# Patient Record
Sex: Female | Born: 1935
Health system: Southern US, Community
[De-identification: ages and names within clinical notes are randomized; demographics above are authoritative.]

## PROBLEM LIST (undated history)

## (undated) DIAGNOSIS — R519 Headache, unspecified: Secondary | ICD-10-CM

## (undated) DIAGNOSIS — K219 Gastro-esophageal reflux disease without esophagitis: Secondary | ICD-10-CM

## (undated) DIAGNOSIS — Z9889 Other specified postprocedural states: Secondary | ICD-10-CM

## (undated) DIAGNOSIS — IMO0001 Reserved for inherently not codable concepts without codable children: Secondary | ICD-10-CM

## (undated) DIAGNOSIS — I2510 Atherosclerotic heart disease of native coronary artery without angina pectoris: Secondary | ICD-10-CM

## (undated) DIAGNOSIS — I10 Essential (primary) hypertension: Secondary | ICD-10-CM

## (undated) DIAGNOSIS — Z8781 Personal history of (healed) traumatic fracture: Secondary | ICD-10-CM

## (undated) DIAGNOSIS — R112 Nausea with vomiting, unspecified: Secondary | ICD-10-CM

## (undated) DIAGNOSIS — M719 Bursopathy, unspecified: Secondary | ICD-10-CM

## (undated) DIAGNOSIS — I340 Nonrheumatic mitral (valve) insufficiency: Secondary | ICD-10-CM

## (undated) DIAGNOSIS — M1990 Unspecified osteoarthritis, unspecified site: Secondary | ICD-10-CM

## (undated) DIAGNOSIS — R51 Headache: Secondary | ICD-10-CM

## (undated) DIAGNOSIS — C50919 Malignant neoplasm of unspecified site of unspecified female breast: Secondary | ICD-10-CM

## (undated) DIAGNOSIS — E785 Hyperlipidemia, unspecified: Secondary | ICD-10-CM

## (undated) DIAGNOSIS — H269 Unspecified cataract: Secondary | ICD-10-CM

## (undated) DIAGNOSIS — K625 Hemorrhage of anus and rectum: Secondary | ICD-10-CM

## (undated) HISTORY — DX: Reserved for inherently not codable concepts without codable children: IMO0001

## (undated) HISTORY — DX: Malignant neoplasm of unspecified site of unspecified female breast: C50.919

## (undated) HISTORY — DX: Hemorrhage of anus and rectum: K62.5

## (undated) HISTORY — PX: DILATION AND CURETTAGE OF UTERUS: SHX78

## (undated) HISTORY — PX: TONSILLECTOMY: SUR1361

## (undated) HISTORY — PX: COLONOSCOPY: SHX174

## (undated) HISTORY — PX: CATARACT EXTRACTION, BILATERAL: SHX1313

## (undated) HISTORY — PX: EYE SURGERY: SHX253

## (undated) HISTORY — DX: Unspecified cataract: H26.9

## (undated) HISTORY — DX: Essential (primary) hypertension: I10

## (undated) HISTORY — DX: Nonrheumatic mitral (valve) insufficiency: I34.0

## (undated) HISTORY — PX: APPENDECTOMY: SHX54

## (undated) HISTORY — DX: Atherosclerotic heart disease of native coronary artery without angina pectoris: I25.10

## (undated) HISTORY — DX: Hyperlipidemia, unspecified: E78.5

---

## 1998-05-01 ENCOUNTER — Other Ambulatory Visit: Admission: RE | Admit: 1998-05-01 | Discharge: 1998-05-01 | Payer: Self-pay | Admitting: Obstetrics and Gynecology

## 1998-11-13 ENCOUNTER — Inpatient Hospital Stay (HOSPITAL_COMMUNITY): Admission: AD | Admit: 1998-11-13 | Discharge: 1998-11-17 | Payer: Self-pay | Admitting: Cardiovascular Disease

## 1998-11-14 ENCOUNTER — Encounter: Payer: Self-pay | Admitting: Cardiovascular Disease

## 1998-11-16 HISTORY — PX: CORONARY ANGIOPLASTY WITH STENT PLACEMENT: SHX49

## 1998-12-15 ENCOUNTER — Encounter (HOSPITAL_COMMUNITY): Admission: RE | Admit: 1998-12-15 | Discharge: 1999-03-15 | Payer: Self-pay | Admitting: Cardiovascular Disease

## 1999-01-27 ENCOUNTER — Encounter: Payer: Self-pay | Admitting: Emergency Medicine

## 1999-01-27 ENCOUNTER — Emergency Department (HOSPITAL_COMMUNITY): Admission: EM | Admit: 1999-01-27 | Discharge: 1999-01-27 | Payer: Self-pay | Admitting: Emergency Medicine

## 1999-03-16 ENCOUNTER — Encounter (HOSPITAL_COMMUNITY): Admission: RE | Admit: 1999-03-16 | Discharge: 1999-06-14 | Payer: Self-pay | Admitting: Cardiovascular Disease

## 1999-05-17 ENCOUNTER — Encounter (HOSPITAL_COMMUNITY): Admission: RE | Admit: 1999-05-17 | Discharge: 1999-08-15 | Payer: Self-pay | Admitting: Cardiovascular Disease

## 1999-05-28 ENCOUNTER — Other Ambulatory Visit: Admission: RE | Admit: 1999-05-28 | Discharge: 1999-05-28 | Payer: Self-pay | Admitting: Obstetrics and Gynecology

## 1999-10-18 ENCOUNTER — Encounter: Payer: Self-pay | Admitting: Obstetrics and Gynecology

## 1999-10-18 ENCOUNTER — Encounter: Admission: RE | Admit: 1999-10-18 | Discharge: 1999-10-18 | Payer: Self-pay | Admitting: Obstetrics and Gynecology

## 1999-11-10 ENCOUNTER — Ambulatory Visit (HOSPITAL_COMMUNITY): Admission: RE | Admit: 1999-11-10 | Discharge: 1999-11-10 | Payer: Self-pay | Admitting: Gastroenterology

## 1999-11-10 ENCOUNTER — Encounter (INDEPENDENT_AMBULATORY_CARE_PROVIDER_SITE_OTHER): Payer: Self-pay | Admitting: Specialist

## 2000-04-17 ENCOUNTER — Encounter: Admission: RE | Admit: 2000-04-17 | Discharge: 2000-04-17 | Payer: Self-pay | Admitting: Endocrinology

## 2000-04-17 ENCOUNTER — Encounter: Payer: Self-pay | Admitting: Endocrinology

## 2000-07-03 ENCOUNTER — Other Ambulatory Visit: Admission: RE | Admit: 2000-07-03 | Discharge: 2000-07-03 | Payer: Self-pay | Admitting: Obstetrics and Gynecology

## 2001-04-19 ENCOUNTER — Encounter: Payer: Self-pay | Admitting: Obstetrics and Gynecology

## 2001-04-19 ENCOUNTER — Encounter: Admission: RE | Admit: 2001-04-19 | Discharge: 2001-04-19 | Payer: Self-pay | Admitting: Obstetrics and Gynecology

## 2001-07-11 ENCOUNTER — Other Ambulatory Visit: Admission: RE | Admit: 2001-07-11 | Discharge: 2001-07-11 | Payer: Self-pay | Admitting: Obstetrics and Gynecology

## 2002-05-22 ENCOUNTER — Encounter: Payer: Self-pay | Admitting: Obstetrics and Gynecology

## 2002-05-22 ENCOUNTER — Encounter: Admission: RE | Admit: 2002-05-22 | Discharge: 2002-05-22 | Payer: Self-pay | Admitting: Obstetrics and Gynecology

## 2002-07-22 ENCOUNTER — Other Ambulatory Visit: Admission: RE | Admit: 2002-07-22 | Discharge: 2002-07-22 | Payer: Self-pay | Admitting: Obstetrics and Gynecology

## 2002-10-25 ENCOUNTER — Encounter: Admission: RE | Admit: 2002-10-25 | Discharge: 2002-10-25 | Payer: Self-pay | Admitting: Endocrinology

## 2002-10-25 ENCOUNTER — Encounter: Payer: Self-pay | Admitting: Endocrinology

## 2003-01-23 ENCOUNTER — Encounter: Payer: Self-pay | Admitting: Endocrinology

## 2003-01-23 ENCOUNTER — Ambulatory Visit (HOSPITAL_COMMUNITY): Admission: RE | Admit: 2003-01-23 | Discharge: 2003-01-23 | Payer: Self-pay | Admitting: Endocrinology

## 2003-06-10 ENCOUNTER — Encounter: Admission: RE | Admit: 2003-06-10 | Discharge: 2003-06-10 | Payer: Self-pay | Admitting: Obstetrics and Gynecology

## 2003-06-10 ENCOUNTER — Encounter: Payer: Self-pay | Admitting: Obstetrics and Gynecology

## 2003-10-28 ENCOUNTER — Other Ambulatory Visit: Admission: RE | Admit: 2003-10-28 | Discharge: 2003-10-28 | Payer: Self-pay | Admitting: Obstetrics and Gynecology

## 2004-06-17 ENCOUNTER — Encounter: Admission: RE | Admit: 2004-06-17 | Discharge: 2004-06-17 | Payer: Self-pay | Admitting: Obstetrics and Gynecology

## 2005-09-01 ENCOUNTER — Encounter: Admission: RE | Admit: 2005-09-01 | Discharge: 2005-09-01 | Payer: Self-pay | Admitting: Obstetrics and Gynecology

## 2006-09-12 ENCOUNTER — Encounter: Admission: RE | Admit: 2006-09-12 | Discharge: 2006-09-12 | Payer: Self-pay | Admitting: Obstetrics and Gynecology

## 2007-09-21 ENCOUNTER — Encounter: Admission: RE | Admit: 2007-09-21 | Discharge: 2007-09-21 | Payer: Self-pay | Admitting: Obstetrics and Gynecology

## 2007-09-28 ENCOUNTER — Encounter: Admission: RE | Admit: 2007-09-28 | Discharge: 2007-09-28 | Payer: Self-pay | Admitting: Obstetrics and Gynecology

## 2008-10-01 ENCOUNTER — Encounter: Admission: RE | Admit: 2008-10-01 | Discharge: 2008-10-01 | Payer: Self-pay | Admitting: Obstetrics and Gynecology

## 2009-07-13 HISTORY — PX: NM MYOCAR PERF WALL MOTION: HXRAD629

## 2009-10-12 ENCOUNTER — Encounter: Admission: RE | Admit: 2009-10-12 | Discharge: 2009-10-12 | Payer: Self-pay | Admitting: Obstetrics and Gynecology

## 2010-09-26 ENCOUNTER — Encounter: Payer: Self-pay | Admitting: Obstetrics and Gynecology

## 2010-09-29 ENCOUNTER — Other Ambulatory Visit: Payer: Self-pay | Admitting: Endocrinology

## 2010-09-29 DIAGNOSIS — Z1231 Encounter for screening mammogram for malignant neoplasm of breast: Secondary | ICD-10-CM

## 2010-09-29 DIAGNOSIS — Z1239 Encounter for other screening for malignant neoplasm of breast: Secondary | ICD-10-CM

## 2010-10-13 ENCOUNTER — Ambulatory Visit
Admission: RE | Admit: 2010-10-13 | Discharge: 2010-10-13 | Disposition: A | Discharge status: Home / Self Care | Payer: Medicare Other | Source: Ambulatory Visit | Attending: Endocrinology | Admitting: Endocrinology

## 2010-10-13 DIAGNOSIS — Z1231 Encounter for screening mammogram for malignant neoplasm of breast: Secondary | ICD-10-CM

## 2010-10-20 ENCOUNTER — Other Ambulatory Visit: Payer: Self-pay | Admitting: Endocrinology

## 2010-10-20 DIAGNOSIS — R928 Other abnormal and inconclusive findings on diagnostic imaging of breast: Secondary | ICD-10-CM

## 2010-11-09 ENCOUNTER — Ambulatory Visit
Admission: RE | Admit: 2010-11-09 | Discharge: 2010-11-09 | Disposition: A | Discharge status: Home / Self Care | Payer: Medicare Other | Source: Ambulatory Visit | Attending: Endocrinology | Admitting: Endocrinology

## 2010-11-09 DIAGNOSIS — R928 Other abnormal and inconclusive findings on diagnostic imaging of breast: Secondary | ICD-10-CM

## 2011-09-23 DIAGNOSIS — M79609 Pain in unspecified limb: Secondary | ICD-10-CM | POA: Diagnosis not present

## 2011-10-06 DIAGNOSIS — H02839 Dermatochalasis of unspecified eye, unspecified eyelid: Secondary | ICD-10-CM | POA: Diagnosis not present

## 2011-10-06 DIAGNOSIS — H53459 Other localized visual field defect, unspecified eye: Secondary | ICD-10-CM | POA: Diagnosis not present

## 2011-10-10 ENCOUNTER — Other Ambulatory Visit: Payer: Self-pay | Admitting: Obstetrics and Gynecology

## 2011-10-10 DIAGNOSIS — Z1231 Encounter for screening mammogram for malignant neoplasm of breast: Secondary | ICD-10-CM

## 2011-10-27 ENCOUNTER — Ambulatory Visit
Admission: RE | Admit: 2011-10-27 | Discharge: 2011-10-27 | Disposition: A | Discharge status: Home / Self Care | Payer: Medicare Other | Source: Ambulatory Visit | Attending: Obstetrics and Gynecology | Admitting: Obstetrics and Gynecology

## 2011-10-27 DIAGNOSIS — Z1231 Encounter for screening mammogram for malignant neoplasm of breast: Secondary | ICD-10-CM

## 2011-12-13 DIAGNOSIS — I10 Essential (primary) hypertension: Secondary | ICD-10-CM | POA: Diagnosis not present

## 2011-12-13 DIAGNOSIS — M899 Disorder of bone, unspecified: Secondary | ICD-10-CM | POA: Diagnosis not present

## 2011-12-13 DIAGNOSIS — E785 Hyperlipidemia, unspecified: Secondary | ICD-10-CM | POA: Diagnosis not present

## 2011-12-16 DIAGNOSIS — L0291 Cutaneous abscess, unspecified: Secondary | ICD-10-CM | POA: Diagnosis not present

## 2011-12-16 DIAGNOSIS — L03319 Cellulitis of trunk, unspecified: Secondary | ICD-10-CM | POA: Diagnosis not present

## 2011-12-16 DIAGNOSIS — L02219 Cutaneous abscess of trunk, unspecified: Secondary | ICD-10-CM | POA: Diagnosis not present

## 2011-12-16 DIAGNOSIS — L0390 Cellulitis, unspecified: Secondary | ICD-10-CM | POA: Diagnosis not present

## 2011-12-16 DIAGNOSIS — I259 Chronic ischemic heart disease, unspecified: Secondary | ICD-10-CM | POA: Diagnosis not present

## 2011-12-21 DIAGNOSIS — I10 Essential (primary) hypertension: Secondary | ICD-10-CM | POA: Diagnosis not present

## 2011-12-21 DIAGNOSIS — L02219 Cutaneous abscess of trunk, unspecified: Secondary | ICD-10-CM | POA: Diagnosis not present

## 2011-12-21 DIAGNOSIS — I259 Chronic ischemic heart disease, unspecified: Secondary | ICD-10-CM | POA: Diagnosis not present

## 2011-12-21 DIAGNOSIS — Z0000 Encounter for general adult medical examination without abnormal findings: Secondary | ICD-10-CM | POA: Diagnosis not present

## 2011-12-21 DIAGNOSIS — R7309 Other abnormal glucose: Secondary | ICD-10-CM | POA: Diagnosis not present

## 2011-12-28 DIAGNOSIS — R1084 Generalized abdominal pain: Secondary | ICD-10-CM | POA: Diagnosis not present

## 2012-01-13 DIAGNOSIS — L940 Localized scleroderma [morphea]: Secondary | ICD-10-CM | POA: Diagnosis not present

## 2012-01-13 DIAGNOSIS — L538 Other specified erythematous conditions: Secondary | ICD-10-CM | POA: Diagnosis not present

## 2012-01-13 DIAGNOSIS — L821 Other seborrheic keratosis: Secondary | ICD-10-CM | POA: Diagnosis not present

## 2012-01-13 DIAGNOSIS — D239 Other benign neoplasm of skin, unspecified: Secondary | ICD-10-CM | POA: Diagnosis not present

## 2012-02-16 DIAGNOSIS — E782 Mixed hyperlipidemia: Secondary | ICD-10-CM | POA: Diagnosis not present

## 2012-02-16 DIAGNOSIS — I2510 Atherosclerotic heart disease of native coronary artery without angina pectoris: Secondary | ICD-10-CM | POA: Diagnosis not present

## 2012-05-13 DIAGNOSIS — Z23 Encounter for immunization: Secondary | ICD-10-CM | POA: Diagnosis not present

## 2012-05-14 DIAGNOSIS — R300 Dysuria: Secondary | ICD-10-CM | POA: Diagnosis not present

## 2012-05-14 DIAGNOSIS — R82998 Other abnormal findings in urine: Secondary | ICD-10-CM | POA: Diagnosis not present

## 2012-05-18 DIAGNOSIS — E782 Mixed hyperlipidemia: Secondary | ICD-10-CM | POA: Diagnosis not present

## 2012-07-10 DIAGNOSIS — R7309 Other abnormal glucose: Secondary | ICD-10-CM | POA: Diagnosis not present

## 2012-07-10 DIAGNOSIS — L940 Localized scleroderma [morphea]: Secondary | ICD-10-CM | POA: Diagnosis not present

## 2012-07-10 DIAGNOSIS — M949 Disorder of cartilage, unspecified: Secondary | ICD-10-CM | POA: Diagnosis not present

## 2012-07-10 DIAGNOSIS — M899 Disorder of bone, unspecified: Secondary | ICD-10-CM | POA: Diagnosis not present

## 2012-07-10 DIAGNOSIS — I259 Chronic ischemic heart disease, unspecified: Secondary | ICD-10-CM | POA: Diagnosis not present

## 2012-07-19 DIAGNOSIS — Z23 Encounter for immunization: Secondary | ICD-10-CM | POA: Diagnosis not present

## 2012-07-20 DIAGNOSIS — R82998 Other abnormal findings in urine: Secondary | ICD-10-CM | POA: Diagnosis not present

## 2012-07-20 DIAGNOSIS — R300 Dysuria: Secondary | ICD-10-CM | POA: Diagnosis not present

## 2012-07-27 DIAGNOSIS — Z124 Encounter for screening for malignant neoplasm of cervix: Secondary | ICD-10-CM | POA: Diagnosis not present

## 2012-07-27 DIAGNOSIS — Z01419 Encounter for gynecological examination (general) (routine) without abnormal findings: Secondary | ICD-10-CM | POA: Diagnosis not present

## 2012-08-08 DIAGNOSIS — R82998 Other abnormal findings in urine: Secondary | ICD-10-CM | POA: Diagnosis not present

## 2012-08-08 DIAGNOSIS — R300 Dysuria: Secondary | ICD-10-CM | POA: Diagnosis not present

## 2012-08-24 ENCOUNTER — Other Ambulatory Visit (HOSPITAL_COMMUNITY): Payer: Self-pay | Admitting: Cardiovascular Disease

## 2012-08-24 DIAGNOSIS — Z79899 Other long term (current) drug therapy: Secondary | ICD-10-CM | POA: Diagnosis not present

## 2012-08-24 DIAGNOSIS — R5383 Other fatigue: Secondary | ICD-10-CM | POA: Diagnosis not present

## 2012-08-24 DIAGNOSIS — E782 Mixed hyperlipidemia: Secondary | ICD-10-CM | POA: Diagnosis not present

## 2012-08-24 DIAGNOSIS — R0989 Other specified symptoms and signs involving the circulatory and respiratory systems: Secondary | ICD-10-CM

## 2012-08-24 DIAGNOSIS — I2510 Atherosclerotic heart disease of native coronary artery without angina pectoris: Secondary | ICD-10-CM | POA: Diagnosis not present

## 2012-08-24 DIAGNOSIS — I10 Essential (primary) hypertension: Secondary | ICD-10-CM | POA: Diagnosis not present

## 2012-09-10 ENCOUNTER — Ambulatory Visit (HOSPITAL_COMMUNITY)
Admission: RE | Admit: 2012-09-10 | Discharge: 2012-09-10 | Disposition: A | Discharge status: Home / Self Care | Payer: Medicare Other | Source: Ambulatory Visit | Attending: Cardiovascular Disease | Admitting: Cardiovascular Disease

## 2012-09-10 DIAGNOSIS — R0989 Other specified symptoms and signs involving the circulatory and respiratory systems: Secondary | ICD-10-CM | POA: Diagnosis not present

## 2012-09-10 NOTE — Progress Notes (Signed)
Carotid duplex completed. Jasmin Holloway  

## 2012-10-01 ENCOUNTER — Other Ambulatory Visit: Payer: Self-pay | Admitting: Endocrinology

## 2012-10-01 DIAGNOSIS — Z1231 Encounter for screening mammogram for malignant neoplasm of breast: Secondary | ICD-10-CM

## 2012-11-06 ENCOUNTER — Ambulatory Visit
Admission: RE | Admit: 2012-11-06 | Discharge: 2012-11-06 | Disposition: A | Discharge status: Home / Self Care | Payer: Medicare Other | Source: Ambulatory Visit | Attending: Endocrinology | Admitting: Endocrinology

## 2012-11-06 DIAGNOSIS — Z1231 Encounter for screening mammogram for malignant neoplasm of breast: Secondary | ICD-10-CM | POA: Diagnosis not present

## 2012-11-21 DIAGNOSIS — H52209 Unspecified astigmatism, unspecified eye: Secondary | ICD-10-CM | POA: Diagnosis not present

## 2012-11-21 DIAGNOSIS — H524 Presbyopia: Secondary | ICD-10-CM | POA: Diagnosis not present

## 2012-11-21 DIAGNOSIS — Z961 Presence of intraocular lens: Secondary | ICD-10-CM | POA: Diagnosis not present

## 2012-12-17 DIAGNOSIS — M899 Disorder of bone, unspecified: Secondary | ICD-10-CM | POA: Diagnosis not present

## 2012-12-17 DIAGNOSIS — R82998 Other abnormal findings in urine: Secondary | ICD-10-CM | POA: Diagnosis not present

## 2012-12-17 DIAGNOSIS — I10 Essential (primary) hypertension: Secondary | ICD-10-CM | POA: Diagnosis not present

## 2012-12-17 DIAGNOSIS — E785 Hyperlipidemia, unspecified: Secondary | ICD-10-CM | POA: Diagnosis not present

## 2012-12-25 DIAGNOSIS — H6120 Impacted cerumen, unspecified ear: Secondary | ICD-10-CM | POA: Diagnosis not present

## 2012-12-25 DIAGNOSIS — Z0000 Encounter for general adult medical examination without abnormal findings: Secondary | ICD-10-CM | POA: Diagnosis not present

## 2012-12-25 DIAGNOSIS — L940 Localized scleroderma [morphea]: Secondary | ICD-10-CM | POA: Diagnosis not present

## 2012-12-25 DIAGNOSIS — I10 Essential (primary) hypertension: Secondary | ICD-10-CM | POA: Diagnosis not present

## 2012-12-25 DIAGNOSIS — N39498 Other specified urinary incontinence: Secondary | ICD-10-CM | POA: Diagnosis not present

## 2012-12-25 DIAGNOSIS — R7309 Other abnormal glucose: Secondary | ICD-10-CM | POA: Diagnosis not present

## 2012-12-25 DIAGNOSIS — D126 Benign neoplasm of colon, unspecified: Secondary | ICD-10-CM | POA: Diagnosis not present

## 2012-12-25 DIAGNOSIS — I259 Chronic ischemic heart disease, unspecified: Secondary | ICD-10-CM | POA: Diagnosis not present

## 2012-12-26 DIAGNOSIS — Z1212 Encounter for screening for malignant neoplasm of rectum: Secondary | ICD-10-CM | POA: Diagnosis not present

## 2013-01-14 DIAGNOSIS — D239 Other benign neoplasm of skin, unspecified: Secondary | ICD-10-CM | POA: Diagnosis not present

## 2013-01-14 DIAGNOSIS — D2360 Other benign neoplasm of skin of unspecified upper limb, including shoulder: Secondary | ICD-10-CM | POA: Diagnosis not present

## 2013-01-14 DIAGNOSIS — Z85828 Personal history of other malignant neoplasm of skin: Secondary | ICD-10-CM | POA: Diagnosis not present

## 2013-01-14 DIAGNOSIS — L819 Disorder of pigmentation, unspecified: Secondary | ICD-10-CM | POA: Diagnosis not present

## 2013-01-14 DIAGNOSIS — L820 Inflamed seborrheic keratosis: Secondary | ICD-10-CM | POA: Diagnosis not present

## 2013-01-14 DIAGNOSIS — D0470 Carcinoma in situ of skin of unspecified lower limb, including hip: Secondary | ICD-10-CM | POA: Diagnosis not present

## 2013-01-14 DIAGNOSIS — L565 Disseminated superficial actinic porokeratosis (DSAP): Secondary | ICD-10-CM | POA: Diagnosis not present

## 2013-01-14 DIAGNOSIS — L940 Localized scleroderma [morphea]: Secondary | ICD-10-CM | POA: Diagnosis not present

## 2013-01-14 DIAGNOSIS — D485 Neoplasm of uncertain behavior of skin: Secondary | ICD-10-CM | POA: Diagnosis not present

## 2013-01-14 DIAGNOSIS — D045 Carcinoma in situ of skin of trunk: Secondary | ICD-10-CM | POA: Diagnosis not present

## 2013-01-23 ENCOUNTER — Telehealth: Payer: Self-pay | Admitting: Cardiovascular Disease

## 2013-01-23 DIAGNOSIS — Z780 Asymptomatic menopausal state: Secondary | ICD-10-CM | POA: Diagnosis not present

## 2013-01-23 DIAGNOSIS — M899 Disorder of bone, unspecified: Secondary | ICD-10-CM | POA: Diagnosis not present

## 2013-01-23 NOTE — Telephone Encounter (Signed)
Returned call. Left message to call back.

## 2013-01-23 NOTE — Telephone Encounter (Signed)
Jasmin Holloway has some questions about her medication; Pantoprazole sodium and the pharmacist states that they cannot get the medication anymore and she wants to know what can she do. Please call her at [phone]  Thanks

## 2013-01-24 MED ORDER — PANTOPRAZOLE SODIUM 40 MG PO TBEC: 40.0000 mg | DELAYED_RELEASE_TABLET | Freq: Every day | ORAL | Status: AC

## 2013-01-24 NOTE — Telephone Encounter (Signed)
Returned call.  Pt stated she cannot get pantoprazole anymore with [facility] because they cannot get it with the supplier anymore.  Pt requesting an alternative or a rx at [facility] on [address] [facility]).  E-Rx sent via [employer].

## 2013-03-12 ENCOUNTER — Other Ambulatory Visit: Payer: Self-pay

## 2013-03-12 ENCOUNTER — Other Ambulatory Visit: Payer: Self-pay | Admitting: *Deleted

## 2013-03-12 DIAGNOSIS — I10 Essential (primary) hypertension: Secondary | ICD-10-CM | POA: Diagnosis not present

## 2013-03-12 DIAGNOSIS — E782 Mixed hyperlipidemia: Secondary | ICD-10-CM | POA: Diagnosis not present

## 2013-03-12 DIAGNOSIS — I2510 Atherosclerotic heart disease of native coronary artery without angina pectoris: Secondary | ICD-10-CM | POA: Diagnosis not present

## 2013-03-12 DIAGNOSIS — R011 Cardiac murmur, unspecified: Secondary | ICD-10-CM

## 2013-03-12 MED ORDER — ATORVASTATIN CALCIUM 40 MG PO TABS: 40.0000 mg | ORAL_TABLET | Freq: Every day | ORAL | Status: DC

## 2013-03-12 NOTE — Telephone Encounter (Signed)
Rx was sent to pharmacy electronically via [employer].  

## 2013-03-19 ENCOUNTER — Telehealth: Payer: Self-pay | Admitting: Cardiovascular Disease

## 2013-03-19 NOTE — Telephone Encounter (Signed)
Please call-she needs to talk to you about her medicine!

## 2013-03-22 NOTE — Telephone Encounter (Signed)
Pt states she is receiving her medication now

## 2013-03-23 ENCOUNTER — Encounter: Payer: Self-pay | Admitting: Cardiovascular Disease

## 2013-03-29 ENCOUNTER — Ambulatory Visit (HOSPITAL_COMMUNITY)
Admission: RE | Admit: 2013-03-29 | Discharge: 2013-03-29 | Disposition: A | Discharge status: Home / Self Care | Payer: Medicare Other | Source: Ambulatory Visit | Attending: Cardiovascular Disease | Admitting: Cardiovascular Disease

## 2013-03-29 DIAGNOSIS — I2510 Atherosclerotic heart disease of native coronary artery without angina pectoris: Secondary | ICD-10-CM | POA: Diagnosis not present

## 2013-03-29 DIAGNOSIS — R011 Cardiac murmur, unspecified: Secondary | ICD-10-CM | POA: Diagnosis not present

## 2013-05-10 DIAGNOSIS — N390 Urinary tract infection, site not specified: Secondary | ICD-10-CM | POA: Diagnosis not present

## 2013-05-10 DIAGNOSIS — R3129 Other microscopic hematuria: Secondary | ICD-10-CM | POA: Diagnosis not present

## 2013-05-29 ENCOUNTER — Telehealth: Payer: Self-pay | Admitting: Cardiovascular Disease

## 2013-05-29 DIAGNOSIS — Z23 Encounter for immunization: Secondary | ICD-10-CM | POA: Diagnosis not present

## 2013-05-29 NOTE — Telephone Encounter (Signed)
Please call-just need to talk to you about Dr Alanda Amass leaving and who she needs to see.

## 2013-05-30 NOTE — Telephone Encounter (Signed)
Pt. Called and informed of the intentions of the practice to assighn her to another cardiololgist

## 2013-06-28 DIAGNOSIS — R7309 Other abnormal glucose: Secondary | ICD-10-CM | POA: Diagnosis not present

## 2013-06-28 DIAGNOSIS — R300 Dysuria: Secondary | ICD-10-CM | POA: Diagnosis not present

## 2013-06-28 DIAGNOSIS — E785 Hyperlipidemia, unspecified: Secondary | ICD-10-CM | POA: Diagnosis not present

## 2013-06-28 DIAGNOSIS — K219 Gastro-esophageal reflux disease without esophagitis: Secondary | ICD-10-CM | POA: Diagnosis not present

## 2013-06-28 DIAGNOSIS — R82998 Other abnormal findings in urine: Secondary | ICD-10-CM | POA: Diagnosis not present

## 2013-06-28 DIAGNOSIS — I10 Essential (primary) hypertension: Secondary | ICD-10-CM | POA: Diagnosis not present

## 2013-06-28 DIAGNOSIS — D126 Benign neoplasm of colon, unspecified: Secondary | ICD-10-CM | POA: Diagnosis not present

## 2013-06-28 DIAGNOSIS — I259 Chronic ischemic heart disease, unspecified: Secondary | ICD-10-CM | POA: Diagnosis not present

## 2013-06-28 DIAGNOSIS — M899 Disorder of bone, unspecified: Secondary | ICD-10-CM | POA: Diagnosis not present

## 2013-07-01 DIAGNOSIS — K5730 Diverticulosis of large intestine without perforation or abscess without bleeding: Secondary | ICD-10-CM | POA: Diagnosis not present

## 2013-07-01 DIAGNOSIS — K648 Other hemorrhoids: Secondary | ICD-10-CM | POA: Diagnosis not present

## 2013-07-01 DIAGNOSIS — Z1211 Encounter for screening for malignant neoplasm of colon: Secondary | ICD-10-CM | POA: Diagnosis not present

## 2013-07-01 DIAGNOSIS — Z86010 Personal history of colonic polyps: Secondary | ICD-10-CM | POA: Diagnosis not present

## 2013-07-17 DIAGNOSIS — Z85828 Personal history of other malignant neoplasm of skin: Secondary | ICD-10-CM | POA: Diagnosis not present

## 2013-07-17 DIAGNOSIS — L940 Localized scleroderma [morphea]: Secondary | ICD-10-CM | POA: Diagnosis not present

## 2013-10-08 ENCOUNTER — Other Ambulatory Visit: Payer: Self-pay

## 2013-10-08 DIAGNOSIS — Z1231 Encounter for screening mammogram for malignant neoplasm of breast: Secondary | ICD-10-CM

## 2013-10-14 ENCOUNTER — Other Ambulatory Visit: Payer: Self-pay

## 2013-10-14 MED ORDER — BENAZEPRIL-HYDROCHLOROTHIAZIDE 10-12.5 MG PO TABS: 1.0000 | ORAL_TABLET | Freq: Every day | ORAL | Status: DC

## 2013-10-14 MED ORDER — METOPROLOL TARTRATE 25 MG PO TABS: 25.0000 mg | ORAL_TABLET | Freq: Two times a day (BID) | ORAL | Status: DC

## 2013-10-14 NOTE — Telephone Encounter (Signed)
Rx was sent to pharmacy electronically. 

## 2013-11-08 ENCOUNTER — Ambulatory Visit (INDEPENDENT_AMBULATORY_CARE_PROVIDER_SITE_OTHER): Payer: Medicare Other | Admitting: Cardiovascular Disease

## 2013-11-08 ENCOUNTER — Encounter: Payer: Self-pay | Admitting: Cardiovascular Disease

## 2013-11-08 VITALS — BP 108/72 | HR 65 | Ht 64.0 in | Wt 142.0 lb

## 2013-11-08 DIAGNOSIS — I2510 Atherosclerotic heart disease of native coronary artery without angina pectoris: Secondary | ICD-10-CM | POA: Insufficient documentation

## 2013-11-08 DIAGNOSIS — I10 Essential (primary) hypertension: Secondary | ICD-10-CM | POA: Diagnosis not present

## 2013-11-08 DIAGNOSIS — E785 Hyperlipidemia, unspecified: Secondary | ICD-10-CM

## 2013-11-08 NOTE — Patient Instructions (Signed)
Your physician recommends that you schedule a follow-up appointment in 6 months with an extender.  Dr Berry wants you to follow-up in 1 year. You will receive a reminder letter in the mail two months in advance. If you don't receive a letter, please call our office to schedule the follow-up appointment. 

## 2013-11-08 NOTE — Progress Notes (Signed)
11/08/2013 Jasmin Holloway   [birth year]  [id number]  Primary Physician Sheela Stack, MD Primary Cardiologist: Lorretta Harp MD Renae Gloss   HPI:  Ms. Behler is a delightful 80 year old thin appearing married Caucasian female mother of 2 children, grandmother to 2 grandchildren whose husband Iona Beard is also a patient of mine. Before. The patient's of Dr. Terance Ice. She has a history of CAD status post LAD/diagonal branch intervention by Dr. Rollene Fare back in March of 2000. She has not had an procedure since. Her last Myoview performed in 2000 and was nonischemic. She is completely asymptomatic is very active. She works at [employer] 3 days a week and does aerobic exercise at [employer] 2 days a week. Her other problems include history of treated hypertension and hyperlipidemia.   Current Outpatient Prescriptions  Medication Sig Dispense Refill  . aspirin 81 MG tablet Take 81 mg by mouth daily.      Marland Kitchen atorvastatin (LIPITOR) 40 MG tablet Take 1 tablet (40 mg total) by mouth daily.  90 tablet  3  . benazepril-hydrochlorthiazide (LOTENSIN HCT) 10-12.5 MG per tablet Take 1 tablet by mouth daily.  92 tablet  1  . Biotin 5000 MCG CAPS Take 1 capsule by mouth daily.      . cholecalciferol (VITAMIN D) 1000 UNITS tablet Take 1,000 Units by mouth daily.      Marland Kitchen EVENING PRIMROSE OIL PO Take 1 tablet by mouth daily.      . metoprolol tartrate (LOPRESSOR) 25 MG tablet Take 1 tablet (25 mg total) by mouth 2 (two) times daily.  180 tablet  1  . Misc Natural Products (OSTEO BI-FLEX JOINT SHIELD) TABS Take 2 tablets by mouth daily.      . Multiple Minerals-Vitamins (CALCIUM CITRATE PLUS PO) Take 1 tablet by mouth 2 (two) times daily.      . Multiple Vitamin (MULTI VITAMIN DAILY PO) Take 1 tablet by mouth daily.      . Omega-3 Fatty Acids (FISH OIL) 1200 MG CPDR Take 1 tablet by mouth daily.      . pantoprazole (PROTONIX) 40 MG tablet Take 1 tablet (40 mg total) by  mouth daily.  30 tablet  1  . pseudoephedrine-acetaminophen (TYLENOL SINUS) 30-500 MG TABS Take 1 tablet by mouth every 4 (four) hours as needed.      . vitamin C (ASCORBIC ACID) 500 MG tablet Take 500 mg by mouth daily.       No current facility-administered medications for this visit.    Not on File  History   Social History  . Marital Status: Married    Spouse Name: N/A    Number of Children: N/A  . Years of Education: N/A   Occupational History  . Not on file.   Social History Main Topics  . Smoking status: Never Smoker   . Smokeless tobacco: Not on file  . Alcohol Use: Not on file  . Drug Use: Not on file  . Sexual Activity: Not on file   Other Topics Concern  . Not on file   Social History Narrative  . No narrative on file     Review of Systems: General: negative for chills, fever, night sweats or weight changes.  Cardiovascular: negative for chest pain, dyspnea on exertion, edema, orthopnea, palpitations, paroxysmal nocturnal dyspnea or shortness of breath Dermatological: negative for rash Respiratory: negative for cough or wheezing Urologic: negative for hematuria Abdominal: negative for nausea, vomiting, diarrhea, bright red blood per rectum, melena,  or hematemesis Neurologic: negative for visual changes, syncope, or dizziness All other systems reviewed and are otherwise negative except as noted above.    Blood pressure 108/72, pulse 65, height 5\' 4"  (1.626 m), weight 64.411 kg (142 lb).  General appearance: alert and no distress Neck: no adenopathy, no carotid bruit, no JVD, supple, symmetrical, trachea midline and thyroid not enlarged, symmetric, no tenderness/mass/nodules Lungs: clear to auscultation bilaterally Heart: regular rate and rhythm, S1, S2 normal, no murmur, click, rub or gallop Abdomen: soft, non-tender; bowel sounds normal; no masses,  no organomegaly Extremities: extremities normal, atraumatic, no cyanosis or edema and 2+ pedal pulses  bilaterally  EKG normal sinus rhythm at 65 without ST or T wave changes  ASSESSMENT AND PLAN:   Coronary artery disease History of LAD/diagonal branch bifurcation stenting by Dr. Terance Ice 11/06/1998. She's not had a followup cath since. Her last Myoview performed 07/13/09 with nonischemic. She is active and denies symptoms.  Essential hypertension Well-controlled on current medications  Hyperlipidemia On statin therapy followed by her PCP Dr. Volney Presser MD [facility] 11/08/2013 8:47 AM

## 2013-11-08 NOTE — Assessment & Plan Note (Addendum)
On statin therapy followed by her PCP Dr. Forde Dandy

## 2013-11-08 NOTE — Assessment & Plan Note (Signed)
History of LAD/diagonal branch bifurcation stenting by Dr. Terance Ice 11/06/1998. She's not had a followup cath since. Her last Myoview performed 07/13/09 with nonischemic. She is active and denies symptoms.

## 2013-11-08 NOTE — Assessment & Plan Note (Signed)
Well-controlled on current medications 

## 2013-11-11 ENCOUNTER — Ambulatory Visit: Payer: Medicare Other

## 2013-11-25 ENCOUNTER — Ambulatory Visit
Admission: RE | Admit: 2013-11-25 | Discharge: 2013-11-25 | Disposition: A | Discharge status: Home / Self Care | Payer: Medicare Other | Source: Ambulatory Visit

## 2013-11-25 DIAGNOSIS — Z1231 Encounter for screening mammogram for malignant neoplasm of breast: Secondary | ICD-10-CM

## 2013-11-28 ENCOUNTER — Other Ambulatory Visit: Payer: Self-pay | Admitting: Endocrinology

## 2013-11-28 DIAGNOSIS — R928 Other abnormal and inconclusive findings on diagnostic imaging of breast: Secondary | ICD-10-CM

## 2013-12-19 ENCOUNTER — Other Ambulatory Visit: Payer: Medicare Other

## 2013-12-20 ENCOUNTER — Ambulatory Visit
Admission: RE | Admit: 2013-12-20 | Discharge: 2013-12-20 | Disposition: A | Discharge status: Home / Self Care | Payer: Medicare Other | Source: Ambulatory Visit | Attending: Endocrinology | Admitting: Endocrinology

## 2013-12-20 DIAGNOSIS — R928 Other abnormal and inconclusive findings on diagnostic imaging of breast: Secondary | ICD-10-CM

## 2013-12-20 DIAGNOSIS — N6009 Solitary cyst of unspecified breast: Secondary | ICD-10-CM | POA: Diagnosis not present

## 2013-12-23 DIAGNOSIS — M899 Disorder of bone, unspecified: Secondary | ICD-10-CM | POA: Diagnosis not present

## 2013-12-23 DIAGNOSIS — I10 Essential (primary) hypertension: Secondary | ICD-10-CM | POA: Diagnosis not present

## 2013-12-23 DIAGNOSIS — R82998 Other abnormal findings in urine: Secondary | ICD-10-CM | POA: Diagnosis not present

## 2013-12-23 DIAGNOSIS — M949 Disorder of cartilage, unspecified: Secondary | ICD-10-CM | POA: Diagnosis not present

## 2013-12-23 DIAGNOSIS — E785 Hyperlipidemia, unspecified: Secondary | ICD-10-CM | POA: Diagnosis not present

## 2013-12-27 DIAGNOSIS — I10 Essential (primary) hypertension: Secondary | ICD-10-CM | POA: Diagnosis not present

## 2013-12-27 DIAGNOSIS — R7309 Other abnormal glucose: Secondary | ICD-10-CM | POA: Diagnosis not present

## 2013-12-27 DIAGNOSIS — Z1331 Encounter for screening for depression: Secondary | ICD-10-CM | POA: Diagnosis not present

## 2013-12-27 DIAGNOSIS — M949 Disorder of cartilage, unspecified: Secondary | ICD-10-CM | POA: Diagnosis not present

## 2013-12-27 DIAGNOSIS — L940 Localized scleroderma [morphea]: Secondary | ICD-10-CM | POA: Diagnosis not present

## 2013-12-27 DIAGNOSIS — M899 Disorder of bone, unspecified: Secondary | ICD-10-CM | POA: Diagnosis not present

## 2013-12-27 DIAGNOSIS — Z0000 Encounter for general adult medical examination without abnormal findings: Secondary | ICD-10-CM | POA: Diagnosis not present

## 2013-12-27 DIAGNOSIS — I259 Chronic ischemic heart disease, unspecified: Secondary | ICD-10-CM | POA: Diagnosis not present

## 2013-12-27 DIAGNOSIS — E785 Hyperlipidemia, unspecified: Secondary | ICD-10-CM | POA: Diagnosis not present

## 2013-12-27 DIAGNOSIS — D126 Benign neoplasm of colon, unspecified: Secondary | ICD-10-CM | POA: Diagnosis not present

## 2013-12-30 DIAGNOSIS — Z1212 Encounter for screening for malignant neoplasm of rectum: Secondary | ICD-10-CM | POA: Diagnosis not present

## 2013-12-30 LAB — IFOBT (OCCULT BLOOD): IMMUNOLOGICAL FECAL OCCULT BLOOD TEST: NEGATIVE

## 2014-01-28 DIAGNOSIS — D239 Other benign neoplasm of skin, unspecified: Secondary | ICD-10-CM | POA: Diagnosis not present

## 2014-01-28 DIAGNOSIS — L659 Nonscarring hair loss, unspecified: Secondary | ICD-10-CM | POA: Diagnosis not present

## 2014-01-28 DIAGNOSIS — D485 Neoplasm of uncertain behavior of skin: Secondary | ICD-10-CM | POA: Diagnosis not present

## 2014-01-28 DIAGNOSIS — Z85828 Personal history of other malignant neoplasm of skin: Secondary | ICD-10-CM | POA: Diagnosis not present

## 2014-01-28 DIAGNOSIS — L940 Localized scleroderma [morphea]: Secondary | ICD-10-CM | POA: Diagnosis not present

## 2014-01-28 DIAGNOSIS — L570 Actinic keratosis: Secondary | ICD-10-CM | POA: Diagnosis not present

## 2014-01-28 DIAGNOSIS — L821 Other seborrheic keratosis: Secondary | ICD-10-CM | POA: Diagnosis not present

## 2014-01-28 DIAGNOSIS — L538 Other specified erythematous conditions: Secondary | ICD-10-CM | POA: Diagnosis not present

## 2014-04-20 ENCOUNTER — Other Ambulatory Visit: Payer: Self-pay | Admitting: Cardiovascular Disease

## 2014-04-21 NOTE — Telephone Encounter (Signed)
Rx was sent to pharmacy electronically. 

## 2014-04-22 ENCOUNTER — Telehealth: Payer: Self-pay | Admitting: Cardiology

## 2014-04-24 NOTE — Telephone Encounter (Signed)
Close encounter 

## 2014-04-26 DIAGNOSIS — IMO0002 Reserved for concepts with insufficient information to code with codable children: Secondary | ICD-10-CM | POA: Diagnosis not present

## 2014-05-01 ENCOUNTER — Telehealth: Payer: Self-pay | Admitting: Cardiovascular Disease

## 2014-05-01 NOTE — Telephone Encounter (Signed)
Pt would like to know if Dr.Berry received the EKG that was sent over from [facility] on [facility]. Please call  Thanks

## 2014-05-02 NOTE — Telephone Encounter (Signed)
Yes we did receive the EKG

## 2014-05-02 NOTE — Telephone Encounter (Signed)
Jasmin Holloway is calling because she is wanting to know if the  EKG was receive from the [facility] . She has called once already and has not heard from anyone. Please Call    Thanks

## 2014-05-02 NOTE — Telephone Encounter (Signed)
I will have Dr Gwenlyn Found review the EKG when he is in the office next.  The EKG has been preliminarily reviewed.

## 2014-05-08 NOTE — Telephone Encounter (Signed)
Dr Gwenlyn Found reviewed the EKG yesterday afternoon- EKG is stable with no changes.   lmom for patient.

## 2014-05-16 DIAGNOSIS — L821 Other seborrheic keratosis: Secondary | ICD-10-CM | POA: Diagnosis not present

## 2014-05-16 DIAGNOSIS — L905 Scar conditions and fibrosis of skin: Secondary | ICD-10-CM | POA: Diagnosis not present

## 2014-05-16 DIAGNOSIS — L940 Localized scleroderma [morphea]: Secondary | ICD-10-CM | POA: Diagnosis not present

## 2014-05-16 DIAGNOSIS — Z85828 Personal history of other malignant neoplasm of skin: Secondary | ICD-10-CM | POA: Diagnosis not present

## 2014-05-21 ENCOUNTER — Encounter: Payer: Self-pay | Admitting: *Deleted

## 2014-05-23 ENCOUNTER — Ambulatory Visit (INDEPENDENT_AMBULATORY_CARE_PROVIDER_SITE_OTHER): Payer: Medicare Other | Admitting: Cardiology

## 2014-05-23 ENCOUNTER — Encounter: Payer: Self-pay | Admitting: Cardiology

## 2014-05-23 VITALS — BP 124/74 | HR 64 | Ht 64.0 in | Wt 146.5 lb

## 2014-05-23 DIAGNOSIS — I208 Other forms of angina pectoris: Secondary | ICD-10-CM | POA: Diagnosis not present

## 2014-05-23 DIAGNOSIS — I10 Essential (primary) hypertension: Secondary | ICD-10-CM | POA: Diagnosis not present

## 2014-05-23 DIAGNOSIS — I2089 Other forms of angina pectoris: Secondary | ICD-10-CM

## 2014-05-23 DIAGNOSIS — I25119 Atherosclerotic heart disease of native coronary artery with unspecified angina pectoris: Secondary | ICD-10-CM

## 2014-05-23 DIAGNOSIS — R0789 Other chest pain: Secondary | ICD-10-CM | POA: Diagnosis not present

## 2014-05-23 DIAGNOSIS — I2510 Atherosclerotic heart disease of native coronary artery without angina pectoris: Secondary | ICD-10-CM

## 2014-05-23 DIAGNOSIS — E785 Hyperlipidemia, unspecified: Secondary | ICD-10-CM

## 2014-05-23 DIAGNOSIS — I209 Angina pectoris, unspecified: Secondary | ICD-10-CM

## 2014-05-23 NOTE — Patient Instructions (Signed)
Schedule Stress Myoview Follow instructions given.

## 2014-05-23 NOTE — Assessment & Plan Note (Signed)
She had an abnormal stress test for arm pain in 2000 resulting in cardiac cath and LAD/diagonal branch intervention by Dr. Rollene Fare. Her last Myoview in 2002 and was nonischemic.  Now with similar type of angina. See above note

## 2014-05-23 NOTE — Assessment & Plan Note (Signed)
Patient with left arm and neck pain resolved with Ultram, but concerning as with her previous need for stent with her coronary disease her symptoms were similar.  We'll proceed with an exercise Myoview to further evaluate her coronary disease and angina.

## 2014-05-23 NOTE — Assessment & Plan Note (Signed)
Followed by Dr. Forde Dandy, will call his office and get most recent lipid panel.

## 2014-05-23 NOTE — Progress Notes (Signed)
05/23/2014   PCP: Sheela Stack, MD   Chief Complaint  Patient presents with  . Follow-up    pt denies chest pain, sob and swelling.    Primary Cardiologist:Dr. Adora Fridge   HPI:  80 year old thin appearing married Caucasian female mother of 2 children, grandmother to 2 grandchildren whose husband Iona Beard is also a patient of Dr. Adora Fridge.  Previously the patient saw Dr. Terance Ice. She has a history of CAD status post LAD/diagonal branch intervention by Dr. Rollene Fare back in March of 2000. She has not had an procedure since. Her last Myoview performed in 2010 and was nonischemic. She works out at [employer] 3 days a week and does aerobic exercise at [employer] 2 days a week. Her other problems include history of treated hypertension and hyperlipidemia. Last Echo 2014 EF 15-17% grade 1 diastolic dysfunction.  Mild MR.   Today she does relate an episode of Lt neck, Lt arm, pain without associated symptoms about 2 weeks ago.  She saw PCP, the pain resolved with ultram and muscle relaxer.  Though she only took 2 ultram and the muscle relaxer caused palpitations so she only took one or two.  No further pain.  But with her previous abnormal stress test resulting in stents she had lt arm pain.    No Known Allergies  Current Outpatient Prescriptions  Medication Sig Dispense Refill  . aspirin 81 MG tablet Take 81 mg by mouth daily.      Marland Kitchen atorvastatin (LIPITOR) 40 MG tablet Take 1 tablet (40 mg total) by mouth daily.  90 tablet  3  . benazepril-hydrochlorthiazide (LOTENSIN HCT) 10-12.5 MG per tablet Take 1 tablet by mouth  daily  90 tablet  2  . Biotin 5000 MCG CAPS Take 1 capsule by mouth daily.      . cholecalciferol (VITAMIN D) 1000 UNITS tablet Take 1,000 Units by mouth daily.      Marland Kitchen EVENING PRIMROSE OIL PO Take 1 tablet by mouth daily.      . Melatonin 3 MG CAPS Take 3 mg by mouth at bedtime as needed.      . metoprolol tartrate (LOPRESSOR) 25 MG tablet  Take 1 tablet by mouth two  times daily  180 tablet  2  . Misc Natural Products (OSTEO BI-FLEX JOINT SHIELD) TABS Take 2 tablets by mouth daily.      . Multiple Minerals-Vitamins (CALCIUM CITRATE PLUS PO) Take 1 tablet by mouth 2 (two) times daily.      . Multiple Vitamin (MULTI VITAMIN DAILY PO) Take 1 tablet by mouth daily.      . Omega-3 Fatty Acids (FISH OIL) 1200 MG CPDR Take 1 tablet by mouth daily.      . pantoprazole (PROTONIX) 40 MG tablet Take 1 tablet (40 mg total) by mouth daily.  30 tablet  1  . vitamin C (ASCORBIC ACID) 500 MG tablet Take 500 mg by mouth daily.       No current facility-administered medications for this visit.    Past Medical History  Diagnosis Date  . Coronary artery disease   . Hypertension   . Hyperlipidemia   . Mitral regurgitation     mild 03/29/2013 echo  . Aortic sclerosis     03/29/13 echo    Past Surgical History  Procedure Laterality Date  . Coronary angioplasty with stent placement  11/16/1998    stent to the LAD and PTCA side branch diagonal  .  Nm myocar perf wall motion  07/13/2009    IOX:BDZHGDJ:ME colds or fevers, no significant weight changes Skin:no rashes or ulcers HEENT:no blurred vision, no congestion CV:see HPI PUL:see HPI GI:no diarrhea constipation or melena, no indigestion GU:no hematuria, no dysuria MS:no joint pain, no claudication Neuro:no syncope, no lightheadedness Endo:no diabetes, no thyroid disease  Wt Readings from Last 3 Encounters:  05/23/14 146 lb 8 oz (66.452 kg)  11/08/13 142 lb (64.411 kg)    PHYSICAL EXAM BP 124/74  Pulse 64  Ht 5\' 4"  (1.626 m)  Wt 146 lb 8 oz (66.452 kg)  BMI 25.13 kg/m2 General:Pleasant affect, NAD Skin:Warm and dry, brisk capillary refill HEENT:normocephalic, sclera clear, mucus membranes moist Neck:supple, no JVD, no bruits  Heart:S1S2 RRR without murmur, gallup, rub or click Lungs:clear without rales, rhonchi, or wheezes QAS:TMHD, non tender, + BS, do not palpate liver  spleen or masses Ext:no lower ext edema, 2+ pedal pulses, 2+ radial pulses Neuro:alert and oriented, MAE, follows commands, + facial symmetry  EKG:SR rate 65 no changes from 11/08/13.  ASSESSMENT AND PLAN Angina at rest, chest pain, arm pain Patient with left arm and neck pain resolved with Ultram, but concerning as with her previous need for stent with her coronary disease her symptoms were similar.  We'll proceed with an exercise Myoview to further evaluate her coronary disease and angina.     Coronary artery disease She had an abnormal stress test for arm pain in 2000 resulting in cardiac cath and LAD/diagonal branch intervention by Dr. Rollene Fare. Her last Myoview in 2002 and was nonischemic.  Now with similar type of angina. See above note  Essential hypertension Initially blood pressure appeared low at 97 systolic with automatic cuff but on recheck was wall mount blood pressure 124/74.  Hyperlipidemia Followed by Dr. Forde Dandy, will call his office and get most recent lipid panel.    Followup with Dr. Gwenlyn Found in 6 months- we will call the results of the stress test unless positive and she will come in to be seen.

## 2014-05-23 NOTE — Assessment & Plan Note (Signed)
Initially blood pressure appeared low at 97 systolic with automatic cuff but on recheck was wall mount blood pressure 124/74.

## 2014-05-28 DIAGNOSIS — Z23 Encounter for immunization: Secondary | ICD-10-CM | POA: Diagnosis not present

## 2014-06-04 ENCOUNTER — Ambulatory Visit (HOSPITAL_COMMUNITY)
Admission: RE | Admit: 2014-06-04 | Discharge: 2014-06-04 | Disposition: A | Discharge status: Home / Self Care | Payer: Medicare Other | Source: Ambulatory Visit | Attending: Cardiovascular Disease | Admitting: Cardiovascular Disease

## 2014-06-04 DIAGNOSIS — R0789 Other chest pain: Secondary | ICD-10-CM | POA: Diagnosis not present

## 2014-06-04 DIAGNOSIS — I10 Essential (primary) hypertension: Secondary | ICD-10-CM | POA: Diagnosis not present

## 2014-06-04 DIAGNOSIS — E785 Hyperlipidemia, unspecified: Secondary | ICD-10-CM | POA: Diagnosis not present

## 2014-06-04 DIAGNOSIS — I2510 Atherosclerotic heart disease of native coronary artery without angina pectoris: Secondary | ICD-10-CM | POA: Diagnosis not present

## 2014-06-04 DIAGNOSIS — Z8249 Family history of ischemic heart disease and other diseases of the circulatory system: Secondary | ICD-10-CM | POA: Diagnosis not present

## 2014-06-04 MED ORDER — TECHNETIUM TC 99M SESTAMIBI GENERIC - CARDIOLITE
30.0000 | Freq: Once | INTRAVENOUS | Status: AC | PRN
  Administered 2014-06-04: 30 via INTRAVENOUS

## 2014-06-04 MED ORDER — TECHNETIUM TC 99M SESTAMIBI GENERIC - CARDIOLITE
10.0000 | Freq: Once | INTRAVENOUS | Status: AC | PRN
  Administered 2014-06-04: 10 via INTRAVENOUS

## 2014-06-04 NOTE — Procedures (Addendum)
Jasmin Hollow[facility] [address] [place] [address] [phone]  Cardiology Nuclear Med Study  Jasmin Holloway is a 80 y.o. female     MRN : [id number]     DOB: [birth year]  Procedure Date: 06/04/2014  Nuclear Med Background Indication for Stress Test:  Evaluation for Ischemia, Stent Patency and PTCA Patency History:  CAD;MI-2000;STENT/PTCA-11/16/1998;No prior respiratory history reported;Last NUC MPI on 07/13/2009-nonischemic;EF=70%;ECHO on 03/29/2013-LVEF=55-60% Cardiac Risk Factors: Family History - CAD, Hypertension and Lipids  Symptoms:  Chest Pain, DOE, Fatigue and Left sided neck pain and left arm pain reported.   Nuclear Pre-Procedure Caffeine/Decaff Intake:  7:00pm NPO After: 5:00am   IV Site: R Forearm  IV 0.9% NS with Angio Cath:  22g  Chest Size (in):  n/a IV Started by: Rolene Course, RN  Height: 5\' 4"  (1.626 m)  Cup Size: DD  BMI:  Body mass index is 25.05 kg/(m^2). Weight:  146 lb (66.225 kg)   Tech Comments:  n/a    Nuclear Med Study 1 or 2 day study: 1 day  Stress Test Type:  Stress  Order Authorizing Provider:  Quay Burow, MD   Resting Radionuclide: Technetium 79m Sestamibi  Resting Radionuclide Dose: 10.3 mCi   Stress Radionuclide:  Technetium 68m Sestamibi  Stress Radionuclide Dose: 31.2 mCi           Stress Protocol Rest HR: 75 Stress HR: 148  Rest BP: 138/86 Stress BP: 194/92  Exercise Time (min): 6 METS: 7.0   Predicted Max HR: 143 bpm % Max HR: 103.5 bpm Rate Pressure Product: 28712  Dose of Adenosine (mg):  n/a Dose of Lexiscan: n/a mg  Dose of Atropine (mg): n/a Dose of Dobutamine: n/a mcg/kg/min (at max HR)  Stress Test Technologist: Leane Para, CCT Nuclear Technologist: Otho Perl, CNMT   Rest Procedure:  Myocardial perfusion imaging was performed at rest 45 minutes following the intravenous administration of Technetium 92m Sestamibi. Stress Procedure:  The patient  performed treadmill exercise using a Bruce  Protocol for 6 minutes. The patient stopped due to SOB and denied any chest pain.  There were no significant ST-T wave changes.  Technetium 69m Sestamibi was injected IV at peak exercise and myocardial perfusion imaging was performed after a brief delay.  Transient Ischemic Dilatation (Normal <1.22):  1.11  QGS EDV:  49 ml QGS ESV:  19 ml LV Ejection Fraction: 61%        Rest ECG: NSR - Normal EKG  Stress ECG: No significant change from baseline ECG  QPS Raw Data Images:  Normal; no motion artifact; normal heart/lung ratio. Stress Images:  Normal homogeneous uptake in all areas of the myocardium. Rest Images:  Normal homogeneous uptake in all areas of the myocardium. Subtraction (SDS):  No evidence of ischemia.  Impression Exercise Capacity:  Good exercise capacity. BP Response:  Normal blood pressure response. Clinical Symptoms:  No significant symptoms noted. ECG Impression:  No significant ST segment change suggestive of ischemia. Comparison with Prior Nuclear Study: No significant change from previous study  Overall Impression:  Normal stress nuclear study.  LV Wall Motion:  NL LV Function; NL Wall Motion   Lorretta Harp, MD  06/04/2014 12:28 PM

## 2014-06-06 ENCOUNTER — Other Ambulatory Visit: Payer: Self-pay | Admitting: Endocrinology

## 2014-06-06 ENCOUNTER — Encounter: Payer: Self-pay | Admitting: *Deleted

## 2014-06-06 DIAGNOSIS — N6320 Unspecified lump in the left breast, unspecified quadrant: Secondary | ICD-10-CM

## 2014-06-17 ENCOUNTER — Other Ambulatory Visit: Payer: Self-pay | Admitting: Endocrinology

## 2014-06-17 ENCOUNTER — Ambulatory Visit
Admission: RE | Admit: 2014-06-17 | Discharge: 2014-06-17 | Disposition: A | Discharge status: Home / Self Care | Payer: Medicare Other | Source: Ambulatory Visit | Attending: Endocrinology | Admitting: Endocrinology

## 2014-06-17 DIAGNOSIS — N6320 Unspecified lump in the left breast, unspecified quadrant: Secondary | ICD-10-CM

## 2014-06-17 DIAGNOSIS — N6489 Other specified disorders of breast: Secondary | ICD-10-CM | POA: Diagnosis not present

## 2014-06-23 ENCOUNTER — Other Ambulatory Visit: Payer: Self-pay | Admitting: Internal Medicine

## 2014-06-23 DIAGNOSIS — N63 Unspecified lump in unspecified breast: Secondary | ICD-10-CM

## 2014-06-24 ENCOUNTER — Other Ambulatory Visit: Payer: Self-pay | Admitting: Endocrinology

## 2014-06-24 DIAGNOSIS — N63 Unspecified lump in unspecified breast: Secondary | ICD-10-CM

## 2014-06-27 DIAGNOSIS — H524 Presbyopia: Secondary | ICD-10-CM | POA: Diagnosis not present

## 2014-06-27 DIAGNOSIS — H52203 Unspecified astigmatism, bilateral: Secondary | ICD-10-CM | POA: Diagnosis not present

## 2014-06-27 DIAGNOSIS — Z961 Presence of intraocular lens: Secondary | ICD-10-CM | POA: Diagnosis not present

## 2014-06-30 DIAGNOSIS — Z9861 Coronary angioplasty status: Secondary | ICD-10-CM | POA: Diagnosis not present

## 2014-06-30 DIAGNOSIS — R7309 Other abnormal glucose: Secondary | ICD-10-CM | POA: Diagnosis not present

## 2014-06-30 DIAGNOSIS — I10 Essential (primary) hypertension: Secondary | ICD-10-CM | POA: Diagnosis not present

## 2014-06-30 DIAGNOSIS — Z6825 Body mass index (BMI) 25.0-25.9, adult: Secondary | ICD-10-CM | POA: Diagnosis not present

## 2014-06-30 DIAGNOSIS — N63 Unspecified lump in breast: Secondary | ICD-10-CM | POA: Diagnosis not present

## 2014-06-30 DIAGNOSIS — M8580 Other specified disorders of bone density and structure, unspecified site: Secondary | ICD-10-CM | POA: Diagnosis not present

## 2014-06-30 DIAGNOSIS — I2510 Atherosclerotic heart disease of native coronary artery without angina pectoris: Secondary | ICD-10-CM | POA: Diagnosis not present

## 2014-06-30 DIAGNOSIS — E785 Hyperlipidemia, unspecified: Secondary | ICD-10-CM | POA: Diagnosis not present

## 2014-07-01 ENCOUNTER — Other Ambulatory Visit: Payer: Medicare Other

## 2014-07-01 ENCOUNTER — Ambulatory Visit
Admission: RE | Admit: 2014-07-01 | Discharge: 2014-07-01 | Disposition: A | Discharge status: Home / Self Care | Payer: Medicare Other | Source: Ambulatory Visit | Attending: Endocrinology | Admitting: Endocrinology

## 2014-07-01 DIAGNOSIS — N63 Unspecified lump in unspecified breast: Secondary | ICD-10-CM

## 2014-07-01 DIAGNOSIS — N6489 Other specified disorders of breast: Secondary | ICD-10-CM | POA: Diagnosis not present

## 2014-07-01 MED ORDER — GADOBENATE DIMEGLUMINE 529 MG/ML IV SOLN
13.0000 mL | Freq: Once | INTRAVENOUS | Status: AC | PRN
  Administered 2014-07-01: 13 mL via INTRAVENOUS

## 2014-07-02 ENCOUNTER — Other Ambulatory Visit: Payer: Self-pay | Admitting: Endocrinology

## 2014-07-02 DIAGNOSIS — R928 Other abnormal and inconclusive findings on diagnostic imaging of breast: Secondary | ICD-10-CM

## 2014-07-07 ENCOUNTER — Other Ambulatory Visit: Payer: Self-pay | Admitting: Endocrinology

## 2014-07-07 ENCOUNTER — Ambulatory Visit
Admission: RE | Admit: 2014-07-07 | Discharge: 2014-07-07 | Disposition: A | Discharge status: Home / Self Care | Payer: Medicare Other | Source: Ambulatory Visit | Attending: Endocrinology | Admitting: Endocrinology

## 2014-07-07 ENCOUNTER — Other Ambulatory Visit: Payer: Self-pay | Admitting: Diagnostic Radiology

## 2014-07-07 DIAGNOSIS — R928 Other abnormal and inconclusive findings on diagnostic imaging of breast: Secondary | ICD-10-CM

## 2014-07-07 DIAGNOSIS — C50212 Malignant neoplasm of upper-inner quadrant of left female breast: Secondary | ICD-10-CM | POA: Diagnosis not present

## 2014-07-07 DIAGNOSIS — D0512 Intraductal carcinoma in situ of left breast: Secondary | ICD-10-CM | POA: Diagnosis not present

## 2014-07-07 DIAGNOSIS — N6489 Other specified disorders of breast: Secondary | ICD-10-CM | POA: Diagnosis not present

## 2014-07-07 MED ORDER — GADOBENATE DIMEGLUMINE 529 MG/ML IV SOLN
13.0000 mL | Freq: Once | INTRAVENOUS | Status: AC | PRN
  Administered 2014-07-07: 13 mL via INTRAVENOUS

## 2014-07-08 ENCOUNTER — Ambulatory Visit
Admission: RE | Admit: 2014-07-08 | Discharge: 2014-07-08 | Disposition: A | Discharge status: Home / Self Care | Payer: Medicare Other | Source: Ambulatory Visit | Attending: Endocrinology | Admitting: Endocrinology

## 2014-07-08 DIAGNOSIS — R928 Other abnormal and inconclusive findings on diagnostic imaging of breast: Secondary | ICD-10-CM

## 2014-07-08 DIAGNOSIS — C50212 Malignant neoplasm of upper-inner quadrant of left female breast: Secondary | ICD-10-CM | POA: Diagnosis not present

## 2014-07-09 ENCOUNTER — Telehealth: Payer: Self-pay | Admitting: *Deleted

## 2014-07-09 NOTE — Telephone Encounter (Signed)
Left message with her husband for a return phone call to schedule for [facility] 07/16/14.

## 2014-07-11 ENCOUNTER — Encounter: Payer: Self-pay | Admitting: *Deleted

## 2014-07-11 NOTE — [facility] Oncology Navigator Note (Signed)
Confirmed [place] [facility] appointment at 12:00 PM 07/16/14.  Instructions and contact information given.

## 2014-07-14 ENCOUNTER — Other Ambulatory Visit: Payer: Self-pay | Admitting: *Deleted

## 2014-07-14 DIAGNOSIS — C50212 Malignant neoplasm of upper-inner quadrant of left female breast: Secondary | ICD-10-CM

## 2014-07-16 ENCOUNTER — Ambulatory Visit (HOSPITAL_BASED_OUTPATIENT_CLINIC_OR_DEPARTMENT_OTHER): Payer: Medicare Other | Admitting: Hematology and Oncology

## 2014-07-16 ENCOUNTER — Encounter: Payer: Self-pay | Admitting: Hematology and Oncology

## 2014-07-16 ENCOUNTER — Ambulatory Visit: Payer: Medicare Other

## 2014-07-16 ENCOUNTER — Ambulatory Visit
Admission: RE | Admit: 2014-07-16 | Discharge: 2014-07-16 | Disposition: A | Discharge status: Home / Self Care | Payer: Medicare Other | Source: Ambulatory Visit | Attending: Radiation Oncology | Admitting: Radiation Oncology

## 2014-07-16 ENCOUNTER — Ambulatory Visit: Payer: Medicare Other | Admitting: Physical Therapy

## 2014-07-16 ENCOUNTER — Other Ambulatory Visit (INDEPENDENT_AMBULATORY_CARE_PROVIDER_SITE_OTHER): Payer: Self-pay | Admitting: General Surgery

## 2014-07-16 ENCOUNTER — Other Ambulatory Visit (HOSPITAL_BASED_OUTPATIENT_CLINIC_OR_DEPARTMENT_OTHER): Payer: Medicare Other

## 2014-07-16 VITALS — BP 135/74 | HR 70 | Temp 97.6°F | Resp 18 | Ht 63.75 in | Wt 146.7 lb

## 2014-07-16 DIAGNOSIS — C50912 Malignant neoplasm of unspecified site of left female breast: Secondary | ICD-10-CM | POA: Diagnosis not present

## 2014-07-16 DIAGNOSIS — C50212 Malignant neoplasm of upper-inner quadrant of left female breast: Secondary | ICD-10-CM

## 2014-07-16 DIAGNOSIS — Z170 Estrogen receptor positive status [ER+]: Secondary | ICD-10-CM

## 2014-07-16 LAB — COMPREHENSIVE METABOLIC PANEL (CC13)
ALK PHOS: 53 U/L (ref 40–150)
ALT: 30 U/L (ref 0–55)
AST: 30 U/L (ref 5–34)
Albumin: 4.3 g/dL (ref 3.5–5.0)
Anion Gap: 14 mEq/L — ABNORMAL HIGH (ref 3–11)
BILIRUBIN TOTAL: 0.69 mg/dL (ref 0.20–1.20)
BUN: 16.2 mg/dL (ref 7.0–26.0)
CO2: 27 mEq/L (ref 22–29)
Calcium: 10.5 mg/dL — ABNORMAL HIGH (ref 8.4–10.4)
Chloride: 102 mEq/L (ref 98–109)
Creatinine: 0.9 mg/dL (ref 0.6–1.1)
GLUCOSE: 154 mg/dL — AB (ref 70–140)
Potassium: 3.9 mEq/L (ref 3.5–5.1)
Sodium: 143 mEq/L (ref 136–145)
Total Protein: 7.6 g/dL (ref 6.4–8.3)

## 2014-07-16 LAB — CBC WITH DIFFERENTIAL/PLATELET
BASO%: 0.1 % (ref 0.0–2.0)
Basophils Absolute: 0 10*3/uL (ref 0.0–0.1)
EOS ABS: 0.1 10*3/uL (ref 0.0–0.5)
EOS%: 1.1 % (ref 0.0–7.0)
HCT: 41.8 % (ref 34.8–46.6)
HGB: 13.7 g/dL (ref 11.6–15.9)
LYMPH%: 32.5 % (ref 14.0–49.7)
MCH: 30.7 pg (ref 25.1–34.0)
MCHC: 32.8 g/dL (ref 31.5–36.0)
MCV: 93.7 fL (ref 79.5–101.0)
MONO#: 0.6 10*3/uL (ref 0.1–0.9)
MONO%: 6.6 % (ref 0.0–14.0)
NEUT%: 59.7 % (ref 38.4–76.8)
NEUTROS ABS: 5.5 10*3/uL (ref 1.5–6.5)
PLATELETS: 183 10*3/uL (ref 145–400)
RBC: 4.46 10*6/uL (ref 3.70–5.45)
RDW: 13.7 % (ref 11.2–14.5)
WBC: 9.2 10*3/uL (ref 3.9–10.3)
lymph#: 3 10*3/uL (ref 0.9–3.3)

## 2014-07-16 NOTE — Progress Notes (Signed)
Radiation Oncology         [phone] ________________________________  Initial outpatient Consultation  Name: Jasmin Holloway MRN: [id number]  Date: 07/16/2014  DOB: 1934-01-22  WU:[id number] Antony Haste, MD  Excell Seltzer, MD   REFERRING PHYSICIAN: Excell Seltzer, MD  DIAGNOSIS:    ICD-9-CM ICD-10-CM   1. Breast cancer of upper-inner quadrant of left female breast 174.2 C50.212      Stage I  [id number] left Breast UIQ Invasive Ductal Carcinoma, ER+ / PR+ / Her2neg, Ki67 10%  HISTORY OF PRESENT ILLNESS::Jasmin Holloway is a 80 y.o. female who presented with left breast distortion on mammography. 6 mo f/u mammogram showed persistence of this (1cm).  MRI showed a 0.8cm lobulated enhancing mass. MRI guided biopsy revealed a small focus of IDC, ER90% / PR90% / Her2neg, Ki67 10%  She is otherwise in her USOH. She is a retired Optometrist. Her mother lived until age 64; her father into his 73s.  PREVIOUS RADIATION THERAPY: No  PAST MEDICAL HISTORY:  has a past medical history of Coronary artery disease; Hypertension; Hyperlipidemia; Mitral regurgitation; Aortic sclerosis; and Breast cancer.    PAST SURGICAL HISTORY: Past Surgical History  Procedure Laterality Date  . Coronary angioplasty with stent placement  11/16/1998    stent to the LAD and PTCA side branch diagonal  . Nm myocar perf wall motion  07/13/2009  . Appendectomy      FAMILY HISTORY: family history includes Cancer in her mother; Heart attack in her father.  SOCIAL HISTORY:  reports that she has never smoked. She has never used smokeless tobacco. She reports that she drinks about 1.5 oz of alcohol per week. She reports that she does not use illicit drugs.  ALLERGIES: Codeine  MEDICATIONS:  Current Outpatient Prescriptions  Medication Sig Dispense Refill  . aspirin 325 MG tablet Take 325 mg by mouth as needed.    Marland Kitchen aspirin 81 MG tablet Take 81 mg by mouth daily.    Marland Kitchen atorvastatin (LIPITOR) 40 MG tablet  Take 1 tablet (40 mg total) by mouth daily. 90 tablet 3  . benazepril-hydrochlorthiazide (LOTENSIN HCT) 10-12.5 MG per tablet Take 1 tablet by mouth  daily 90 tablet 2  . Biotin 5000 MCG CAPS Take 1 capsule by mouth daily.    . cholecalciferol (VITAMIN D) 1000 UNITS tablet Take 1,000 Units by mouth daily.    . diphenhydramine-acetaminophen (TYLENOL PM) 25-500 MG TABS Take 1 tablet by mouth at bedtime as needed.    Marland Kitchen EVENING PRIMROSE OIL PO Take 1 tablet by mouth daily.    . Melatonin 3 MG CAPS Take 3 mg by mouth at bedtime as needed.    . metoprolol tartrate (LOPRESSOR) 25 MG tablet Take 1 tablet by mouth two  times daily 180 tablet 2  . Misc Natural Products (OSTEO BI-FLEX JOINT SHIELD) TABS Take 2 tablets by mouth daily.    . Multiple Minerals-Vitamins (CALCIUM CITRATE PLUS PO) Take 1 tablet by mouth 2 (two) times daily.    . Multiple Vitamin (MULTI VITAMIN DAILY PO) Take 1 tablet by mouth daily.    . Omega-3 Fatty Acids (FISH OIL) 1200 MG CPDR Take 1 tablet by mouth daily.    . pantoprazole (PROTONIX) 40 MG tablet Take 1 tablet (40 mg total) by mouth daily. 30 tablet 1  . vitamin C (ASCORBIC ACID) 500 MG tablet Take 500 mg by mouth daily.     No current facility-administered medications for this encounter.    REVIEW OF SYSTEMS:  Notable  for that above.   PHYSICAL EXAM:    Vitals - 1 value per visit 78/93/8101  SYSTOLIC 751  DIASTOLIC 74  Pulse 70  Temperature 97.6  Respirations 18  Weight (lb) 146.7  Height 5' 3.75"  BMI 25.39  VISIT REPORT    General: Alert and oriented, in no acute distress HEENT: Head is normocephalic. Pupils are equally round and reactive to light. Extraocular movements are intact. Oropharynx is clear. Neck: Neck is supple, no palpable cervical or supraclavicular lymphadenopathy. Heart: Regular in rate and rhythm with no murmurs, rubs, or gallops. Chest: Clear to auscultation bilaterally, with no rhonchi, wheezes, or rales. Abdomen: Soft, nontender,  nondistended, with no rigidity or guarding. Extremities: No cyanosis or edema. Lymphatics: see HEENT Skin: No concerning lesions. Musculoskeletal: symmetric strength and muscle tone throughout. Neurologic: Cranial nerves II through XII are grossly intact. No obvious focalities. Speech is fluent. Coordination is intact. Psychiatric: Judgment and insight are intact. Affect is appropriate. BREASTS: No nodes palpated in right breast or axillary regions bilaterally. Left breast - 1.5 cm area of palpable firmness in UIQ.  ECOG = 0  0 - Asymptomatic (Fully active, able to carry on all predisease activities without restriction)  1 - Symptomatic but completely ambulatory (Restricted in physically strenuous activity but ambulatory and able to carry out work of a light or sedentary nature. For example, light housework, office work)  2 - Symptomatic, <50% in bed during the day (Ambulatory and capable of all self care but unable to carry out any work activities. Up and about more than 50% of waking hours)  3 - Symptomatic, >50% in bed, but not bedbound (Capable of only limited self-care, confined to bed or chair 50% or more of waking hours)  4 - Bedbound (Completely disabled. Cannot carry on any self-care. Totally confined to bed or chair)  5 - Death   Eustace Pen MM, Creech RH, Tormey DC, et al. [phone]). "Toxicity and response criteria of the [facility] Group". [place] Oncol. 5 (6): 649-55   LABORATORY DATA:  Lab Results  Component Value Date   WBC 9.2 07/16/2014   HGB 13.7 07/16/2014   HCT 41.8 07/16/2014   MCV 93.7 07/16/2014   PLT 183 07/16/2014   CMP     Component Value Date/Time   NA 143 07/16/2014 1242   K 3.9 07/16/2014 1242   CO2 27 07/16/2014 1242   GLUCOSE 154* 07/16/2014 1242   BUN 16.2 07/16/2014 1242   CREATININE 0.9 07/16/2014 1242   CALCIUM 10.5* 07/16/2014 1242   PROT 7.6 07/16/2014 1242   ALBUMIN 4.3 07/16/2014 1242   AST 30 07/16/2014 1242   ALT 30  07/16/2014 1242   ALKPHOS 53 07/16/2014 1242   BILITOT 0.69 07/16/2014 1242         RADIOGRAPHY: Mr Breast Bilateral W Wo Contrast  07/01/2014   CLINICAL DATA:  Left medial breast distortion on one view only. MRI recommended for problem solving purposes.  LABS:  Not obtained  EXAM: BILATERAL BREAST MRI WITH AND WITHOUT CONTRAST  TECHNIQUE: Multiplanar, multisequence MR images of both breasts were obtained prior to and following the intravenous administration of 15m of MultiHance.  THREE-DIMENSIONAL MR IMAGE RENDERING ON INDEPENDENT WORKSTATION:  Three-dimensional MR images were rendered by post-processing of the original MR data on an independent workstation. The three-dimensional MR images were interpreted, and findings are reported in the following complete MRI report for this study. Three dimensional images were evaluated at the independent DynaCad workstation  COMPARISON:  Prior exams including most recent screening mammogram March 2015. Report of analog CT 01/23/2003 demonstrated hepatic cysts.  FINDINGS: Breast composition: c:  Heterogeneous fibroglandular tissue  Background parenchymal enhancement: Minimal  Right breast: No mass or abnormal enhancement.  Left breast: In the left breast 10 o'clock location anterior depth there is a 0.8 x 0.7 x 0.6 cm lobulated enhancing mass with plateau type enhancement kinetics.  Lymph nodes: No abnormal appearing lymph nodes.  Ancillary findings: T2 hyperintense unenhancing presumed hepatic cysts are reidentified, as per the prior report.  IMPRESSION: Suspicious left breast mass 10 o'clock location. Because the area was previously evaluated at dedicated diagnostic evaluation, and no sonographic correlate was seen, MRI guided core biopsy is recommended for further evaluation.  No MRI evidence for malignancy in the right breast.  RECOMMENDATION: Left MRI guided core biopsy  BI-RADS CATEGORY  4: Suspicious.   Electronically Signed   By: Conchita Paris M.D.   On:  07/01/2014 12:11   Mm Radiologist Eval And Mgmt  07/08/2014   EXAM: ESTABLISHED PATIENT OFFICE VISIT - LEVEL II  CHIEF COMPLAINT: The patient and her husband present for biopsy results.  HISTORY OF PRESENT ILLNESS: Status post MR guided core biopsy of the left breast.  EXAM: Left breast biopsy site is clean and dry.  I palpate no hematoma.  PATHOLOGY: Breast, left, needle core biopsy, upper inner quadrant- INVASIVE DUCTAL CARCINOMA.- DUCTAL CARCINOMA IN SITU WITH CALCIFICATIONS.  ASSESSMENT AND PLAN: ASSESSMENT AND PLAN Consultation is arranged for the patient at the [facility] on 07/16/2014. Educational materials were provided to the patient. Questions were answered.   Electronically Signed   By: Shon Hale M.D.   On: 07/08/2014 14:43   Mm Diag Breast Tomo Uni Left  07/07/2014   CLINICAL DATA:  Status post MR guided core biopsy of small area of enhancement in the upper inner quadrant of the left breast.  EXAM: DIGITAL DIAGNOSTIC UNILATERAL LEFT MAMMOGRAM WITH 3D TOMOSYNTHESIS  COMPARISON:  Prior study  ACR Breast Density Category c: The breast tissue is heterogeneously dense, which may obscure small masses.  FINDINGS: Tomographic images are performed following MR guided core biopsy. Clip within the upper inner quadrant of the left breast is identified, so she was small amount of post biopsy change. The clip is in the same area of distortion previously noted.  IMPRESSION: Clip confirms biopsy of MRI detected enhancement and mammographically detected distortion.  RECOMMENDATION: Pathology followup  I have discussed the findings and recommendations with the patient. Results were also provided in writing at the conclusion of the visit. If applicable, a reminder letter will be sent to the patient regarding the next appointment.  BI-RADS CATEGORY  Final Assessment: Post Procedure Mammograms for Marker Placement   Electronically Signed   By: Shon Hale M.D.   On: 07/07/2014 09:47    Mm Diag Breast Tomo Uni Left  06/17/2014   CLINICAL DATA:  Followup possible left breast distortion  EXAM: DIGITAL DIAGNOSTIC UNILATERAL LEFT MAMMOGRAM WITH 3D TOMOSYNTHESIS AND CAD  COMPARISON:  [facility], most recent bilateral exam 11/25/2013  ACR Breast Density Category c: The breast tissue is heterogeneously dense, which may obscure small masses.  FINDINGS: There is persistent approximately 1 cm distortion in the left breast 9-10 o'clock location, middle depth. No new abnormality is otherwise identified in the left breast. Scattered benign-appearing calcifications elsewhere in the left breast are stable.  Mammographic images were processed with CAD.  IMPRESSION: Persistent distortion, left breast 9:00 to 10:00  location. No previous sonographic correlate was identified in this area at targeted examination. MRI of both breasts with contrast is recommended to determine whether MRI would be an option for image guided biopsy. This would be the preferred guidance modality for percutaneous core needle biopsy. If no MRI correlate is identified, then stereotactic core needle biopsy would then be recommended.  RECOMMENDATION: Breast MRI, left breast MRI core biopsy if possible  I have discussed the findings and recommendations with the patient. Results were also provided in writing at the conclusion of the visit. If applicable, a reminder letter will be sent to the patient regarding the next appointment.  BI-RADS CATEGORY  4: Suspicious.   Electronically Signed   By: Conchita Paris M.D.   On: 06/17/2014 09:21   Mr Aundra Millet Breast Bx Johnella Moloney Dev 1st Lesion Image Bx Spec Mr Guide  07/07/2014   CLINICAL DATA:  The patient presents for MR guided core biopsy of enhancement in the left breast.  EXAM: MRI GUIDED CORE NEEDLE BIOPSY OF THE LEFT BREAST  TECHNIQUE: Multiplanar, multisequence MR imaging of the left breast was performed both before and after administration of intravenous contrast.  CONTRAST:  56m MULTIHANCE GADOBENATE  DIMEGLUMINE 529 MG/ML IV SOLN  COMPARISON:  Previous exams.  FINDINGS: I met with the patient, and we discussed the procedure of MRI guided biopsy, including risks, benefits, and alternatives. Specifically, we discussed the risks of infection, bleeding, tissue injury, clip migration, and inadequate sampling. Informed, written consent was given. The usual time out protocol was performed immediately prior to the procedure.  Using sterile technique, 2% Lidocaine, MRI guidance, and a 9 gauge vacuum assisted device, biopsy was performed of focal area of enhancement within the upper inner quadrant of the left breast using a lateral approach. At the conclusion of the procedure, a bow tie shaped tissue marker clip was deployed into the biopsy cavity. Follow-up 2-view mammogram was performed and dictated separately.  IMPRESSION: MRI guided biopsy of focal area of left breast enhancement. No apparent complications.   Electronically Signed   By: BShon HaleM.D.   On: 07/07/2014 09:43      IMPRESSION/PLAN:Today, I talked to the patient about the findings and work-up thus far. We discussed the patient's diagnosis of left breast cancer and general treatment for this, highlighting the role of radiotherapy in the management.   She has been discussed at our multidisciplinary tumor board.  The consensus is that she isa good candidate for breast conservation. I talked to her about the option of a mastectomy and informed her that her expected overall survival would be equivalent between mastectomy and breast conservation, based upon randomized controlled data. She is enthusiastic about breast conservation.  For the patient's early stage favorable risk breast cancer, we had a thorough discussion about her options for adjuvant therapy. One option would be antiestrogen therapy as discussed with medical oncology.   Of note, I discussed the data from the [employer] Incal trial in the [place] [facility].  She understands  that the main benefit of  adding radiotherapy to anti estrogen therapy would be a very small but measurable local control benefit (risk of local recurrence to be lowered from ~9% --> ~2% over a decade). Most likely, she will not need radiotherapy, if anti-estrogen therapy is pursued. Her prognosis will most likely be excellent without radiotherapy.  Therefore, I will see her back PRN, i.e. if she decides against anti-estrogen therapy, or if her final pathology reveals additional risk  factors.  __________________________________________   Eppie Gibson, MD

## 2014-07-16 NOTE — Progress Notes (Signed)
Jasmin Holloway is a very pleasant 80 y.o.Marland Kitchen female from [place], [place] with newly diagnosed invasive ductal carcinoma of the left breast as well as foci of DCIS.  Biopsy results also revealed pathology indicating the tumor is ER positive, PR positive, and HER2/neu negative.  She presents today with her husband to the [facility]) for treatment consideration and recommendations from the breast surgeon, radiation oncologist, and medical oncologist.     I briefly met with Jasmin Holloway and her husband  during her [facility] visit today. We discussed the purpose of the Survivorship Clinic, which will include monitoring for recurrence, coordinating completion of age and gender-appropriate cancer screenings, promotion of overall wellness, as well as managing potential late/long-term side effects of anti-cancer treatments.    As of today, the treatment plan for Jasmin Holloway will likely include surgery.  Per Dr. Isidore Moos, radiation therapy is only likely to be offered if she declines endocrine therapy and/or her final surgical pathology reveals additional risk factors.  Endocrine therapy with tamoxifen will also be considered for her as well.The intent of treatment for Jasmin Holloway is cure, therefore she is will be eligible for the Survivorship Clinic upon her completion of treatment.  Her survivorship care plan (SCP) document has been drafted and will be updated throughout the course of her treatment trajectory.  She will receive the SCP in an office visit with myself in the Survivorship Clinic once she has completed treatment.   Jasmin Holloway was encouraged to ask questions and all questions were answered to her satisfaction.  She was given my business card and encouraged to contact me with any concerns regarding survivorship.  I look forward to  participating in her care.

## 2014-07-16 NOTE — Progress Notes (Signed)
[facility] [place] CONSULT NOTE  Patient Care Team: Sheela Stack, MD as PCP - General (Endocrinology) Rulon Eisenmenger, MD as Consulting Physician (Hematology and Oncology) Excell Seltzer, MD as Consulting Physician (General Surgery) Eppie Gibson, MD as Attending Physician (Radiation Oncology) Trinda Pascal, NP as Nurse Practitioner (Nurse Practitioner)  CHIEF COMPLAINTS/PURPOSE OF CONSULTATION:  Newly diagnosed breast cancer  HISTORY OF PRESENTING ILLNESS:  Jasmin Holloway 80 y.o. female is here because of recent diagnosis of left breast cancer. She had left breast distortion and underwent a six-month followup mammogram that revealed a 1 cm area of abnormality. This was biopsy-proven to be invasive ductal carcinoma with micropapillary features that was ER/PR positive HER-2 negative with a Ki-67 of 10%. An MRI of the breasts was performed which revealed a small lobular abnormality measuring 0.8 cm. She was presented this morning in the multidisciplinary tumor board and she is here today at [facility] clinic to discuss the treatment plan.  I reviewed her records extensively and collaborated the history with the patient.  SUMMARY OF ONCOLOGIC HISTORY:   Breast cancer of upper-inner quadrant of left female breast   07/01/2014 Breast MRI Left breast 10:00 position 0.8 x 0.7 x 0.6 cm lobulated enhancing mass, liver cysts   07/07/2014 Initial Diagnosis Left breast invasive ductal carcinoma with DCIS with calcifications, grade 2 with micropapillary features ER 90%, PR 90%, HER-2 negative ratio 1.34, Ki-67 10%    In terms of breast cancer risk profile:  She menarched at early age of 57 and went to menopause at age 23  She had 2 pregnancy, her first child was born at age 50  She has not received birth control pills.  She was never exposed to fertility medications or hormone replacement therapy.  She has family history of Breast/GYN/GI cancer Moderate 75 lung cancer:  Grandmother age 53 colon cancer  MEDICAL HISTORY:  Past Medical History  Diagnosis Date  . Coronary artery disease   . Hypertension   . Hyperlipidemia   . Mitral regurgitation     mild 03/29/2013 echo  . Aortic sclerosis     03/29/13 echo  . Breast cancer     SURGICAL HISTORY: Past Surgical History  Procedure Laterality Date  . Coronary angioplasty with stent placement  11/16/1998    stent to the LAD and PTCA side branch diagonal  . Nm myocar perf wall motion  07/13/2009  . Appendectomy      SOCIAL HISTORY: History   Social History  . Marital Status: Married    Spouse Name: N/A    Number of Children: N/A  . Years of Education: N/A   Occupational History  . Not on file.   Social History Main Topics  . Smoking status: Never Smoker   . Smokeless tobacco: Never Used  . Alcohol Use: 1.5 oz/week    3 Not specified per week     Comment: wine  . Drug Use: No  . Sexual Activity: Not on file   Other Topics Concern  . Not on file   Social History Narrative    FAMILY HISTORY: Family History  Problem Relation Age of Onset  . Cancer Mother   . Heart attack Father     ALLERGIES:  is allergic to codeine.  MEDICATIONS:  Current Outpatient Prescriptions  Medication Sig Dispense Refill  . aspirin 325 MG tablet Take 325 mg by mouth as needed.    Marland Kitchen aspirin 81 MG tablet Take 81 mg by mouth daily.    Marland Kitchen  atorvastatin (LIPITOR) 40 MG tablet Take 1 tablet (40 mg total) by mouth daily. 90 tablet 3  . benazepril-hydrochlorthiazide (LOTENSIN HCT) 10-12.5 MG per tablet Take 1 tablet by mouth  daily 90 tablet 2  . Biotin 5000 MCG CAPS Take 1 capsule by mouth daily.    . cholecalciferol (VITAMIN D) 1000 UNITS tablet Take 1,000 Units by mouth daily.    . diphenhydramine-acetaminophen (TYLENOL PM) 25-500 MG TABS Take 1 tablet by mouth at bedtime as needed.    Marland Kitchen EVENING PRIMROSE OIL PO Take 1 tablet by mouth daily.    . Melatonin 3 MG CAPS Take 3 mg by mouth at bedtime as needed.    .  metoprolol tartrate (LOPRESSOR) 25 MG tablet Take 1 tablet by mouth two  times daily 180 tablet 2  . Misc Natural Products (OSTEO BI-FLEX JOINT SHIELD) TABS Take 2 tablets by mouth daily.    . Multiple Minerals-Vitamins (CALCIUM CITRATE PLUS PO) Take 1 tablet by mouth 2 (two) times daily.    . Multiple Vitamin (MULTI VITAMIN DAILY PO) Take 1 tablet by mouth daily.    . Omega-3 Fatty Acids (FISH OIL) 1200 MG CPDR Take 1 tablet by mouth daily.    . pantoprazole (PROTONIX) 40 MG tablet Take 1 tablet (40 mg total) by mouth daily. 30 tablet 1  . vitamin C (ASCORBIC ACID) 500 MG tablet Take 500 mg by mouth daily.     No current facility-administered medications for this visit.    REVIEW OF SYSTEMS:   Constitutional: Denies fevers, chills or abnormal night sweats Eyes: Denies blurriness of vision, double vision or watery eyes Ears, nose, mouth, throat, and face: Denies mucositis or sore throat Respiratory: Denies cough, dyspnea or wheezes Cardiovascular: Denies palpitation, chest discomfort or lower extremity swelling Gastrointestinal:  Denies nausea, heartburn or change in bowel habits Skin: Denies abnormal skin rashes Lymphatics: Denies new lymphadenopathy or easy bruising Neurological:Denies numbness, tingling or new weaknesses Behavioral/Psych: Mood is stable, no new changes  Breast:  Denies any palpable lumps or discharge All other systems were reviewed with the patient and are negative.  PHYSICAL EXAMINATION: ECOG PERFORMANCE STATUS: 0 - Asymptomatic  Filed Vitals:   07/16/14 1303  BP: 135/74  Pulse: 70  Temp: 97.6 F (36.4 C)  Resp: 18   Filed Weights   07/16/14 1303  Weight: 146 lb 11.2 oz (66.543 kg)    GENERAL:alert, no distress and comfortable SKIN: skin color, texture, turgor are normal, no rashes or significant lesions EYES: normal, conjunctiva are pink and non-injected, sclera clear OROPHARYNX:no exudate, no erythema and lips, buccal mucosa, and tongue normal   NECK: supple, thyroid normal size, non-tender, without nodularity LYMPH:  no palpable lymphadenopathy in the cervical, axillary or inguinal LUNGS: clear to auscultation and percussion with normal breathing effort HEART: regular rate & rhythm and no murmurs and no lower extremity edema ABDOMEN:abdomen soft, non-tender and normal bowel sounds Musculoskeletal:no cyanosis of digits and no clubbing  PSYCH: alert & oriented x 3 with fluent speech NEURO: no focal motor/sensory deficits BREAST:palpable abnormality which had the biopsy. No palpable axillary or supraclavicular lymphadenopathy  LABORATORY DATA:  I have reviewed the data as listed Lab Results  Component Value Date   WBC 9.2 07/16/2014   HGB 13.7 07/16/2014   HCT 41.8 07/16/2014   MCV 93.7 07/16/2014   PLT 183 07/16/2014   Lab Results  Component Value Date   NA 143 07/16/2014   K 3.9 07/16/2014   CO2 27 07/16/2014  RADIOGRAPHIC STUDIES: I have personally reviewed the radiological reports and agreed with the findings in the report. Results are summarized as above  ASSESSMENT AND PLAN:  Breast cancer of upper-inner quadrant of left female breast Left breast invasive ductal carcinoma 0.8 cm T1 B. N0 M0 stage IA clinical stage, ER 90%, PR 90%, HER-2 negative, ratio 1.34, Ki-67 10%  Radiology and pathology counseling:Discussed with the patient, the details of pathology including the type of breast cancer,the clinical staging, the significance of ER, PR and HER-2/neu receptors and the implications for treatment. After reviewing the pathology in detail, we proceeded to discuss the different treatment options between surgery, radiation, chemotherapy, antiestrogen therapies.  Recommendation: Lumpectomy followed by antiestrogen therapy with anastrozole 1 mg daily for 5 years  Aromatase inhibitor counseling: We discussed the risks and benefits of anti-estrogen therapy with aromatase inhibitors. These include but not limited to  insomnia, hot flashes, mood changes, vaginal dryness, bone density loss, and weight gain. Although rare, serious side effects including endometrial cancer, risk of blood clots were also discussed. We strongly believe that the benefits far outweigh the risks. Patient understands these risks and consented to starting treatment. Planned treatment duration is 5 years.  Return to clinic after surgery for followup.   All questions were answered. The patient knows to call the clinic with any problems, questions or concerns. I spent 40 minutes counseling the patient face to face. The total time spent in the appointment was 60 minutes and more than 50% was on counseling.     Rulon Eisenmenger, MD 07/16/2014 3:54 PM

## 2014-07-16 NOTE — Assessment & Plan Note (Signed)
Left breast invasive ductal carcinoma 0.8 cm T1 B. N0 M0 stage IA clinical stage, ER 90%, PR 90%, HER-2 negative, ratio 1.34, Ki-67 10%  Radiology and pathology counseling:Discussed with the patient, the details of pathology including the type of breast cancer,the clinical staging, the significance of ER, PR and HER-2/neu receptors and the implications for treatment. After reviewing the pathology in detail, we proceeded to discuss the different treatment options between surgery, radiation, chemotherapy, antiestrogen therapies.  Recommendation: Lumpectomy followed by antiestrogen therapy with anastrozole 1 mg daily for 5 years  Aromatase inhibitor counseling: We discussed the risks and benefits of anti-estrogen therapy with aromatase inhibitors. These include but not limited to insomnia, hot flashes, mood changes, vaginal dryness, bone density loss, and weight gain. Although rare, serious side effects including endometrial cancer, risk of blood clots were also discussed. We strongly believe that the benefits far outweigh the risks. Patient understands these risks and consented to starting treatment. Planned treatment duration is 5 years.  Return to clinic after surgery for followup.

## 2014-07-16 NOTE — Progress Notes (Signed)
Checked in new patient with no issues prior to seeing the dr. She has appt card and [facility] packet. She will call me if asst is needed wth alight. She has not been traveling.

## 2014-07-17 ENCOUNTER — Encounter: Payer: Self-pay | Admitting: General Practice

## 2014-07-17 ENCOUNTER — Other Ambulatory Visit (INDEPENDENT_AMBULATORY_CARE_PROVIDER_SITE_OTHER): Payer: Self-pay | Admitting: General Surgery

## 2014-07-17 DIAGNOSIS — C50912 Malignant neoplasm of unspecified site of left female breast: Secondary | ICD-10-CM

## 2014-07-17 NOTE — Progress Notes (Signed)
[facility] Psychosocial Distress Screening Spiritual Care  Visited with Jasmin Holloway and her husband Jasmin Holloway in breast clinic to introduce [facility] team/resources, and to review distress screen per protocol.  The patient scored a 6 on the Psychosocial Distress Thermometer which indicates moderate distress. Also assessed for distress and other psychosocial needs.   ONCBCN DISTRESS SCREENING 07/17/2014  Screening Type Initial Screening  Distress experienced in past week (1-10) 6  Practical problem type Housing  Emotional problem type Nervousness/Anxiety;Adjusting to illness  Information Concerns Type Lack of info about maintaining fitness  Physical Problem type Sleep/insomnia  Referral to clinical social work Yes  Referral to support programs Yes  Other Spiritual Care   Per pt, her distress re dx is reduced now that she has received good news and more details about tx plan; her biggest stressor is that couple's house is on the market (since July).  She reports good support from friends and church (couple has attending [employer] for 47 years).  Follow up needed: No.  Provided info and means to contact [place] team members as desired, but please also page as needs arise;  [phone].  Thank you.  [place], [place]

## 2014-07-17 NOTE — Progress Notes (Signed)
Note created by Dr. Lindi Adie during office visit copy to patient, original to scan.

## 2014-07-21 ENCOUNTER — Telehealth: Payer: Self-pay | Admitting: Hematology and Oncology

## 2014-07-21 NOTE — Telephone Encounter (Signed)
, °

## 2014-07-23 ENCOUNTER — Telehealth: Payer: Self-pay | Admitting: *Deleted

## 2014-07-23 NOTE — Telephone Encounter (Signed)
Left message for a return phone call from [place] 07/16/14.  Awaiting patient response.

## 2014-07-25 ENCOUNTER — Encounter (HOSPITAL_BASED_OUTPATIENT_CLINIC_OR_DEPARTMENT_OTHER): Payer: Self-pay | Admitting: *Deleted

## 2014-07-25 NOTE — Progress Notes (Signed)
Labs done 07/16/14-ekg 9/15-had a stress test 9/15-low risk-last echo good

## 2014-08-04 ENCOUNTER — Ambulatory Visit
Admission: RE | Admit: 2014-08-04 | Discharge: 2014-08-04 | Disposition: A | Discharge status: Home / Self Care | Payer: Medicare Other | Source: Ambulatory Visit | Attending: General Surgery | Admitting: General Surgery

## 2014-08-04 DIAGNOSIS — C50912 Malignant neoplasm of unspecified site of left female breast: Secondary | ICD-10-CM | POA: Diagnosis not present

## 2014-08-05 ENCOUNTER — Encounter (HOSPITAL_BASED_OUTPATIENT_CLINIC_OR_DEPARTMENT_OTHER): Payer: Self-pay | Admitting: Anesthesiology

## 2014-08-05 ENCOUNTER — Encounter (HOSPITAL_BASED_OUTPATIENT_CLINIC_OR_DEPARTMENT_OTHER)
Admission: RE | Disposition: A | Discharge status: Home / Self Care | Payer: Self-pay | Source: Ambulatory Visit | Attending: General Surgery

## 2014-08-05 ENCOUNTER — Ambulatory Visit (HOSPITAL_BASED_OUTPATIENT_CLINIC_OR_DEPARTMENT_OTHER)
Admission: RE | Admit: 2014-08-05 | Discharge: 2014-08-05 | Disposition: A | Discharge status: Home / Self Care | Payer: Medicare Other | Source: Ambulatory Visit | Attending: General Surgery | Admitting: General Surgery

## 2014-08-05 ENCOUNTER — Ambulatory Visit (HOSPITAL_BASED_OUTPATIENT_CLINIC_OR_DEPARTMENT_OTHER): Payer: Medicare Other | Admitting: Anesthesiology

## 2014-08-05 ENCOUNTER — Ambulatory Visit
Admission: RE | Admit: 2014-08-05 | Discharge: 2014-08-05 | Disposition: A | Discharge status: Home / Self Care | Payer: Medicare Other | Source: Ambulatory Visit | Attending: General Surgery | Admitting: General Surgery

## 2014-08-05 DIAGNOSIS — G43909 Migraine, unspecified, not intractable, without status migrainosus: Secondary | ICD-10-CM | POA: Diagnosis not present

## 2014-08-05 DIAGNOSIS — Z836 Family history of other diseases of the respiratory system: Secondary | ICD-10-CM | POA: Diagnosis not present

## 2014-08-05 DIAGNOSIS — Z833 Family history of diabetes mellitus: Secondary | ICD-10-CM | POA: Insufficient documentation

## 2014-08-05 DIAGNOSIS — Z85820 Personal history of malignant melanoma of skin: Secondary | ICD-10-CM | POA: Insufficient documentation

## 2014-08-05 DIAGNOSIS — E780 Pure hypercholesterolemia: Secondary | ICD-10-CM | POA: Insufficient documentation

## 2014-08-05 DIAGNOSIS — F1099 Alcohol use, unspecified with unspecified alcohol-induced disorder: Secondary | ICD-10-CM | POA: Insufficient documentation

## 2014-08-05 DIAGNOSIS — Z8261 Family history of arthritis: Secondary | ICD-10-CM | POA: Insufficient documentation

## 2014-08-05 DIAGNOSIS — C50212 Malignant neoplasm of upper-inner quadrant of left female breast: Secondary | ICD-10-CM | POA: Insufficient documentation

## 2014-08-05 DIAGNOSIS — I999 Unspecified disorder of circulatory system: Secondary | ICD-10-CM | POA: Insufficient documentation

## 2014-08-05 DIAGNOSIS — M1990 Unspecified osteoarthritis, unspecified site: Secondary | ICD-10-CM | POA: Diagnosis not present

## 2014-08-05 DIAGNOSIS — N6012 Diffuse cystic mastopathy of left breast: Secondary | ICD-10-CM | POA: Diagnosis not present

## 2014-08-05 DIAGNOSIS — N63 Unspecified lump in breast: Secondary | ICD-10-CM | POA: Insufficient documentation

## 2014-08-05 DIAGNOSIS — K219 Gastro-esophageal reflux disease without esophagitis: Secondary | ICD-10-CM | POA: Insufficient documentation

## 2014-08-05 DIAGNOSIS — K649 Unspecified hemorrhoids: Secondary | ICD-10-CM | POA: Diagnosis not present

## 2014-08-05 DIAGNOSIS — C50912 Malignant neoplasm of unspecified site of left female breast: Secondary | ICD-10-CM | POA: Diagnosis not present

## 2014-08-05 DIAGNOSIS — Z170 Estrogen receptor positive status [ER+]: Secondary | ICD-10-CM | POA: Diagnosis not present

## 2014-08-05 DIAGNOSIS — Z86010 Personal history of colonic polyps: Secondary | ICD-10-CM | POA: Insufficient documentation

## 2014-08-05 DIAGNOSIS — D0512 Intraductal carcinoma in situ of left breast: Secondary | ICD-10-CM | POA: Diagnosis not present

## 2014-08-05 DIAGNOSIS — I10 Essential (primary) hypertension: Secondary | ICD-10-CM | POA: Diagnosis not present

## 2014-08-05 DIAGNOSIS — F419 Anxiety disorder, unspecified: Secondary | ICD-10-CM | POA: Insufficient documentation

## 2014-08-05 HISTORY — PX: BREAST LUMPECTOMY: SHX2

## 2014-08-05 HISTORY — PX: BREAST LUMPECTOMY WITH RADIOACTIVE SEED LOCALIZATION: SHX6424

## 2014-08-05 HISTORY — DX: Gastro-esophageal reflux disease without esophagitis: K21.9

## 2014-08-05 SURGERY — BREAST LUMPECTOMY WITH RADIOACTIVE SEED LOCALIZATION
Anesthesia: General | Laterality: Left

## 2014-08-05 MED ORDER — MEPERIDINE HCL 25 MG/ML IJ SOLN: 6.2500 mg | INTRAMUSCULAR | Status: DC | PRN

## 2014-08-05 MED ORDER — HYDROCODONE-ACETAMINOPHEN 5-325 MG PO TABS: 1.0000 | ORAL_TABLET | ORAL | Status: DC | PRN

## 2014-08-05 MED ORDER — PROMETHAZINE HCL 25 MG/ML IJ SOLN
6.2500 mg | Freq: Once | INTRAMUSCULAR | Status: AC
  Administered 2014-08-05: 6.25 mg via INTRAVENOUS

## 2014-08-05 MED ORDER — CEFAZOLIN SODIUM-DEXTROSE 2-3 GM-% IV SOLR
INTRAVENOUS | Status: DC | PRN
  Administered 2014-08-05: 2 g via INTRAVENOUS

## 2014-08-05 MED ORDER — FENTANYL CITRATE 0.05 MG/ML IJ SOLN: 50.0000 ug | INTRAMUSCULAR | Status: DC | PRN

## 2014-08-05 MED ORDER — HYDROMORPHONE HCL 1 MG/ML IJ SOLN
0.2500 mg | INTRAMUSCULAR | Status: DC | PRN
  Administered 2014-08-05 (×2): 0.5 mg via INTRAVENOUS

## 2014-08-05 MED ORDER — FENTANYL CITRATE 0.05 MG/ML IJ SOLN
INTRAMUSCULAR | Status: AC
  Filled 2014-08-05: qty 6

## 2014-08-05 MED ORDER — PROPOFOL 10 MG/ML IV BOLUS
INTRAVENOUS | Status: DC | PRN
  Administered 2014-08-05: 150 mg via INTRAVENOUS

## 2014-08-05 MED ORDER — FENTANYL CITRATE 0.05 MG/ML IJ SOLN
INTRAMUSCULAR | Status: DC | PRN
  Administered 2014-08-05 (×2): 50 ug via INTRAVENOUS

## 2014-08-05 MED ORDER — OXYCODONE HCL 5 MG PO TABS: 5.0000 mg | ORAL_TABLET | Freq: Once | ORAL | Status: DC | PRN

## 2014-08-05 MED ORDER — ONDANSETRON HCL 4 MG/2ML IJ SOLN
4.0000 mg | Freq: Once | INTRAMUSCULAR | Status: DC | PRN
Start: 2014-08-05 — End: 2014-08-05

## 2014-08-05 MED ORDER — 0.9 % SODIUM CHLORIDE (POUR BTL) OPTIME
TOPICAL | Status: DC | PRN
  Administered 2014-08-05: 400 mL

## 2014-08-05 MED ORDER — LIDOCAINE HCL (CARDIAC) 20 MG/ML IV SOLN
INTRAVENOUS | Status: DC | PRN
  Administered 2014-08-05: 50 mg via INTRAVENOUS

## 2014-08-05 MED ORDER — CEFAZOLIN SODIUM-DEXTROSE 2-3 GM-% IV SOLR: 2.0000 g | INTRAVENOUS | Status: DC

## 2014-08-05 MED ORDER — OXYCODONE HCL 5 MG/5ML PO SOLN: 5.0000 mg | Freq: Once | ORAL | Status: DC | PRN

## 2014-08-05 MED ORDER — HYDROMORPHONE HCL 1 MG/ML IJ SOLN
INTRAMUSCULAR | Status: AC
  Filled 2014-08-05: qty 1

## 2014-08-05 MED ORDER — CHLORHEXIDINE GLUCONATE 4 % EX LIQD: 1.0000 "application " | Freq: Once | CUTANEOUS | Status: DC

## 2014-08-05 MED ORDER — ONDANSETRON HCL 4 MG/2ML IJ SOLN
INTRAMUSCULAR | Status: DC | PRN
  Administered 2014-08-05: 4 mg via INTRAVENOUS

## 2014-08-05 MED ORDER — PHENYLEPHRINE HCL 10 MG/ML IJ SOLN
INTRAMUSCULAR | Status: DC | PRN
  Administered 2014-08-05: 40 ug via INTRAVENOUS

## 2014-08-05 MED ORDER — MIDAZOLAM HCL 2 MG/2ML IJ SOLN
INTRAMUSCULAR | Status: AC
  Filled 2014-08-05: qty 2

## 2014-08-05 MED ORDER — CEFAZOLIN SODIUM-DEXTROSE 2-3 GM-% IV SOLR
INTRAVENOUS | Status: AC
  Filled 2014-08-05: qty 50

## 2014-08-05 MED ORDER — BUPIVACAINE-EPINEPHRINE (PF) 0.25% -1:200000 IJ SOLN
INTRAMUSCULAR | Status: AC
  Filled 2014-08-05: qty 30

## 2014-08-05 MED ORDER — SODIUM CHLORIDE 0.9 % IJ SOLN
INTRAMUSCULAR | Status: AC
  Filled 2014-08-05: qty 10

## 2014-08-05 MED ORDER — MIDAZOLAM HCL 2 MG/2ML IJ SOLN: 1.0000 mg | INTRAMUSCULAR | Status: DC | PRN

## 2014-08-05 MED ORDER — PROMETHAZINE HCL 25 MG/ML IJ SOLN
INTRAMUSCULAR | Status: AC
  Filled 2014-08-05: qty 1

## 2014-08-05 MED ORDER — GLYCOPYRROLATE 0.2 MG/ML IJ SOLN
INTRAMUSCULAR | Status: DC | PRN
  Administered 2014-08-05: 0.2 mg via INTRAVENOUS

## 2014-08-05 MED ORDER — BUPIVACAINE-EPINEPHRINE (PF) 0.25% -1:200000 IJ SOLN
INTRAMUSCULAR | Status: DC | PRN
  Administered 2014-08-05: 16 mL

## 2014-08-05 MED ORDER — LACTATED RINGERS IV SOLN
INTRAVENOUS | Status: DC
  Administered 2014-08-05: 08:00:00 via INTRAVENOUS

## 2014-08-05 MED ORDER — DEXAMETHASONE SODIUM PHOSPHATE 4 MG/ML IJ SOLN
INTRAMUSCULAR | Status: DC | PRN
  Administered 2014-08-05: 10 mg via INTRAVENOUS

## 2014-08-05 SURGICAL SUPPLY — 54 items
APPLIER CLIP 9.375 MED OPEN (MISCELLANEOUS)
BINDER BREAST LRG (GAUZE/BANDAGES/DRESSINGS) ×3 IMPLANT
BINDER BREAST MEDIUM (GAUZE/BANDAGES/DRESSINGS) IMPLANT
BINDER BREAST XLRG (GAUZE/BANDAGES/DRESSINGS) IMPLANT
BINDER BREAST XXLRG (GAUZE/BANDAGES/DRESSINGS) IMPLANT
BLADE SURG 15 STRL LF DISP TIS (BLADE) ×1 IMPLANT
BLADE SURG 15 STRL SS (BLADE) ×2
CANISTER SUC SOCK COL 7IN (MISCELLANEOUS) IMPLANT
CANISTER SUCT 1200ML W/VALVE (MISCELLANEOUS) IMPLANT
CHLORAPREP W/TINT 26ML (MISCELLANEOUS) ×3 IMPLANT
CLIP APPLIE 9.375 MED OPEN (MISCELLANEOUS) IMPLANT
CLIP TI WIDE RED SMALL 6 (CLIP) ×3 IMPLANT
COVER BACK TABLE 60X90IN (DRAPES) ×3 IMPLANT
COVER MAYO STAND STRL (DRAPES) ×3 IMPLANT
COVER PROBE W GEL 5X96 (DRAPES) ×3 IMPLANT
DECANTER SPIKE VIAL GLASS SM (MISCELLANEOUS) IMPLANT
DEVICE DUBIN W/COMP PLATE 8390 (MISCELLANEOUS) ×3 IMPLANT
DRAPE LAPAROSCOPIC ABDOMINAL (DRAPES) IMPLANT
DRAPE UTILITY XL STRL (DRAPES) ×3 IMPLANT
ELECT COATED BLADE 2.86 ST (ELECTRODE) ×3 IMPLANT
ELECT REM PT RETURN 9FT ADLT (ELECTROSURGICAL) ×3
ELECTRODE REM PT RTRN 9FT ADLT (ELECTROSURGICAL) ×1 IMPLANT
GLOVE BIOGEL PI IND STRL 7.0 (GLOVE) ×2 IMPLANT
GLOVE BIOGEL PI IND STRL 8 (GLOVE) ×1 IMPLANT
GLOVE BIOGEL PI INDICATOR 7.0 (GLOVE) ×4
GLOVE BIOGEL PI INDICATOR 8 (GLOVE) ×2
GLOVE ECLIPSE 6.5 STRL STRAW (GLOVE) ×3 IMPLANT
GLOVE SS BIOGEL STRL SZ 7.5 (GLOVE) ×2 IMPLANT
GLOVE SUPERSENSE BIOGEL SZ 7.5 (GLOVE) ×4
GLOVE SURG SIGNA 7.5 PF LTX (GLOVE) ×3 IMPLANT
GOWN STRL REUS W/ TWL LRG LVL3 (GOWN DISPOSABLE) ×1 IMPLANT
GOWN STRL REUS W/ TWL XL LVL3 (GOWN DISPOSABLE) ×2 IMPLANT
GOWN STRL REUS W/TWL LRG LVL3 (GOWN DISPOSABLE) ×2
GOWN STRL REUS W/TWL XL LVL3 (GOWN DISPOSABLE) ×4
KIT MARKER MARGIN INK (KITS) ×3 IMPLANT
LIQUID BAND (GAUZE/BANDAGES/DRESSINGS) ×3 IMPLANT
NDL SAFETY ECLIPSE 18X1.5 (NEEDLE) IMPLANT
NEEDLE HYPO 18GX1.5 SHARP (NEEDLE)
NEEDLE HYPO 25X1 1.5 SAFETY (NEEDLE) ×3 IMPLANT
NS IRRIG 1000ML POUR BTL (IV SOLUTION) ×3 IMPLANT
PACK BASIN DAY SURGERY FS (CUSTOM PROCEDURE TRAY) ×3 IMPLANT
PENCIL BUTTON HOLSTER BLD 10FT (ELECTRODE) ×3 IMPLANT
SLEEVE SCD COMPRESS KNEE MED (MISCELLANEOUS) ×3 IMPLANT
SPONGE LAP 4X18 X RAY DECT (DISPOSABLE) ×3 IMPLANT
SUT MNCRL AB 4-0 PS2 18 (SUTURE) ×3 IMPLANT
SUT SILK 2 0 SH (SUTURE) IMPLANT
SUT VIC AB 3-0 SH 27 (SUTURE) ×2
SUT VIC AB 3-0 SH 27X BRD (SUTURE) ×1 IMPLANT
SYR CONTROL 10ML LL (SYRINGE) ×3 IMPLANT
TOWEL OR 17X24 6PK STRL BLUE (TOWEL DISPOSABLE) ×3 IMPLANT
TOWEL OR NON WOVEN STRL DISP B (DISPOSABLE) ×3 IMPLANT
TUBE CONNECTING 20'X1/4 (TUBING)
TUBE CONNECTING 20X1/4 (TUBING) IMPLANT
YANKAUER SUCT BULB TIP NO VENT (SUCTIONS) IMPLANT

## 2014-08-05 NOTE — Anesthesia Preprocedure Evaluation (Signed)
Anesthesia Evaluation  Patient identified by MRN, date of birth, ID band Patient awake    Reviewed: Allergy & Precautions, H&P , NPO status , Patient's Chart, lab work & pertinent test results  Airway Mallampati: I  TM Distance: >3 FB Neck ROM: Full    Dental   Pulmonary          Cardiovascular hypertension, Pt. on medications + CAD and + Cardiac Stents     Neuro/Psych    GI/Hepatic GERD-  Controlled and Medicated,  Endo/Other    Renal/GU      Musculoskeletal   Abdominal   Peds  Hematology   Anesthesia Other Findings   Reproductive/Obstetrics                             Anesthesia Physical Anesthesia Plan  ASA: III  Anesthesia Plan: General   Post-op Pain Management:    Induction: Intravenous  Airway Management Planned: LMA  Additional Equipment:   Intra-op Plan:   Post-operative Plan: Extubation in OR  Informed Consent: I have reviewed the patients History and Physical, chart, labs and discussed the procedure including the risks, benefits and alternatives for the proposed anesthesia with the patient or authorized representative who has indicated his/her understanding and acceptance.     Plan Discussed with: CRNA and Surgeon  Anesthesia Plan Comments:         Anesthesia Quick Evaluation

## 2014-08-05 NOTE — Interval H&P Note (Signed)
History and Physical Interval Note:  08/05/2014 8:40 AM  Jasmin Holloway  has presented today for surgery, with the diagnosis of cancer left breast  The various methods of treatment have been discussed with the patient and family. After consideration of risks, benefits and other options for treatment, the patient has consented to  Procedure(s): SEED LOCALIZED LEFT BREAST LUMPECTOMY (Left) as a surgical intervention .  The patient's history has been reviewed, patient examined, no change in status, stable for surgery.  I have reviewed the patient's chart and labs.  Questions were answered to the patient's satisfaction.     Juan Kissoon T

## 2014-08-05 NOTE — Op Note (Signed)
Preoperative Diagnosis: cancer left breast  Postoprative Diagnosis: cancer left breast  Procedure: Procedure(s): SEED LOCALIZED LEFT BREAST LUMPECTOMY   Surgeon: Excell Seltzer T   Assistants: Alphonsa Overall  Anesthesia:  General LMA anesthesia  Indications: Patient is a 80 year old female with a recent diagnosis of low-grade invasive ductal carcinoma of the left breast 0.8 cm on MRI. After extensive discussion regarding treatment options and risks detailed elsewhere we elected to proceed with seed localized left breast lumpectomy as initial surgical therapy.    Procedure Detail:  The patient had undergone accurate radioactive seed localization the day prior to surgery. Prior to surgery confirmed high counts in the left breast at the expected position of the tumor and confirmed visualization of the seed on her specimen mammogram in the upper inner left breast. She was brought to the operating room, placed in the supine position on the operating table, and laryngeal mask general anesthesia induced. The left breast was widely sterilely prepped and draped. She received preoperative IV antibiotics. PAS were in place. Patient timeout was performed and correct procedure verified. The site of maximum counts was identified and a curvilinear circumareolar incision was made and dissection carried down through the subcutaneous Tissue toward the breast capsule. Short skin and subcutaneous flaps were raised. Using the neoprobe for guidance I then excised a generous specimen of breast tissue with cautery and working posteriorly and confirming with the neoprobe we were posterior to the seed. There was palpable firm tissue in the area. The specimen was removed and the seed confirmed within the specimen with the neoprobe. Specimen x-ray showed the seed and the marking clip and the mass reasonably centrally located within the specimen a little bit to the medial side. This was oriented with ink and sent for  permanent pathology. I then excised a further medial margin about 5 mm and this was oriented with suture and sent for permanent section. Hemostasis was obtained with cautery and the wound irrigated. The soft tissue was infiltrated with Marcaine. The lumpectomy cavity was marked with clips. The breast and subcutaneous tissue was closed with interrupted 3-0 Vicryl. Skin was closed with subcuticular 4-0 Monocryl and Dermabond. Sponge needle and instrument counts were correct.    Findings: As above  Estimated Blood Loss:  Minimal         Drains: none  Blood Given: none          Specimens: #1 left breast lumpectomy     #2 further medial margin, oriented        Complications:  * No complications entered in OR log *         Disposition: PACU - hemodynamically stable.         Condition: stable

## 2014-08-05 NOTE — H&P (Signed)
History of Present Illness Marland Kitchen T. Yazlin Ekblad MD; 07/16/2014 2:36 PM) The patient is a 80 year old female who presents with breast cancer. Patint is a 80 YO post menopausal female referred by Dr. Nolon Nations for evaluation of recently diagnosed carcinoma of the left breast. a screening mammogram 6 months previously revealed a small area of distortion on one view and 6 month follow-up was recommended. Recenton she presented for this and the area of distortion persisted seen only on craniocaudal view. MRI was recommended for further evaluation which revealed an 8 mm mass. Subsequent MR guided biopsy was performed showing invasive ductal carcinoma, low-grade. She is seen now in breast multidisciplinary clinic for initial treatment planning. She has experienced no breaarge or inversion or skin changesain, nipple discharge or inversion or skin changes. She does not have a personal history of any previous breast problems. Findings at that time were the following: Tumor size: 0.8 cm Tumor grade: 1 Estrogen Receptor: positive Progesterone Receptor: positive Her-2 neu: negative Lymph node status: negative   Other Problems Marlowe Kays Goettsch; 07/16/2014 7:44 AM) Anxiety Disorder Arthritis Gastroesophageal Reflux Disease Hemorrhoids High blood pressure Hypercholesterolemia Lump In Breast Melanoma Migraine Headache Vascular Disease  Past Surgical History Marlowe Kays Goettsch; 07/16/2014 7:44 AM) Appendectomy Breast Biopsy Left. Cataract Surgery Bilateral. Colon Polyp Removal - Colonoscopy Oral Surgery Tonsillectomy  Diagnostic Studies History Mirian Mo; 07/16/2014 7:44 AM) Colonoscopy 1-5 years ago Mammogram within last year Pap Smear 1-5 years ago  Social History Marlowe Kays Goettsch; 07/16/2014 7:44 AM) Alcohol use Moderate alcohol use. Caffeine use Carbonated beverages. No drug use Tobacco use Never smoker.  Family History Mirian Mo; 07/16/2014 7:44  AM) Arthritis Father, Sister. Colon Polyps Mother. Diabetes Mellitus Father. Heart Disease Father. Migraine Headache Mother. Respiratory Condition Mother.  Pregnancy / Birth History Mirian Mo; 07/16/2014 7:44 AM) Age at menarche 11 years. Age of menopause 83-55 Gravida 3 Irregular periods Maternal age 59-30 Para 2  Review of Systems Mirian Mo; 07/16/2014 7:44 AM) General Present- Night Sweats. Not Present- Appetite Loss, Chills, Fatigue, Fever, Weight Gain and Weight Loss. HEENT Present- Wears glasses/contact lenses. Not Present- Earache, Hearing Loss, Hoarseness, Nose Bleed, Oral Ulcers, Ringing in the Ears, Seasonal Allergies, Sinus Pain, Sore Throat, Visual Disturbances and Yellow Eyes. Respiratory Present- Snoring. Not Present- Bloody sputum, Chronic Cough, Difficulty Breathing and Wheezing. Breast Present- Breast Mass. Not Present- Breast Pain, Nipple Discharge and Skin Changes. Cardiovascular Not Present- Chest Pain, Difficulty Breathing Lying Down, Leg Cramps, Palpitations, Rapid Heart Rate, Shortness of Breath and Swelling of Extremities. Gastrointestinal Present- Constipation and Hemorrhoids. Not Present- Abdominal Pain, Bloating, Bloody Stool, Change in Bowel Habits, Chronic diarrhea, Difficulty Swallowing, Excessive gas, Gets full quickly at meals, Indigestion, Nausea, Rectal Pain and Vomiting. Musculoskeletal Not Present- Back Pain, Joint Pain, Joint Stiffness, Muscle Pain, Muscle Weakness and Swelling of Extremities. Neurological Present- Decreased Memory. Not Present- Fainting, Headaches, Numbness, Seizures, Tingling, Tremor, Trouble walking and Weakness. Psychiatric Present- Anxiety and Frequent crying. Not Present- Bipolar, Change in Sleep Pattern, Depression and Fearful. Endocrine Present- Heat Intolerance. Not Present- Cold Intolerance, Excessive Hunger, Hair Changes, Hot flashes and New Diabetes. Hematology Not Present- Easy Bruising, Excessive  bleeding, Gland problems, HIV and Persistent Infections.   Physical Exam Marland Kitchen T. Jahmiyah Dullea MD; 07/16/2014 2:38 PM) The physical exam findings are as follows: Note:General: Alert, well-developed and well nourished older Caucasian female, in no distress Skin: Warm and dry without rash or infection. HEENT: No palpable masses or thyromegaly. Sclera nonicteric. Pupils equal round and reactive. Oropharynx clear. breasts: Bruising and  thickening in the upper inner left breast consistent with bihpsy. No other palpable abnormalities. No skin changes or nipple inversion. Lymph nodes: No cervical, supraclavicular, or inguinal nodes palpable. Lungs: Breath sounds clear and equal. No wheezing or increased work of breathing. Cardiovascular: Regular rate and rhythm without murmer. No JVD or edema. Peripheral pulses intact. No carotid bruits. Abdomen: Nondistended. Soft and nontender. No masses palpable. No organomegaly. No palpable hernias. Extremities: No edema or joint swelling or deformity. No chronic venous stasis changes. Neurologic: Alert and fully oriented. Gait normal. No focal weakness. Psychiatric: Normal mood and affect. Thought content appropriate with normal judgement and insight    Assessment & Plan Marland Kitchen T. Manus Weedman MD; 07/16/2014 2:42 PM) BREAST CANCER, LEFT (174.9  C50.912) Impression: 79 year old female with a new diagnosis of cancer of the left breast, upper inner quadrant. Clinical stage 1a, ER+, PR+, HER-2-. I discussed with the patient and family members present today initial surgical treatment options. We discussed options of breast conservation with lumpectomy or total mastectomy and sentinal lymph node biopsy/dissection. Options for reconstruction were discussed. After discussion they have elected to proceed with seed or needle localized left breast lumpectomy. after discussion with medical oncology today we will not plan Sentinel lymph node biopsy as this would not alter her  therapy. We discussed the indications and nature of the procedure, and expected recovery, in detail. Surgical risks including anesthetic complications, cardiorespiratory complications, bleeding, infection, wound healing complications, blood clots, lymphedema, local and distant recurrence and possible need for further surgery based on the final pathology was discussed and understood. Chemotherapy, hormonal therapy and radiation therapy have been discussed. They have been provided with literature regarding the treatment of breast cancer. All questions were answered. They understand and agree to proceed and we will go ahead with scheduling. Current Plans  Schedule for Surgery seed or needle localized left breast lumpectomy

## 2014-08-05 NOTE — Discharge Instructions (Signed)
[facility] °Office Phone Number [phone] ° °BREAST BIOPSY/ PARTIAL MASTECTOMY: POST OP INSTRUCTIONS ° °Always review your discharge instruction sheet given to you by the facility where your surgery was performed. ° °IF YOU HAVE DISABILITY OR FAMILY LEAVE FORMS, YOU MUST BRING THEM TO THE OFFICE FOR PROCESSING.  DO NOT GIVE THEM TO YOUR DOCTOR. ° °1. A prescription for pain medication may be given to you upon discharge.  Take your pain medication as prescribed, if needed.  If narcotic pain medicine is not needed, then you may take acetaminophen (Tylenol) or ibuprofen (Advil) as needed. °2. Take your usually prescribed medications unless otherwise directed °3. If you need a refill on your pain medication, please contact your pharmacy.  They will contact our office to request authorization.  Prescriptions will not be filled after 5pm or on week-ends. °4. You should eat very light the first 24 hours after surgery, such as soup, crackers, pudding, etc.  Resume your normal diet the day after surgery. °5. Most patients will experience some swelling and bruising in the breast.  Ice packs and a good support bra will help.  Swelling and bruising can take several days to resolve.  °6. It is common to experience some constipation if taking pain medication after surgery.  Increasing fluid intake and taking a stool softener will usually help or prevent this problem from occurring.  A mild laxative (Milk of Magnesia or Miralax) should be taken according to package directions if there are no bowel movements after 48 hours. °7. Unless discharge instructions indicate otherwise, you may remove your bandages 24-48 hours after surgery, and you may shower at that time.  You may have steri-strips (small skin tapes) in place directly over the incision.  These strips should be left on the skin for 7-10 days.  If your surgeon used skin glue on the incision, you may shower in 24 hours.  The glue will flake off over the  next 2-3 weeks.  Any sutures or staples will be removed at the office during your follow-up visit. °8. ACTIVITIES:  You may resume regular daily activities (gradually increasing) beginning the next day.  Wearing a good support bra or sports bra minimizes pain and swelling.  You may have sexual intercourse when it is comfortable. °a. You may drive when you no longer are taking prescription pain medication, you can comfortably wear a seatbelt, and you can safely maneuver your car and apply brakes. °b. RETURN TO WORK:  ______________________________________________________________________________________ °9. You should see your doctor in the office for a follow-up appointment approximately two weeks after your surgery.  Your doctor’s nurse will typically make your follow-up appointment when she calls you with your pathology report.  Expect your pathology report 2-3 business days after your surgery.  You may call to check if you do not hear from us after three days. °10. OTHER INSTRUCTIONS: _______________________________________________________________________________________________ _____________________________________________________________________________________________________________________________________ °_____________________________________________________________________________________________________________________________________ °_____________________________________________________________________________________________________________________________________ ° °WHEN TO CALL YOUR DOCTOR: °1. Fever over 101.0 °2. Nausea and/or vomiting. °3. Extreme swelling or bruising. °4. Continued bleeding from incision. °5. Increased pain, redness, or drainage from the incision. ° °The clinic staff is available to answer your questions during regular business hours.  Please don’t hesitate to call and ask to speak to one of the nurses for clinical concerns.  If you have a medical emergency, go to the nearest  emergency room or call 911.  A surgeon from [facility] is always on call at the hospital. ° °For further questions, please visit [email or web address]  ° ° °  Post Anesthesia Home Care Instructions ° °Activity: °Get plenty of rest for the remainder of the day. A responsible adult should stay with you for 24 hours following the procedure.  °For the next 24 hours, DO NOT: °-Drive a car °-Operate machinery °-Drink alcoholic beverages °-Take any medication unless instructed by your physician °-Make any legal decisions or sign important papers. ° °Meals: °Start with liquid foods such as gelatin or soup. Progress to regular foods as tolerated. Avoid greasy, spicy, heavy foods. If nausea and/or vomiting occur, drink only clear liquids until the nausea and/or vomiting subsides. Call your physician if vomiting continues. ° °Special Instructions/Symptoms: °Your throat may feel dry or sore from the anesthesia or the breathing tube placed in your throat during surgery. If this causes discomfort, gargle with warm salt water. The discomfort should disappear within 24 hours. ° °

## 2014-08-05 NOTE — Transfer of Care (Signed)
Immediate Anesthesia Transfer of Care Note  Patient: Jasmin Holloway  Procedure(s) Performed: Procedure(s): SEED LOCALIZED LEFT BREAST LUMPECTOMY (Left)  Patient Location: PACU  Anesthesia Type:General  Level of Consciousness: awake and patient cooperative  Airway & Oxygen Therapy: Patient Spontanous Breathing and Patient connected to face mask oxygen  Post-op Assessment: Report given to PACU RN and Post -op Vital signs reviewed and stable  Post vital signs: Reviewed and stable  Complications: No apparent anesthesia complications

## 2014-08-05 NOTE — Anesthesia Postprocedure Evaluation (Signed)
Anesthesia Post Note  Patient: Jasmin Holloway  Procedure(s) Performed: Procedure(s) (LRB): SEED LOCALIZED LEFT BREAST LUMPECTOMY (Left)  Anesthesia type: general  Patient location: PACU  Post pain: Pain level controlled  Post assessment: Patient's Cardiovascular Status Stable  Last Vitals:  Filed Vitals:   08/05/14 1215  BP:   Pulse: 81  Temp:   Resp: 12    Post vital signs: Reviewed and stable  Level of consciousness: sedated  Complications: No apparent anesthesia complications

## 2014-08-06 ENCOUNTER — Encounter (HOSPITAL_BASED_OUTPATIENT_CLINIC_OR_DEPARTMENT_OTHER): Payer: Self-pay | Admitting: General Surgery

## 2014-08-08 ENCOUNTER — Telehealth (INDEPENDENT_AMBULATORY_CARE_PROVIDER_SITE_OTHER): Payer: Self-pay | Admitting: General Surgery

## 2014-08-08 NOTE — Telephone Encounter (Signed)
Call the patient and discussed pathology report.  She had a small invasive tumor but did have associated ductal carcinoma in situ that was focally positive at the inferior margin.  I discussed with her options which would include adding radiation therapy which was not originally planned or re-excising that margin.  She would prefer reexcision and I think thisis very reasonable.  I will get this scheduled for her.

## 2014-08-13 NOTE — Progress Notes (Signed)
Please put orders in Epic surgery 08-18-14 pre op 08-14-14 Thanks

## 2014-08-14 ENCOUNTER — Ambulatory Visit (HOSPITAL_COMMUNITY)
Admission: RE | Admit: 2014-08-14 | Discharge: 2014-08-14 | Disposition: A | Discharge status: Home / Self Care | Payer: Medicare Other | Source: Ambulatory Visit | Attending: Anesthesiology | Admitting: Anesthesiology

## 2014-08-14 ENCOUNTER — Encounter (HOSPITAL_COMMUNITY): Payer: Self-pay

## 2014-08-14 ENCOUNTER — Encounter (HOSPITAL_COMMUNITY)
Admission: RE | Admit: 2014-08-14 | Discharge: 2014-08-14 | Disposition: A | Discharge status: Home / Self Care | Payer: Medicare Other | Source: Ambulatory Visit | Attending: General Surgery | Admitting: General Surgery

## 2014-08-14 DIAGNOSIS — N63 Unspecified lump in breast: Secondary | ICD-10-CM | POA: Diagnosis not present

## 2014-08-14 DIAGNOSIS — Z01812 Encounter for preprocedural laboratory examination: Secondary | ICD-10-CM | POA: Diagnosis not present

## 2014-08-14 DIAGNOSIS — Z01818 Encounter for other preprocedural examination: Secondary | ICD-10-CM | POA: Insufficient documentation

## 2014-08-14 DIAGNOSIS — I10 Essential (primary) hypertension: Secondary | ICD-10-CM | POA: Diagnosis not present

## 2014-08-14 HISTORY — DX: Nausea with vomiting, unspecified: R11.2

## 2014-08-14 HISTORY — DX: Unspecified osteoarthritis, unspecified site: M19.90

## 2014-08-14 HISTORY — DX: Nausea with vomiting, unspecified: Z98.890

## 2014-08-14 LAB — BASIC METABOLIC PANEL
Anion gap: 9 (ref 5–15)
BUN: 17 mg/dL (ref 6–23)
CHLORIDE: 101 meq/L (ref 96–112)
CO2: 28 mEq/L (ref 19–32)
Calcium: 10.2 mg/dL (ref 8.4–10.5)
Creatinine, Ser: 0.81 mg/dL (ref 0.50–1.10)
GFR, EST AFRICAN AMERICAN: 79 mL/min — AB (ref >90)
GFR, EST NON AFRICAN AMERICAN: 68 mL/min — AB (ref >90)
Glucose, Bld: 99 mg/dL (ref 70–99)
POTASSIUM: 4.5 meq/L (ref 3.7–5.3)
Sodium: 138 mEq/L (ref 137–147)

## 2014-08-14 LAB — CBC
HCT: 39.7 % (ref 36.0–46.0)
Hemoglobin: 13.3 g/dL (ref 12.0–15.0)
MCH: 30.9 pg (ref 26.0–34.0)
MCHC: 33.5 g/dL (ref 30.0–36.0)
MCV: 92.3 fL (ref 78.0–100.0)
Platelets: 205 10*3/uL (ref 150–400)
RBC: 4.3 MIL/uL (ref 3.87–5.11)
RDW: 13.6 % (ref 11.5–15.5)
WBC: 7.8 10*3/uL (ref 4.0–10.5)

## 2014-08-14 NOTE — Patient Instructions (Signed)
Charleen Madera [facility]  08/14/2014   Your procedure is scheduled on: Monday  December 14th  Report to [place] at  8:20 AM.  Call this number if you have problems the morning of surgery [phone]   Remember:  Do not eat food or drink liquids :After Midnight.     Take these medicines the morning of surgery with A SIP OF WATER:   METOPROLOL                               You may not have any metal on your body including hair pins and              piercings  Do not wear jewelry, make-up, lotions, powders or perfumes.             Do not wear nail polish.  Do not shave  48 hours prior to surgery.              Men may shave face and neck.   Do not bring valuables to the hospital. [place].  Contacts, dentures or bridgework may not be worn into surgery.  Leave suitcase in the car. After surgery it may be brought to your room.     Patients discharged the day of surgery will not be allowed to drive home.  Name and phone number of your driver:  HUSBAND JOEL [phone]  Special Instructions: N/A              Please read over the following fact sheets you were given: _____________________________________________________________________             [facility] - Preparing for Surgery Before surgery, you can play an important role.  Because skin is not sterile, your skin needs to be as free of germs as possible.  You can reduce the number of germs on your skin by washing with CHG (chlorahexidine gluconate) soap before surgery.  CHG is an antiseptic cleaner which kills germs and bonds with the skin to continue killing germs even after washing. Please DO NOT use if you have an allergy to CHG or antibacterial soaps.  If your skin becomes reddened/irritated stop using the CHG and inform your nurse when you arrive at Short Stay. Do not shave (including legs and underarms) for at least 48 hours  prior to the first CHG shower.  You may shave your face/neck. Please follow these instructions carefully:  1.  Shower with CHG Soap the night before surgery and the  morning of Surgery.  2.  If you choose to wash your hair, wash your hair first as usual with your  normal  shampoo.  3.  After you shampoo, rinse your hair and body thoroughly to remove the  shampoo.                           4.  Use CHG as you would any other liquid soap.  You can apply chg directly  to the skin and wash  Gently with a scrungie or clean washcloth.  5.  Apply the CHG Soap to your body ONLY FROM THE NECK DOWN.   Do not use on face/ open                           Wound or open sores. Avoid contact with eyes, ears mouth and genitals (private parts).                       Wash face,  Genitals (private parts) with your normal soap.             6.  Wash thoroughly, paying special attention to the area where your surgery  will be performed.  7.  Thoroughly rinse your body with warm water from the neck down.  8.  DO NOT shower/wash with your normal soap after using and rinsing off  the CHG Soap.                9.  Pat yourself dry with a clean towel.            10.  Wear clean pajamas.            11.  Place clean sheets on your bed the night of your first shower and do not  sleep with pets. Day of Surgery : Do not apply any lotions/deodorants the morning of surgery.  Please wear clean clothes to the hospital/surgery center.  FAILURE TO FOLLOW THESE INSTRUCTIONS MAY RESULT IN THE CANCELLATION OF YOUR SURGERY PATIENT SIGNATURE_________________________________  NURSE SIGNATURE__________________________________  ________________________________________________________________________

## 2014-08-14 NOTE — Pre-Procedure Instructions (Signed)
CXR WAS DONE TODAY - PREOP AT [facility]. EKG REPORT AND CARDIOLOGY OFFICE NOTES IN EPIC DR. Gwenlyn Found.  NUCLEAR STRESS TEST REPORT IN EPIC 06-04-14 AND LAST ECHO IN EPIC 03-29-13.

## 2014-08-15 ENCOUNTER — Other Ambulatory Visit (INDEPENDENT_AMBULATORY_CARE_PROVIDER_SITE_OTHER): Payer: Self-pay | Admitting: General Surgery

## 2014-08-18 ENCOUNTER — Encounter (HOSPITAL_COMMUNITY)
Admission: RE | Disposition: A | Discharge status: Home / Self Care | Payer: Self-pay | Source: Ambulatory Visit | Attending: General Surgery

## 2014-08-18 ENCOUNTER — Ambulatory Visit (HOSPITAL_COMMUNITY): Payer: Medicare Other | Admitting: Registered Nurse

## 2014-08-18 ENCOUNTER — Encounter (HOSPITAL_COMMUNITY): Payer: Self-pay | Admitting: Registered Nurse

## 2014-08-18 ENCOUNTER — Other Ambulatory Visit: Payer: Self-pay | Admitting: *Deleted

## 2014-08-18 ENCOUNTER — Ambulatory Visit: Payer: Medicare Other | Admitting: Hematology and Oncology

## 2014-08-18 ENCOUNTER — Ambulatory Visit (HOSPITAL_COMMUNITY)
Admission: RE | Admit: 2014-08-18 | Discharge: 2014-08-18 | Disposition: A | Discharge status: Home / Self Care | Payer: Medicare Other | Source: Ambulatory Visit | Attending: General Surgery | Admitting: General Surgery

## 2014-08-18 DIAGNOSIS — E785 Hyperlipidemia, unspecified: Secondary | ICD-10-CM | POA: Diagnosis not present

## 2014-08-18 DIAGNOSIS — N6012 Diffuse cystic mastopathy of left breast: Secondary | ICD-10-CM | POA: Diagnosis not present

## 2014-08-18 DIAGNOSIS — Z8249 Family history of ischemic heart disease and other diseases of the circulatory system: Secondary | ICD-10-CM | POA: Diagnosis not present

## 2014-08-18 DIAGNOSIS — I10 Essential (primary) hypertension: Secondary | ICD-10-CM | POA: Diagnosis not present

## 2014-08-18 DIAGNOSIS — I2510 Atherosclerotic heart disease of native coronary artery without angina pectoris: Secondary | ICD-10-CM | POA: Insufficient documentation

## 2014-08-18 DIAGNOSIS — C50212 Malignant neoplasm of upper-inner quadrant of left female breast: Secondary | ICD-10-CM | POA: Diagnosis not present

## 2014-08-18 DIAGNOSIS — I208 Other forms of angina pectoris: Secondary | ICD-10-CM | POA: Insufficient documentation

## 2014-08-18 DIAGNOSIS — Z809 Family history of malignant neoplasm, unspecified: Secondary | ICD-10-CM | POA: Insufficient documentation

## 2014-08-18 DIAGNOSIS — M1990 Unspecified osteoarthritis, unspecified site: Secondary | ICD-10-CM | POA: Diagnosis not present

## 2014-08-18 DIAGNOSIS — K219 Gastro-esophageal reflux disease without esophagitis: Secondary | ICD-10-CM | POA: Diagnosis not present

## 2014-08-18 DIAGNOSIS — C50912 Malignant neoplasm of unspecified site of left female breast: Secondary | ICD-10-CM | POA: Diagnosis not present

## 2014-08-18 HISTORY — PX: RE-EXCISION OF BREAST LUMPECTOMY: SHX6048

## 2014-08-18 SURGERY — EXCISION, LESION, BREAST
Anesthesia: General | Laterality: Left

## 2014-08-18 MED ORDER — PROPOFOL 10 MG/ML IV BOLUS
INTRAVENOUS | Status: AC
  Filled 2014-08-18: qty 20

## 2014-08-18 MED ORDER — LIDOCAINE HCL (CARDIAC) 10 MG/ML IV SOLN
INTRAVENOUS | Status: DC | PRN
  Administered 2014-08-18: 100 mg via INTRAVENOUS

## 2014-08-18 MED ORDER — SCOPOLAMINE 1 MG/3DAYS TD PT72SCOPOLAMINE 1 MG/3DAYS
MEDICATED_PATCH | TRANSDERMAL | Status: DC | PRN
Start: 2014-08-18 — End: 2014-08-18
  Administered 2014-08-18: 1 via TRANSDERMAL

## 2014-08-18 MED ORDER — CHLORHEXIDINE GLUCONATE 4 % EX LIQD: 1.0000 "application " | Freq: Once | CUTANEOUS | Status: DC

## 2014-08-18 MED ORDER — MIDAZOLAM HCL 5 MG/5ML IJ SOLN
INTRAMUSCULAR | Status: DC | PRN
  Administered 2014-08-18 (×2): 1 mg via INTRAVENOUS

## 2014-08-18 MED ORDER — BUPIVACAINE-EPINEPHRINE (PF) 0.25% -1:200000 IJ SOLN
INTRAMUSCULAR | Status: DC | PRN
  Administered 2014-08-18: 29 mL via PERINEURAL

## 2014-08-18 MED ORDER — CEFAZOLIN SODIUM-DEXTROSE 2-3 GM-% IV SOLR
INTRAVENOUS | Status: AC
  Filled 2014-08-18: qty 50

## 2014-08-18 MED ORDER — OXYCODONE HCL 5 MG/5ML PO SOLN
5.0000 mg | Freq: Once | ORAL | Status: DC | PRN
  Filled 2014-08-18: qty 5

## 2014-08-18 MED ORDER — SCOPOLAMINE 1 MG/3DAYS TD PT72
MEDICATED_PATCH | TRANSDERMAL | Status: AC
  Filled 2014-08-18: qty 1

## 2014-08-18 MED ORDER — PROPOFOL 10 MG/ML IV BOLUS
INTRAVENOUS | Status: DC | PRN
  Administered 2014-08-18: 120 mg via INTRAVENOUS
  Administered 2014-08-18: 30 mg via INTRAVENOUS

## 2014-08-18 MED ORDER — PROMETHAZINE HCL 25 MG/ML IJ SOLN
INTRAMUSCULAR | Status: AC
  Filled 2014-08-18: qty 1

## 2014-08-18 MED ORDER — HYDROMORPHONE HCL 1 MG/ML IJ SOLN: 0.2500 mg | INTRAMUSCULAR | Status: DC | PRN

## 2014-08-18 MED ORDER — PHENYLEPHRINE HCL 10 MG/ML IJ SOLN
INTRAMUSCULAR | Status: DC | PRN
  Administered 2014-08-18: 80 ug via INTRAVENOUS

## 2014-08-18 MED ORDER — CEFAZOLIN SODIUM-DEXTROSE 2-3 GM-% IV SOLR
2.0000 g | INTRAVENOUS | Status: AC
  Administered 2014-08-18: 2 g via INTRAVENOUS

## 2014-08-18 MED ORDER — MEPERIDINE HCL 50 MG/ML IJ SOLN: 6.2500 mg | INTRAMUSCULAR | Status: DC | PRN

## 2014-08-18 MED ORDER — LACTATED RINGERS IV SOLN
INTRAVENOUS | Status: DC
  Administered 2014-08-18: 1000 mL via INTRAVENOUS

## 2014-08-18 MED ORDER — FENTANYL CITRATE 0.05 MG/ML IJ SOLN
INTRAMUSCULAR | Status: DC | PRN
  Administered 2014-08-18: 50 ug via INTRAVENOUS

## 2014-08-18 MED ORDER — OXYCODONE HCL 5 MG PO TABS: 5.0000 mg | ORAL_TABLET | Freq: Once | ORAL | Status: DC | PRN

## 2014-08-18 MED ORDER — ONDANSETRON HCL 4 MG/2ML IJ SOLN
INTRAMUSCULAR | Status: AC
  Filled 2014-08-18: qty 2

## 2014-08-18 MED ORDER — PROMETHAZINE HCL 25 MG/ML IJ SOLN: 6.2500 mg | INTRAMUSCULAR | Status: DC | PRN

## 2014-08-18 MED ORDER — DEXAMETHASONE SODIUM PHOSPHATE 10 MG/ML IJ SOLN
INTRAMUSCULAR | Status: AC
  Filled 2014-08-18: qty 1

## 2014-08-18 MED ORDER — MIDAZOLAM HCL 2 MG/2ML IJ SOLN
INTRAMUSCULAR | Status: AC
  Filled 2014-08-18: qty 2

## 2014-08-18 MED ORDER — LIDOCAINE HCL (CARDIAC) 20 MG/ML IV SOLN
INTRAVENOUS | Status: AC
  Filled 2014-08-18: qty 5

## 2014-08-18 MED ORDER — METOCLOPRAMIDE HCL 5 MG/ML IJ SOLN
INTRAMUSCULAR | Status: DC | PRN
  Administered 2014-08-18: 10 mg via INTRAVENOUS

## 2014-08-18 MED ORDER — PROPOFOL INFUSION 10 MG/ML OPTIME
INTRAVENOUS | Status: DC | PRN
  Administered 2014-08-18: 50 ug/kg/min via INTRAVENOUS

## 2014-08-18 MED ORDER — FENTANYL CITRATE 0.05 MG/ML IJ SOLN
INTRAMUSCULAR | Status: AC
  Filled 2014-08-18: qty 5

## 2014-08-18 MED ORDER — BUPIVACAINE-EPINEPHRINE 0.25% -1:200000 IJ SOLN
INTRAMUSCULAR | Status: AC
  Filled 2014-08-18: qty 1

## 2014-08-18 MED ORDER — ONDANSETRON HCL 4 MG/2ML IJ SOLN
INTRAMUSCULAR | Status: DC | PRN
  Administered 2014-08-18: 4 mg via INTRAVENOUS

## 2014-08-18 MED ORDER — DEXAMETHASONE SODIUM PHOSPHATE 10 MG/ML IJ SOLN
INTRAMUSCULAR | Status: DC | PRN
  Administered 2014-08-18: 10 mg via INTRAVENOUS

## 2014-08-18 SURGICAL SUPPLY — 34 items
BLADE SURG 15 STRL LF DISP TIS (BLADE) ×2 IMPLANT
BLADE SURG 15 STRL SS (BLADE) ×2
BLADE SURG SZ10 CARB STEEL (BLADE) ×2 IMPLANT
CANISTER SUCT 3000ML (MISCELLANEOUS) ×2 IMPLANT
CHLORAPREP W/TINT 10.5 ML (MISCELLANEOUS) ×2 IMPLANT
CLIP TI WIDE RED SMALL 6 (CLIP) ×4 IMPLANT
DECANTER SPIKE VIAL GLASS SM (MISCELLANEOUS) ×2 IMPLANT
DERMABOND ADVANCED (GAUZE/BANDAGES/DRESSINGS) ×1
DERMABOND ADVANCED .7 DNX12 (GAUZE/BANDAGES/DRESSINGS) ×1 IMPLANT
DRAPE LAPAROTOMY TRNSV 102X78 (DRAPE) ×2 IMPLANT
ELECT COATED BLADE 2.86 ST (ELECTRODE) ×2 IMPLANT
ELECT REM PT RETURN 9FT ADLT (ELECTROSURGICAL) ×2
ELECTRODE REM PT RTRN 9FT ADLT (ELECTROSURGICAL) ×1 IMPLANT
GAUZE SPONGE 4X4 12PLY STRL (GAUZE/BANDAGES/DRESSINGS) ×2 IMPLANT
GAUZE SPONGE 4X4 16PLY XRAY LF (GAUZE/BANDAGES/DRESSINGS) ×2 IMPLANT
GLOVE BIOGEL PI IND STRL 7.5 (GLOVE) ×1 IMPLANT
GLOVE BIOGEL PI INDICATOR 7.5 (GLOVE) ×1
GLOVE SS BIOGEL STRL SZ 7.5 (GLOVE) ×1 IMPLANT
GLOVE SUPERSENSE BIOGEL SZ 7.5 (GLOVE) ×1
GOWN STRL REUS W/TWL LRG LVL3 (GOWN DISPOSABLE) ×2 IMPLANT
GOWN STRL REUS W/TWL XL LVL3 (GOWN DISPOSABLE) ×4 IMPLANT
KIT BASIN OR (CUSTOM PROCEDURE TRAY) ×2 IMPLANT
LIQUID BAND (GAUZE/BANDAGES/DRESSINGS) IMPLANT
NEEDLE HYPO 22GX1.5 SAFETY (NEEDLE) ×2 IMPLANT
NEEDLE HYPO 25X1 1.5 SAFETY (NEEDLE) ×2 IMPLANT
NS IRRIG 1000ML POUR BTL (IV SOLUTION) ×2 IMPLANT
PACK BASIC VI WITH GOWN DISP (CUSTOM PROCEDURE TRAY) ×2 IMPLANT
PENCIL BUTTON HOLSTER BLD 10FT (ELECTRODE) ×2 IMPLANT
SPONGE LAP 4X18 X RAY DECT (DISPOSABLE) ×2 IMPLANT
SUT MNCRL AB 4-0 PS2 18 (SUTURE) ×2 IMPLANT
SUT VIC AB 3-0 SH 18 (SUTURE) ×2 IMPLANT
SYR CONTROL 10ML LL (SYRINGE) ×2 IMPLANT
TOWEL OR 17X26 10 PK STRL BLUE (TOWEL DISPOSABLE) ×2 IMPLANT
YANKAUER SUCT BULB TIP 10FT TU (MISCELLANEOUS) ×2 IMPLANT

## 2014-08-18 NOTE — Anesthesia Preprocedure Evaluation (Signed)
Anesthesia Evaluation  Patient identified by MRN, date of birth, ID band Patient awake    Reviewed: Allergy & Precautions, H&P , NPO status , Patient's Chart, lab work & pertinent test results  History of Anesthesia Complications (+) PONV and history of anesthetic complications  Airway Mallampati: I  TM Distance: >3 FB Neck ROM: Full    Dental   Pulmonary neg pulmonary ROS,          Cardiovascular hypertension, Pt. on medications + angina + CAD and + Cardiac Stents negative cardio ROS      Neuro/Psych negative neurological ROS  negative psych ROS   GI/Hepatic Neg liver ROS, GERD-  Controlled and Medicated,  Endo/Other  negative endocrine ROS  Renal/GU negative Renal ROS     Musculoskeletal  (+) Arthritis -,   Abdominal   Peds  Hematology negative hematology ROS (+)   Anesthesia Other Findings   Reproductive/Obstetrics negative OB ROS                             Anesthesia Physical  Anesthesia Plan  ASA: III  Anesthesia Plan: General   Post-op Pain Management:    Induction: Intravenous  Airway Management Planned: LMA  Additional Equipment:   Intra-op Plan:   Post-operative Plan: Extubation in OR  Informed Consent: I have reviewed the patients History and Physical, chart, labs and discussed the procedure including the risks, benefits and alternatives for the proposed anesthesia with the patient or authorized representative who has indicated his/her understanding and acceptance.   Dental advisory given  Plan Discussed with: CRNA  Anesthesia Plan Comments:         Anesthesia Quick Evaluation

## 2014-08-18 NOTE — Transfer of Care (Signed)
Immediate Anesthesia Transfer of Care Note  Patient: Jasmin Holloway  Procedure(s) Performed: Procedure(s): RE-EXCISION OF LEFT BREAST LUMPECTOMY (Left)  Patient Location: PACU  Anesthesia Type:General  Level of Consciousness: awake, alert , oriented and patient cooperative  Airway & Oxygen Therapy: Patient Spontanous Breathing and Patient connected to face mask oxygen  Post-op Assessment: Report given to PACU RN, Post -op Vital signs reviewed and stable and Patient moving all extremities X 4  Post vital signs: stable  Complications: No apparent anesthesia complications

## 2014-08-18 NOTE — Interval H&P Note (Signed)
History and Physical Interval Note:  08/18/2014 9:49 AM  Jasmin Holloway  has presented today for surgery, with the diagnosis of cancer left breast  The various methods of treatment have been discussed with the patient and family. After consideration of risks, benefits and other options for treatment, the patient has consented to  Procedure(s): RE-EXCISION OF LEFT BREAST LUMPECTOMY (Left) as a surgical intervention .  The patient's history has been reviewed, patient examined, no change in status, stable for surgery.  I have reviewed the patient's chart and labs.  Questions were answered to the patient's satisfaction.     Tracy Gerken T

## 2014-08-18 NOTE — Discharge Instructions (Signed)
[facility] °Office Phone Number [phone] ° °BREAST BIOPSY/ PARTIAL MASTECTOMY: POST OP INSTRUCTIONS ° °Always review your discharge instruction sheet given to you by the facility where your surgery was performed. ° °IF YOU HAVE DISABILITY OR FAMILY LEAVE FORMS, YOU MUST BRING THEM TO THE OFFICE FOR PROCESSING.  DO NOT GIVE THEM TO YOUR DOCTOR. ° °1. A prescription for pain medication may be given to you upon discharge.  Take your pain medication as prescribed, if needed.  If narcotic pain medicine is not needed, then you may take acetaminophen (Tylenol) or ibuprofen (Advil) as needed. °2. Take your usually prescribed medications unless otherwise directed °3. If you need a refill on your pain medication, please contact your pharmacy.  They will contact our office to request authorization.  Prescriptions will not be filled after 5pm or on week-ends. °4. You should eat very light the first 24 hours after surgery, such as soup, crackers, pudding, etc.  Resume your normal diet the day after surgery. °5. Most patients will experience some swelling and bruising in the breast.  Ice packs and a good support bra will help.  Swelling and bruising can take several days to resolve.  °6. It is common to experience some constipation if taking pain medication after surgery.  Increasing fluid intake and taking a stool softener will usually help or prevent this problem from occurring.  A mild laxative (Milk of Magnesia or Miralax) should be taken according to package directions if there are no bowel movements after 48 hours. °7. Unless discharge instructions indicate otherwise, you may remove your bandages 24-48 hours after surgery, and you may shower at that time.  You may have steri-strips (small skin tapes) in place directly over the incision.  These strips should be left on the skin for 7-10 days.  If your surgeon used skin glue on the incision, you may shower in 24 hours.  The glue will flake off over the  next 2-3 weeks.  Any sutures or staples will be removed at the office during your follow-up visit. °8. ACTIVITIES:  You may resume regular daily activities (gradually increasing) beginning the next day.  Wearing a good support bra or sports bra minimizes pain and swelling.  You may have sexual intercourse when it is comfortable. °a. You may drive when you no longer are taking prescription pain medication, you can comfortably wear a seatbelt, and you can safely maneuver your car and apply brakes. °b. RETURN TO WORK:  ______________________________________________________________________________________ °9. You should see your doctor in the office for a follow-up appointment approximately two weeks after your surgery.  Your doctor’s nurse will typically make your follow-up appointment when she calls you with your pathology report.  Expect your pathology report 2-3 business days after your surgery.  You may call to check if you do not hear from us after three days. °10. OTHER INSTRUCTIONS: _______________________________________________________________________________________________ _____________________________________________________________________________________________________________________________________ °_____________________________________________________________________________________________________________________________________ °_____________________________________________________________________________________________________________________________________ ° °WHEN TO CALL YOUR DOCTOR: °1. Fever over 101.0 °2. Nausea and/or vomiting. °3. Extreme swelling or bruising. °4. Continued bleeding from incision. °5. Increased pain, redness, or drainage from the incision. ° °The clinic staff is available to answer your questions during regular business hours.  Please don’t hesitate to call and ask to speak to one of the nurses for clinical concerns.  If you have a medical emergency, go to the nearest  emergency room or call 911.  A surgeon from [facility] is always on call at the hospital. ° °For further questions, please visit [email or web address]  °

## 2014-08-18 NOTE — Op Note (Signed)
Preoperative Diagnosis: cancer left breast  Postoprative Diagnosis: cancer left breast  Procedure: Procedure(s): RE-EXCISION OF LEFT BREAST LUMPECTOMY Inferior margin   Surgeon: Excell Seltzer T   Assistants: None  Anesthesia:  General LMA anesthesia  Indications: Patient is recently status post left breast lumpectomy for invasive and in situ cancer of the left breast. She had 1 focally positive margin for DCIS at the inferior margin. After discussion with the patient we have elected to proceed with reexcision of this margin. We discussed the indications for the surgery and risks of anesthetic complications, bleeding, infection or possible need for further surgery and she is in agreement    Procedure Detail:  Patient was brought to the operating room, placed in the supine position on the operating table, and general LMA anesthesia induced. The left breast was widely sterilely prepped and draped. She received preoperative antibiotics. PAS rate and place. Patient timeout was performed and correct procedure verified. The previous curvilinear incision circumareolar in the upper breast was sharply opened and dissection was carried down and sutures were removed and the previous lumpectomy cavity completely widely opened and exposed. I then reexcised entirely the inferior margin at least an additional 0.5 cm. This was oriented and sent for permanent pathology. The soft tissue was infiltrated with Marcaine and irrigated and complete hemostasis obtained. The deep and subcutaneous tissue was closed with interrupted 3-0 Vicryl and the skin with subcuticular 4-0 Monocryl and Dermabond. Sponge and needle and instrument counts were correct.          Specimens: Reexcision left breast lumpectomy, inferior margin, oriented with suture        Complications:  * No complications entered in OR log *         Disposition: PACU - hemodynamically stable.         Condition: stable

## 2014-08-18 NOTE — H&P (Signed)
History of Present Illness Marland Kitchen T. Aneesa Romey MD; 07/16/2014 2:36 PM) The patient is a 80 year old female who presents with breast cancer. Patint is a 80 YO post menopausal female referred by Dr. Nolon Nations for evaluation of recently diagnosed carcinoma of the left breast. a screening mammogram 6 months previously revealed a small area of distortion on one view and 6 month follow-up was recommended. Recenton she presented for this and the area of distortion persisted seen only on craniocaudal view. MRI was recommended for further evaluation which revealed an 8 mm mass. Subsequent MR guided biopsy was performed showing invasive ductal carcinoma, low-grade. Patient underwent a radioactive seed localized left breast lumpectomy approximately 2 weeks ago with pathology showing:   1. Breast, lumpectomy, left - INVASIVE DUCTAL CARCINOMA, GRADE II/III, SPANNING 0.4 CM. - DUCTAL CARCINOMA IN SITU WITH CALCIFICATIONS, INTERMEDIATE GRADE. - DUCTAL CARCINOMA IN SITU IS FOCALLY PRESENT AT THE INFERIOR MARGIN OF SPECIMEN #1.  I have recommended reexcision of the inferior margin to obtain a clear margin. She is in agreement and presents for this procedure.   Other Problems Marlowe Kays Goettsch; 07/16/2014 7:44 AM) Anxiety Disorder Arthritis Gastroesophageal Reflux Disease Hemorrhoids High blood pressure Hypercholesterolemia Lump In Breast Melanoma Migraine Headache Vascular Disease  Past Surgical History Marlowe Kays Goettsch; 07/16/2014 7:44 AM) Appendectomy Breast Biopsy Left. Cataract Surgery Bilateral. Colon Polyp Removal - Colonoscopy Oral Surgery Tonsillectomy  Diagnostic Studies History Mirian Mo; 07/16/2014 7:44 AM) Colonoscopy 1-5 years ago Mammogram within last year Pap Smear 1-5 years ago  Social History Marlowe Kays Goettsch; 07/16/2014 7:44 AM) Alcohol use Moderate alcohol use. Caffeine use Carbonated beverages. No drug use Tobacco use Never  smoker.  Family History Mirian Mo; 07/16/2014 7:44 AM) Arthritis Father, Sister. Colon Polyps Mother. Diabetes Mellitus Father. Heart Disease Father. Migraine Headache Mother. Respiratory Condition Mother.  Pregnancy / Birth History Mirian Mo; 07/16/2014 7:44 AM) Age at menarche 41 years. Age of menopause 87-55 Gravida 3 Irregular periods Maternal age 47-30 Para 2  Review of Systems Mirian Mo; 07/16/2014 7:44 AM) General Present- Night Sweats. Not Present- Appetite Loss, Chills, Fatigue, Fever, Weight Gain and Weight Loss. HEENT Present- Wears glasses/contact lenses. Not Present- Earache, Hearing Loss, Hoarseness, Nose Bleed, Oral Ulcers, Ringing in the Ears, Seasonal Allergies, Sinus Pain, Sore Throat, Visual Disturbances and Yellow Eyes. Respiratory Present- Snoring. Not Present- Bloody sputum, Chronic Cough, Difficulty Breathing and Wheezing. Breast Present- Breast Mass. Not Present- Breast Pain, Nipple Discharge and Skin Changes. Cardiovascular Not Present- Chest Pain, Difficulty Breathing Lying Down, Leg Cramps, Palpitations, Rapid Heart Rate, Shortness of Breath and Swelling of Extremities. Gastrointestinal Present- Constipation and Hemorrhoids. Not Present- Abdominal Pain, Bloating, Bloody Stool, Change in Bowel Habits, Chronic diarrhea, Difficulty Swallowing, Excessive gas, Gets full quickly at meals, Indigestion, Nausea, Rectal Pain and Vomiting. Musculoskeletal Not Present- Back Pain, Joint Pain, Joint Stiffness, Muscle Pain, Muscle Weakness and Swelling of Extremities. Neurological Present- Decreased Memory. Not Present- Fainting, Headaches, Numbness, Seizures, Tingling, Tremor, Trouble walking and Weakness. Psychiatric Present- Anxiety and Frequent crying. Not Present- Bipolar, Change in Sleep Pattern, Depression and Fearful. Endocrine Present- Heat Intolerance. Not Present- Cold Intolerance, Excessive Hunger, Hair Changes, Hot flashes and New  Diabetes. Hematology Not Present- Easy Bruising, Excessive bleeding, Gland problems, HIV and Persistent Infections.   Physical Exam Marland Kitchen T. Tyion Boylen MD; 07/16/2014 2:38 PM) The physical exam findings are as follows: Note:General: Alert, well-developed and well nourished older Caucasian female, in no distress Skin: Warm and dry without rash or infection. HEENT: No palpable masses or thyromegaly. Sclera nonicteric.  Pupils equal round and reactive. Oropharynx clear. breasts: Bruising and thickening in the upper inner left breast consistent with bihpsy. No other palpable abnormalities. No skin changes or nipple inversion. Lymph nodes: No cervical, supraclavicular, or inguinal nodes palpable. Lungs: Breath sounds clear and equal. No wheezing or increased work of breathing. Cardiovascular: Regular rate and rhythm without murmer. No JVD or edema. Peripheral pulses intact. No carotid bruits. Abdomen: Nondistended. Soft and nontender. No masses palpable. No organomegaly. No palpable hernias. Extremities: No edema or joint swelling or deformity. No chronic venous stasis changes. Neurologic: Alert and fully oriented. Gait normal. No focal weakness. Psychiatric: Normal mood and affect. Thought content appropriate with normal judgement and insight    Assessment & Plan Marland Kitchen T. Kalifa Cadden MD; 07/16/2014 2:42 PM) BREAST CANCER, LEFT (174.9  C50.912) Impression: 80 year old female with a new diagnosis of cancer of the left breast, upper inner quadrant. Clinical stage 1a, ER+, PR+, HER-2-.Focally positive inferior margin for DCIS post lumpectomy  Current Plans: Re excision inferior margin lumpectomy

## 2014-08-18 NOTE — Anesthesia Postprocedure Evaluation (Signed)
Anesthesia Post Note  Patient: Jasmin Holloway  Procedure(s) Performed: Procedure(s) (LRB): RE-EXCISION OF LEFT BREAST LUMPECTOMY (Left)  Anesthesia type: General  Patient location: PACU  Post pain: Pain level controlled  Post assessment: Post-op Vital signs reviewed  Last Vitals: BP 115/59 mmHg  Pulse 67  Temp(Src) 36.4 C (Oral)  Resp 16  Ht 5\' 4"  (1.626 m)  Wt 148 lb (67.132 kg)  BMI 25.39 kg/m2  SpO2 97%  Post vital signs: Reviewed  Level of consciousness: sedated  Complications: No apparent anesthesia complications

## 2014-08-19 ENCOUNTER — Encounter (HOSPITAL_COMMUNITY): Payer: Self-pay | Admitting: General Surgery

## 2014-08-20 ENCOUNTER — Telehealth (INDEPENDENT_AMBULATORY_CARE_PROVIDER_SITE_OTHER): Payer: Self-pay | Admitting: General Surgery

## 2014-08-20 NOTE — Telephone Encounter (Signed)
Call the patient regarding pathology, left message

## 2014-08-21 ENCOUNTER — Ambulatory Visit: Payer: Medicare Other | Admitting: Hematology and Oncology

## 2014-08-22 ENCOUNTER — Ambulatory Visit (HOSPITAL_BASED_OUTPATIENT_CLINIC_OR_DEPARTMENT_OTHER): Payer: Medicare Other | Admitting: Hematology and Oncology

## 2014-08-22 ENCOUNTER — Telehealth: Payer: Self-pay | Admitting: Hematology and Oncology

## 2014-08-22 DIAGNOSIS — Z170 Estrogen receptor positive status [ER+]: Secondary | ICD-10-CM

## 2014-08-22 DIAGNOSIS — C50212 Malignant neoplasm of upper-inner quadrant of left female breast: Secondary | ICD-10-CM

## 2014-08-22 MED ORDER — TAMOXIFEN CITRATE 20 MG PO TABS: 20.0000 mg | ORAL_TABLET | Freq: Every day | ORAL | Status: DC

## 2014-08-22 NOTE — Assessment & Plan Note (Signed)
Left breast invasive ductal carcinoma status post lumpectomy 08/05/2014 0.4 cm tumor, T1 aN0 M0 stage IA ER 90% PR 90% HER-2 negative ratio 1.34, Ki-67 10%  Pathology counseling: I discussed the final pathology report with the patient and provided him with a copy of the report. Recommendation: Antiestrogen therapy with tamoxifen 20 mg daily. The reason for choosing tamoxifen is her history of osteoporosis.  Tamoxifen counseling:We discussed the risks and benefits of tamoxifen. These include but not limited to insomnia, hot flashes, mood changes, vaginal dryness, and weight gain. Although rare, serious side effects including endometrial cancer, risk of blood clots were also discussed. We strongly believe that the benefits far outweigh the risks. Patient understands these risks and consented to starting treatment. Planned treatment duration is 5 years.  Return to clinic in 3 months for follow-up

## 2014-08-22 NOTE — Telephone Encounter (Signed)
per pof to sch pt appt-gave pt copy of sch °

## 2014-08-22 NOTE — Addendum Note (Signed)
Addended by: Prentiss Bells on: 08/22/2014 08:28 PM   Modules accepted: Medications

## 2014-08-22 NOTE — Progress Notes (Signed)
Patient Care Team: Sheela Stack, MD as PCP - General (Endocrinology) Rulon Eisenmenger, MD as Consulting Physician (Hematology and Oncology) Excell Seltzer, MD as Consulting Physician (General Surgery) Eppie Gibson, MD as Attending Physician (Radiation Oncology) Trinda Pascal, NP as Nurse Practitioner (Nurse Practitioner)  DIAGNOSIS: Breast cancer of upper-inner quadrant of left female breast   Staging form: Breast, AJCC 7th Edition     Clinical stage from 07/16/2014: Stage IA (T1b, N0, M0) - Unsigned       Staging comments: Staged at breast conference on 11.11.15      Pathologic stage from 08/05/2014: Stage Unknown (T1a, NX, cM0) - Signed by Rulon Eisenmenger, MD on 08/14/2014       Staging comments: Staging from final excisional pathology by Dr. Lyndon Code.    SUMMARY OF ONCOLOGIC HISTORY:   Breast cancer of upper-inner quadrant of left female breast   07/01/2014 Breast MRI Left breast 10:00 position 0.8 x 0.7 x 0.6 cm lobulated enhancing mass, liver cysts   07/07/2014 Initial Diagnosis Left breast invasive ductal carcinoma with DCIS with calcifications, grade 2 with micropapillary features ER 90%, PR 90%, HER-2 negative ratio 1.34, Ki-67 10%   08/05/2014 Surgery Left breast lumpectomy: 0.4 cm IDC, with DCIS, margins negative    CHIEF COMPLIANT: Follow-up after surgery  INTERVAL HISTORY: Jasmin Holloway is a 80 year old lady with above-mentioned history of left-sided breast cancer treated with lumpectomy on 08/05/2014. She is here today to follow-up on the final pathology report. She has recovered very well from the surgery without any major problems. She is accompanied by her husband to discuss the adjuvant treatment plan.  REVIEW OF SYSTEMS:   Constitutional: Denies fevers, chills or abnormal weight loss Eyes: Denies blurriness of vision Ears, nose, mouth, throat, and face: Denies mucositis or sore throat Respiratory: Denies cough, dyspnea or wheezes Cardiovascular:  Denies palpitation, chest discomfort or lower extremity swelling Gastrointestinal:  Denies nausea, heartburn or change in bowel habits Skin: Denies abnormal skin rashes Lymphatics: Denies new lymphadenopathy or easy bruising Neurological:Denies numbness, tingling or new weaknesses Behavioral/Psych: Mood is stable, no new changes  Breast:  denies any pain or lumps or nodules in either breasts All other systems were reviewed with the patient and are negative.  I have reviewed the past medical history, past surgical history, social history and family history with the patient and they are unchanged from previous note.  ALLERGIES:  is allergic to codeine.  MEDICATIONS:  Current Outpatient Prescriptions  Medication Sig Dispense Refill  . aspirin EC 81 MG tablet Take 81 mg by mouth daily.    Marland Kitchen atorvastatin (LIPITOR) 40 MG tablet Take 1 tablet (40 mg total) by mouth daily. (Patient taking differently: Take 40 mg by mouth at bedtime. ) 90 tablet 3  . benazepril-hydrochlorthiazide (LOTENSIN HCT) 10-12.5 MG per tablet Take 1 tablet by mouth  daily (Patient not taking: Reported on 08/13/2014) 90 tablet 2  . benazepril-hydrochlorthiazide (LOTENSIN HCT) 10-12.5 MG per tablet Take 1 tablet by mouth every morning.    . Biotin 5000 MCG CAPS Take 1 capsule by mouth daily.    . cholecalciferol (VITAMIN D) 1000 UNITS tablet Take 1,000 Units by mouth daily.    . diphenhydramine-acetaminophen (TYLENOL PM) 25-500 MG TABS Take 1 tablet by mouth at bedtime as needed.    Marland Kitchen EVENING PRIMROSE OIL PO Take 1 tablet by mouth daily.    Marland Kitchen HYDROcodone-acetaminophen (NORCO/VICODIN) 5-325 MG per tablet Take 1-2 tablets by mouth every 4 (four) hours as needed for  moderate pain or severe pain. 30 tablet 0  . Melatonin 3 MG CAPS Take 3 mg by mouth at bedtime as needed (Sleep).     . metoprolol tartrate (LOPRESSOR) 25 MG tablet Take 1 tablet by mouth two  times daily 180 tablet 2  . Misc Natural Products (OSTEO BI-FLEX JOINT  SHIELD) TABS Take 2 tablets by mouth daily.    . Multiple Minerals-Vitamins (CALCIUM CITRATE PLUS PO) Take 1 tablet by mouth 2 (two) times daily.    . Multiple Vitamin (MULTIVITAMIN WITH MINERALS) TABS tablet Take 1 tablet by mouth daily.    . Omega-3 Fatty Acids (FISH OIL) 1200 MG CPDR Take 1 tablet by mouth daily.    . pantoprazole (PROTONIX) 40 MG tablet Take 1 tablet (40 mg total) by mouth daily. (Patient taking differently: Take 40 mg by mouth at bedtime. ) 30 tablet 1  . tamoxifen (NOLVADEX) 20 MG tablet Take 1 tablet (20 mg total) by mouth daily. 90 tablet 3  . vitamin C (ASCORBIC ACID) 500 MG tablet Take 500 mg by mouth daily.     No current facility-administered medications for this visit.    PHYSICAL EXAMINATION: ECOG PERFORMANCE STATUS: 0 - Asymptomatic  Filed Vitals:   08/22/14 1409  BP: 115/63  Pulse: 71  Temp: 97.4 F (36.3 C)  Resp: 18   Filed Weights   08/22/14 1409  Weight: 149 lb 9.6 oz (67.858 kg)    GENERAL:alert, no distress and comfortable SKIN: skin color, texture, turgor are normal, no rashes or significant lesions EYES: normal, Conjunctiva are pink and non-injected, sclera clear OROPHARYNX:no exudate, no erythema and lips, buccal mucosa, and tongue normal  NECK: supple, thyroid normal size, non-tender, without nodularity LYMPH:  no palpable lymphadenopathy in the cervical, axillary or inguinal LUNGS: clear to auscultation and percussion with normal breathing effort HEART: regular rate & rhythm and no murmurs and no lower extremity edema ABDOMEN:abdomen soft, non-tender and normal bowel sounds Musculoskeletal:no cyanosis of digits and no clubbing  NEURO: alert & oriented x 3 with fluent speech, no focal motor/sensory deficits  LABORATORY DATA:  I have reviewed the data as listed   Chemistry      Component Value Date/Time   NA 138 08/14/2014 0925   NA 143 07/16/2014 1242   K 4.5 08/14/2014 0925   K 3.9 07/16/2014 1242   CL 101 08/14/2014 0925    CO2 28 08/14/2014 0925   CO2 27 07/16/2014 1242   BUN 17 08/14/2014 0925   BUN 16.2 07/16/2014 1242   CREATININE 0.81 08/14/2014 0925   CREATININE 0.9 07/16/2014 1242      Component Value Date/Time   CALCIUM 10.2 08/14/2014 0925   CALCIUM 10.5* 07/16/2014 1242   ALKPHOS 53 07/16/2014 1242   AST 30 07/16/2014 1242   ALT 30 07/16/2014 1242   BILITOT 0.69 07/16/2014 1242       Lab Results  Component Value Date   WBC 7.8 08/14/2014   HGB 13.3 08/14/2014   HCT 39.7 08/14/2014   MCV 92.3 08/14/2014   PLT 205 08/14/2014   NEUTROABS 5.5 07/16/2014    ASSESSMENT & PLAN:  Breast cancer of upper-inner quadrant of left female breast Left breast invasive ductal carcinoma status post lumpectomy 08/05/2014 0.4 cm tumor, T1 aN0 M0 stage IA ER 90% PR 90% HER-2 negative ratio 1.34, Ki-67 10%  Pathology counseling: I discussed the final pathology report with the patient and provided him with a copy of the report. Recommendation: Antiestrogen therapy with tamoxifen 20  mg daily. The reason for choosing tamoxifen is her history of osteoporosis. Patient had seen radiation oncology and they did not recommend adjuvant radiation therapy.  Tamoxifen counseling:We discussed the risks and benefits of tamoxifen. These include but not limited to insomnia, hot flashes, mood changes, vaginal dryness, and weight gain. Although rare, serious side effects including endometrial cancer, risk of blood clots were also discussed. We strongly believe that the benefits far outweigh the risks. Patient understands these risks and consented to starting treatment. Planned treatment duration is 5 years.  Return to clinic in 3 months for follow-up   No orders of the defined types were placed in this encounter.   The patient has a good understanding of the overall plan. she agrees with it. She will call with any problems that may develop before her next visit here.   Rulon Eisenmenger, MD 08/22/2014 2:49 PM

## 2014-08-25 ENCOUNTER — Telehealth: Payer: Self-pay | Admitting: *Deleted

## 2014-08-25 NOTE — Telephone Encounter (Signed)
Received bone density report from [facility]. Sent to scan.

## 2014-08-27 DIAGNOSIS — N390 Urinary tract infection, site not specified: Secondary | ICD-10-CM | POA: Diagnosis not present

## 2014-09-03 ENCOUNTER — Other Ambulatory Visit: Payer: Self-pay

## 2014-09-03 DIAGNOSIS — C50212 Malignant neoplasm of upper-inner quadrant of left female breast: Secondary | ICD-10-CM

## 2014-09-03 MED ORDER — TAMOXIFEN CITRATE 20 MG PO TABS: 20.0000 mg | ORAL_TABLET | Freq: Every day | ORAL | Status: DC

## 2014-09-03 NOTE — Progress Notes (Signed)
Returned pt call re: Tamoxifen refill not delivered by [facility].  Sent escrip to [facility] for 1 mo supply - receipt confirmed.  Let pt know [facility] was supposed to notify her prior to shipping to arrange for delivery/signature.  Pt voiced understanding.

## 2014-09-08 ENCOUNTER — Telehealth: Payer: Self-pay | Admitting: Adult Health

## 2014-09-08 ENCOUNTER — Other Ambulatory Visit: Payer: Self-pay | Admitting: Adult Health

## 2014-09-08 ENCOUNTER — Ambulatory Visit: Payer: Medicare Other | Admitting: Adult Health

## 2014-09-08 NOTE — Telephone Encounter (Signed)
Spoke with Jasmin Holloway to set-up her [facility] visit.  She was not at home at the time of the call and did not have her calendar with her to schedule the appointment.  She agreed to call me at [phone] to schedule her appointment at her earliest convenience.   Mike Craze, NP [place] [phone]

## 2014-09-08 NOTE — Telephone Encounter (Signed)
Ms. Pangborn returned my phone call to schedule [facility] appointment for 09/09/2014 at [place].  I scheduled patient appointment for this date/time.    Mike Craze, NP [place] [phone]

## 2014-09-09 ENCOUNTER — Encounter: Payer: Self-pay | Admitting: Adult Health

## 2014-09-09 ENCOUNTER — Ambulatory Visit (HOSPITAL_BASED_OUTPATIENT_CLINIC_OR_DEPARTMENT_OTHER): Payer: Medicare Other | Admitting: Adult Health

## 2014-09-09 VITALS — BP 131/66 | HR 64 | Temp 98.3°F | Resp 16

## 2014-09-09 DIAGNOSIS — M8580 Other specified disorders of bone density and structure, unspecified site: Secondary | ICD-10-CM

## 2014-09-09 DIAGNOSIS — C50212 Malignant neoplasm of upper-inner quadrant of left female breast: Secondary | ICD-10-CM

## 2014-09-09 NOTE — Progress Notes (Signed)
CLINIC:  Cancer Survivorship   REASON FOR VISIT:  Routine follow-up post-treatment for a recent history of breast cancer.  HISTORY OF PRESENT ILLNESS:  Jasmin Holloway is a very pleasant 81 y.o. female with a history of invasive ductal carcinoma of the left breast.  Her oncologic history dates back to October 2015 when she underwent a breast MRI revealing a 0.8 x 0.7 x 0.6 lobulated enhancing mass in the left breast.  Biopsy of this left breast mass revealed invasive ductal carcinoma which was ER positive, PR positive, and HER-2 negative, with a Ki-67 of 10%.  Upon her diagnosis of breast cancer, Jasmin Holloway presented to [facility] for treatment options discussion and recommendations.   She initially underwent left lumpectomy on 08/05/2014, which revealed positive surgical margins and Jasmin Holloway underwent re-excision on 08/18/2014.  Surgical margins were clear after the re-excision.  Per the recommendations of Dr. Lindi Adie, Jasmin Holloway initiated anti-estrogen therapy with Tamoxifen on 09/05/2014.  Her planned duration of anti-estrogen therapy is 5 years.   Jasmin Holloway presents to the Survivorship Clinic today for our initial meeting to review her survivorship care plan detailing her treatment course of breast cancer, as well as monitoring for long-term side effects of that treatment, education regarding health maintenance, screening, and overall wellness and health promotion.   Overall, Jasmin Holloway reports feeling quite well since completing her breast surgery.  She denies any post-surgical pain, edema, or erythema.  She reports that she is tolerating the Tamoxifen well thus far, but reports that she has only been taking it a few days because she had difficulty obtaining the medication from her pharmacy and started the drug on 09/05/2014.  She states that she and her husband have been really stressed during the month of December because they are  currently moving and trying to sell their home.  This has been a big stressor for her, but she reports that she is handling her post-cancer course quite well and states that she feels "very lucky that I only had to have surgery and Tamoxifen for my cancer."   REVIEW OF SYSTEMS:  General: Denies fever, chills, unintentional weight loss, or generalized fatigue.   Cardiac: Denies palpitations, chest pain, and lower extremity edema.  Respiratory: Denies cough, shortness of breath, and dyspnea on exertion.  GI: Denies abdominal pain,  diarrhea, nausea, or vomiting.  Reports occasional constipation, but this is improving with increased fiber consumption in her diet.   GU: Denies dysuria, hematuria, vaginal bleeding, vaginal discharge, or vaginal dryness.  Musculoskeletal: Endorses joint pain to her bilateral hands that she attributes to known arthritis. .   Breast: Denies any new nodularity, masses, tenderness, nipple changes, or nipple discharge.  Psych: Denies depression, anxiety. Endorses some short-term memory issues, but she attributes this to age and is grossly unchanged from before her cancer diagnosis.  Also reports occasional episodes of insomnia requiring PRN sleep medication (melatonin or Tylenol PM)   A 14-point review of systems was completed and was negative, except as noted above.  BRIEF ONCOLOGIC HISTORY:    Breast cancer of upper-inner quadrant of left female breast   07/01/2014 Breast MRI Left breast 10:00 position 0.8 x 0.7 x 0.6 cm lobulated enhancing mass, liver cysts   07/07/2014 Initial Diagnosis Left breast invasive ductal carcinoma with DCIS with calcifications, grade 2 with micropapillary features ER 90%, PR 90%, HER-2 negative ratio 1.34, Ki-67 10%   08/05/2014 Surgery Left breast lumpectomy: 0.4 cm IDC, with DCIS,  margins negative   09/05/2014 -  Anti-estrogen oral therapy Tamoxifen $RemoveBeforeDEI'20mg'SfoQrmIpsvJIglvO$  once daily for 5 years (will complete in 09/2019)     ONCOLOGY TREATMENT TEAM:  1.  Surgeon:  Dr. Saddie Benders at [facility] 2. Medical Oncologist: Dr. Lindi Adie     PAST MEDICAL/SURGICAL HISTORY:  Past Medical History  Diagnosis Date  . Hypertension   . Hyperlipidemia   . Mitral regurgitation     mild 03/29/2013 echo  . Aortic sclerosis     03/29/13 echo  . Breast cancer   . GERD (gastroesophageal reflux disease)   . Coronary artery disease     DR. BERRY IS PT'S CARDIOLOGIST  . Arthritis     HANDS  . PONV (postoperative nausea and vomiting)     PT HAD SEVERE NAUSEA WAKING UP IN RR AFTER BREAST LUMPECTOMY   Past Surgical History  Procedure Laterality Date  . Coronary angioplasty with stent placement  11/16/1998    stent to the LAD and PTCA side branch diagonal  . Nm myocar perf wall motion  07/13/2009  . Appendectomy    . Colonoscopy    . Dilation and curettage of uterus    . Tonsillectomy    . Eye surgery      both cataracts  . Breast lumpectomy with radioactive seed localization Left 08/05/2014    Procedure: SEED LOCALIZED LEFT BREAST LUMPECTOMY;  Surgeon: Excell Seltzer, MD;  Location: [facility];  Service: General;  Laterality: Left;  . Re-excision of breast lumpectomy Left 08/18/2014    Procedure: RE-EXCISION OF LEFT BREAST LUMPECTOMY;  Surgeon: Excell Seltzer, MD;  Location: [facility];  Service: General;  Laterality: Left;      HEALTH MAINTENANCE/WELLNESS HISTORY:  1. Last colonoscopy: 11/1999. [Note: Patient states that she thinks she has had a colonoscopy within the past 5 years but is unsure of the location or year of the most recent study; per her report, the recommendation was to have her next colonoscopy in 5 years and is not due in 2016]. 2. Flu vaccine received: Yes, 04/05/2014  4. Pneumococcal vaccine given: Yes, patient unsure of year. Our records show 07/07/2001.  5. Shingles vaccine given: Yes, patient unsure of year. Our records show 07/07/1996. 6. Last DEXA scan: 10/25/2002, results showed osteopenia with 3.2% decrease  in bone density in the lumbar spine.  [Note: Patient is unsure if she has had a DEXA scan since this date but reports that the results indicated osteopenia].    CURRENT MEDICATIONS:  Outpatient Encounter Prescriptions as of 09/09/2014  Medication Sig Note  . aspirin EC 81 MG tablet Take 81 mg by mouth daily. 08/13/2014: Stopped for procedure   . atorvastatin (LIPITOR) 40 MG tablet Take 1 tablet (40 mg total) by mouth daily. (Patient taking differently: Take 40 mg by mouth at bedtime. )   . benazepril-hydrochlorthiazide (LOTENSIN HCT) 10-12.5 MG per tablet Take 1 tablet by mouth  daily   . benazepril-hydrochlorthiazide (LOTENSIN HCT) 10-12.5 MG per tablet Take 1 tablet by mouth every morning.   . Biotin 5000 MCG CAPS Take 1 capsule by mouth daily.   . cholecalciferol (VITAMIN D) 1000 UNITS tablet Take 1,000 Units by mouth daily.   . diphenhydramine-acetaminophen (TYLENOL PM) 25-500 MG TABS Take 1 tablet by mouth at bedtime as needed.   Marland Kitchen EVENING PRIMROSE OIL PO Take 1 tablet by mouth daily.   . Melatonin 3 MG CAPS Take 3 mg by mouth at bedtime as needed (Sleep).    . metoprolol tartrate (LOPRESSOR)  25 MG tablet Take 1 tablet by mouth two  times daily   . Misc Natural Products (OSTEO BI-FLEX JOINT SHIELD) TABS Take 2 tablets by mouth daily.   . Multiple Minerals-Vitamins (CALCIUM CITRATE PLUS PO) Take 1 tablet by mouth 2 (two) times daily.   . Multiple Vitamin (MULTIVITAMIN WITH MINERALS) TABS tablet Take 1 tablet by mouth daily.   . Omega-3 Fatty Acids (FISH OIL) 1200 MG CPDR Take 1 tablet by mouth daily.   . pantoprazole (PROTONIX) 40 MG tablet Take 1 tablet (40 mg total) by mouth daily. (Patient taking differently: Take 40 mg by mouth at bedtime. )   . tamoxifen (NOLVADEX) 20 MG tablet Take 1 tablet (20 mg total) by mouth daily.   . vitamin C (ASCORBIC ACID) 500 MG tablet Take 500 mg by mouth daily.   . [DISCONTINUED] HYDROcodone-acetaminophen (NORCO/VICODIN) 5-325 MG per tablet Take 1-2 tablets  by mouth every 4 (four) hours as needed for moderate pain or severe pain.     ONCOLOGIC FAMILY HISTORY:  1. Mother: Metastatic, potentially lung cancer primary.     SOCIAL HISTORY:  TATUMN CORBRIDGE is married and lives [place] her [place]  Ms. Holloway is currently retired.  She denies any current or history of tobacco or illicit drug use.  She reports having about 3 glasses of wine per week.     PHYSICAL EXAMINATION:  Vital Signs:  Filed Vitals:   09/09/14 0900  BP: 131/66  Pulse: 64  Temp: 98.3 F (36.8 C)  Resp: 16  O2 Sat:               95%   General: Well-nourished, well-appearing female in no acute distress.  She is accompanied in clinic by her husband today.   HEENT: Head is atraumatic and normocephalic.  Pupils equal and reactive to light and accomodation. Conjunctivae clear without exudate.  Sclerae anicteric. Oral mucosa is pink, moist, and intact without lesions.  Oropharynx is pink without lesions or erythema.  Lymph: No cervical, supraclavicular or infraclavicular noted on palpation.  Cardiovascular: Irregular rhythm, regular rate, without murmurs, rubs, or gallops. Respiratory: Clear to auscultation bilaterally. Chest expansion symmetric without accessory muscle use on inspiration or expiration.  GI: Abdomen soft and round. No tenderness to palpation. Bowel sounds normoactive in 4 quadrants. No hepatosplenomegaly.   GU: Deferred.  Musculoskeletal: Muscle strength 4/5 and equal in bilateral hand grips. Otherwise muscle strength is 5/5 in all extremities. No tenderness to palpation to spine, scapula, or clavicles. Full ROM noted in all extremities.  Neuro: No focal deficits. Steady gait.  Psych: Mood and affect normal and appropriate for situation.  Extremities: No edema, cyanosis, or clubbing.  Skin: Warm and dry. No open lesions noted.   LABORATORY DATA:  None for this visit.   DIAGNOSTIC IMAGING:  None for this visit.     ASSESSMENT AND PLAN:   1. History of breast cancer:  Jasmin Holloway will follow-up with her medical oncologist, Dr. Pamelia Hoit in his clinic in March 2016 with  physical exam per surveillance protocol.  She will continue her endocrine therapy with Tamoxifen as prescribed by Dr. Pamelia Hoit, as well. She was instructed to make Dr. Pamelia Hoit or myself aware if she begins to experience any side effects of the medication and I could see her back in clinic to help manage those side effects, as needed. Though the incidence is low, there is an associated risk of endometrial cancer with anti-estrogen therapies like Tamoxifen. Other side  effects of Tamoxifen were again reviewed with her as well.  A comprehensive survivorship care plan and treatment summary was reviewed with the patient today detailing her breast cancer diagnosis, treatment course, potential late/long-term effects of treatment, appropriate follow-up care with recommendations for the future, and patient education resources.  A copy of this treatment summary  will be sent to the patient's primary care provider after today's visit. Jasmin Holloway was encouraged to contact Dr. Lindi Adie or myself with any vaginal bleeding while taking Tamoxifen.   Jasmin Holloway is welcome to return to the Survivorship Clinic in the future, as needed; no follow-up will be scheduled at this time.   2. Bone health:  Given Jasmin Holloway's age and history of breast cancer , she is at risk for bone demineralization.  Per our records, her last DEXA scan was 10/25/2002, but Jasmin Holloway thinks she may have had a scan with Dr. Baldwin Crown (PCP_ office since that time but is uncertain about the year.  She does indicate that the results showed osteopenia.  Tamoxifen has been shown to slightly improve bone mineralization and is the most appropriate choice for her anti-estrogen therapy. She was encouraged to continue taking her Vitamin D as prescribed and also increase her consumption of foods rich in  calcium, as well as increase weight-bearing activities.  She was given education on specific activities to promote bone health.  3. Cancer screening:  Due to Jasmin Holloway's history and her age, she should receive screening for skin cancers, colon cancer, and gynecologic cancers.  The information and recommendations are listed on the patient's comprehensive care plan/treatment summary and were reviewed in detail with the patient.    5. Health maintenance and wellness promotion: Jasmin Holloway was encouraged to consume 5-7 servings of fruits and vegetables per day. She was also encouraged to engage in moderate to vigorous exercise for 30 minutes per day most days of the week. She was instructed to limit her alcohol consumption and continue to abstain from tobacco use.  Lastly, her vaccinations are currently up-to-date.  Jasmin Holloway and her husband were encouraged to ask questions during the visit today and all questions were answered to their satisfaction.  She expressed verbal understanding of the plan and was encouraged to contact us at the [facility] for any needs that may arise before her next clinic visit.   A total of 45 minutes of face-to-face time was spent with this patient with greater than 50% of that time in counseling and care-coordination.   Mike Craze, NP Survivorship Program [place] [phone]    Note: PRIMARY CARE PROVIDER Sheela Stack, [place] [phone]

## 2014-09-20 DIAGNOSIS — S2231XA Fracture of one rib, right side, initial encounter for closed fracture: Secondary | ICD-10-CM | POA: Diagnosis not present

## 2014-09-22 DIAGNOSIS — Z6825 Body mass index (BMI) 25.0-25.9, adult: Secondary | ICD-10-CM | POA: Diagnosis not present

## 2014-09-22 DIAGNOSIS — S2231XA Fracture of one rib, right side, initial encounter for closed fracture: Secondary | ICD-10-CM | POA: Diagnosis not present

## 2014-09-22 DIAGNOSIS — W010XXA Fall on same level from slipping, tripping and stumbling without subsequent striking against object, initial encounter: Secondary | ICD-10-CM | POA: Diagnosis not present

## 2014-09-28 ENCOUNTER — Other Ambulatory Visit: Payer: Self-pay | Admitting: Hematology and Oncology

## 2014-09-28 DIAGNOSIS — C50212 Malignant neoplasm of upper-inner quadrant of left female breast: Secondary | ICD-10-CM

## 2014-10-10 DIAGNOSIS — M25511 Pain in right shoulder: Secondary | ICD-10-CM | POA: Diagnosis not present

## 2014-10-10 DIAGNOSIS — M7551 Bursitis of right shoulder: Secondary | ICD-10-CM | POA: Diagnosis not present

## 2014-10-10 DIAGNOSIS — S4992XA Unspecified injury of left shoulder and upper arm, initial encounter: Secondary | ICD-10-CM | POA: Diagnosis not present

## 2014-10-10 DIAGNOSIS — S4991XA Unspecified injury of right shoulder and upper arm, initial encounter: Secondary | ICD-10-CM | POA: Diagnosis not present

## 2014-11-18 ENCOUNTER — Ambulatory Visit: Payer: Medicare Other | Admitting: Cardiovascular Disease

## 2014-11-21 ENCOUNTER — Ambulatory Visit (HOSPITAL_BASED_OUTPATIENT_CLINIC_OR_DEPARTMENT_OTHER): Payer: Medicare Other | Admitting: Hematology and Oncology

## 2014-11-21 ENCOUNTER — Telehealth: Payer: Self-pay | Admitting: Hematology and Oncology

## 2014-11-21 VITALS — BP 131/65 | HR 67 | Temp 98.3°F | Resp 18 | Ht 64.0 in | Wt 150.2 lb

## 2014-11-21 DIAGNOSIS — C50212 Malignant neoplasm of upper-inner quadrant of left female breast: Secondary | ICD-10-CM

## 2014-11-21 DIAGNOSIS — Z79810 Long term (current) use of selective estrogen receptor modulators (SERMs): Secondary | ICD-10-CM | POA: Diagnosis not present

## 2014-11-21 DIAGNOSIS — Z853 Personal history of malignant neoplasm of breast: Secondary | ICD-10-CM | POA: Diagnosis not present

## 2014-11-21 NOTE — Telephone Encounter (Signed)
Appointments made and avs printed for patient °

## 2014-11-21 NOTE — Progress Notes (Signed)
Patient Care Team: Reynold Bowen, MD as PCP - General (Endocrinology) Nicholas Lose, MD as Consulting Physician (Hematology and Oncology) Excell Seltzer, MD as Consulting Physician (General Surgery) Eppie Gibson, MD as Attending Physician (Radiation Oncology) Holley Bouche, NP as Nurse Practitioner (Nurse Practitioner)  DIAGNOSIS: Breast cancer of upper-inner quadrant of left female breast   Staging form: Breast, AJCC 7th Edition     Clinical stage from 07/16/2014: Stage IA (T1b, N0, M0) - Unsigned       Staging comments: Staged at breast conference on 11.11.15      Pathologic stage from 08/05/2014: Stage Unknown (T1a, NX, cM0) - Signed by Rulon Eisenmenger, MD on 08/14/2014       Staging comments: Staging from final excisional pathology by Dr. Lyndon Code.    SUMMARY OF ONCOLOGIC HISTORY:   Breast cancer of upper-inner quadrant of left female breast   07/01/2014 Breast MRI Left breast 10:00 position 0.8 x 0.7 x 0.6 cm lobulated enhancing mass, liver cysts   07/07/2014 Initial Diagnosis Left breast invasive ductal carcinoma with DCIS with calcifications, grade 2 with micropapillary features ER 90%, PR 90%, HER-2 negative ratio 1.34, Ki-67 10%   08/05/2014 Surgery Left breast lumpectomy: 0.4 cm IDC, with DCIS, margins negative   09/05/2014 -  Anti-estrogen oral therapy Tamoxifen $RemoveBeforeDEI'20mg'[id number]$  once daily for 5 years (will complete in 09/2019)    CHIEF COMPLIANT: Follow-up of left breast cancer and tamoxifen  INTERVAL HISTORY: Jasmin Holloway is a 81 year old with above-mentioned history of left breast invasive ductal carcinoma with DCIS treated with lumpectomy and is now on tamoxifen for the past 3 months. She is tolerating tamoxifen extremely well without any problems or concerns. She does get occasional hot flash but it doesn't bother her. Her breast surgical scar is healed very well.  REVIEW OF SYSTEMS:   Constitutional: Denies fevers, chills or abnormal weight loss Eyes: Denies blurriness of  vision Ears, nose, mouth, throat, and face: Denies mucositis or sore throat Respiratory: Denies cough, dyspnea or wheezes Cardiovascular: Denies palpitation, chest discomfort or lower extremity swelling Gastrointestinal:  Denies nausea, heartburn or change in bowel habits Skin: Denies abnormal skin rashes Lymphatics: Denies new lymphadenopathy or easy bruising Neurological:Denies numbness, tingling or new weaknesses Behavioral/Psych: Mood is stable, no new changes  Breast:  denies any pain or lumps or nodules in either breasts All other systems were reviewed with the patient and are negative.  I have reviewed the past medical history, past surgical history, social history and family history with the patient and they are unchanged from previous note.  ALLERGIES:  is allergic to codeine.  MEDICATIONS:  Current Outpatient Prescriptions  Medication Sig Dispense Refill  . aspirin EC 81 MG tablet Take 81 mg by mouth daily.    Marland Kitchen atorvastatin (LIPITOR) 40 MG tablet Take 1 tablet (40 mg total) by mouth daily. (Patient taking differently: Take 40 mg by mouth at bedtime. ) 90 tablet 3  . benazepril-hydrochlorthiazide (LOTENSIN HCT) 10-12.5 MG per tablet Take 1 tablet by mouth every morning.    . Biotin 5000 MCG CAPS Take 1 capsule by mouth daily.    . cholecalciferol (VITAMIN D) 1000 UNITS tablet Take 1,000 Units by mouth daily.    . diphenhydramine-acetaminophen (TYLENOL PM) 25-500 MG TABS Take 1 tablet by mouth at bedtime as needed.    Marland Kitchen EVENING PRIMROSE OIL PO Take 1 tablet by mouth daily.    . hydrocortisone (ANUSOL-HC) 25 MG suppository   7  . Melatonin 3 MG CAPS Take 3  mg by mouth at bedtime as needed (Sleep).     . metoprolol tartrate (LOPRESSOR) 25 MG tablet Take 1 tablet by mouth two  times daily 180 tablet 2  . Misc Natural Products (OSTEO BI-FLEX JOINT SHIELD) TABS Take 2 tablets by mouth daily.    . Multiple Minerals-Vitamins (CALCIUM CITRATE PLUS PO) Take 1 tablet by mouth 2 (two)  times daily.    . Multiple Vitamin (MULTIVITAMIN WITH MINERALS) TABS tablet Take 1 tablet by mouth daily.    . Omega-3 Fatty Acids (FISH OIL) 1200 MG CPDR Take 1 tablet by mouth daily.    . pantoprazole (PROTONIX) 40 MG tablet Take 1 tablet (40 mg total) by mouth daily. (Patient taking differently: Take 40 mg by mouth at bedtime. ) 30 tablet 1  . tamoxifen (NOLVADEX) 20 MG tablet TAKE 1 TABLET BY MOUTH EVERY DAY 30 tablet 1  . vitamin C (ASCORBIC ACID) 500 MG tablet Take 500 mg by mouth daily.     No current facility-administered medications for this visit.    PHYSICAL EXAMINATION: ECOG PERFORMANCE STATUS: 0 - Asymptomatic  Filed Vitals:   11/21/14 1143  BP: 131/65  Pulse: 67  Temp: 98.3 F (36.8 C)  Resp: 18   Filed Weights   11/21/14 1143  Weight: 150 lb 3.2 oz (68.13 kg)    GENERAL:alert, no distress and comfortable SKIN: skin color, texture, turgor are normal, no rashes or significant lesions EYES: normal, Conjunctiva are pink and non-injected, sclera clear OROPHARYNX:no exudate, no erythema and lips, buccal mucosa, and tongue normal  NECK: supple, thyroid normal size, non-tender, without nodularity LYMPH:  no palpable lymphadenopathy in the cervical, axillary or inguinal LUNGS: clear to auscultation and percussion with normal breathing effort HEART: regular rate & rhythm and no murmurs and no lower extremity edema ABDOMEN:abdomen soft, non-tender and normal bowel sounds Musculoskeletal:no cyanosis of digits and no clubbing  NEURO: alert & oriented x 3 with fluent speech, no focal motor/sensory deficits  LABORATORY DATA:  I have reviewed the data as listed   Chemistry      Component Value Date/Time   NA 138 08/14/2014 0925   NA 143 07/16/2014 1242   K 4.5 08/14/2014 0925   K 3.9 07/16/2014 1242   CL 101 08/14/2014 0925   CO2 28 08/14/2014 0925   CO2 27 07/16/2014 1242   BUN 17 08/14/2014 0925   BUN 16.2 07/16/2014 1242   CREATININE 0.81 08/14/2014 0925    CREATININE 0.9 07/16/2014 1242      Component Value Date/Time   CALCIUM 10.2 08/14/2014 0925   CALCIUM 10.5* 07/16/2014 1242   ALKPHOS 53 07/16/2014 1242   AST 30 07/16/2014 1242   ALT 30 07/16/2014 1242   BILITOT 0.69 07/16/2014 1242       Lab Results  Component Value Date   WBC 7.8 08/14/2014   HGB 13.3 08/14/2014   HCT 39.7 08/14/2014   MCV 92.3 08/14/2014   PLT 205 08/14/2014   NEUTROABS 5.5 07/16/2014   ASSESSMENT & PLAN:  Breast cancer of upper-inner quadrant of left female breast Left breast invasive ductal carcinoma status post lumpectomy 08/05/2014 0.4 cm tumor, T1 aN0 M0 stage IA ER 90% PR 90% HER-2 negative ratio 1.34, Ki-67 10%, started tamoxifen 08/22/2014  Tamoxifen toxicities: Tolerating tamoxifen extremely well without any major problems or concerns.  Breast cancer surveillance: 1. Breast exam 11/21/2014 is normal 2. Mammograms to be done in October 2016  Return to clinic in 6 months for follow-up  No orders  of the defined types were placed in this encounter.   The patient has a good understanding of the overall plan. she agrees with it. She will call with any problems that may develop before her next visit here.   Rulon Eisenmenger, MD

## 2014-11-21 NOTE — Assessment & Plan Note (Signed)
Left breast invasive ductal carcinoma status post lumpectomy 08/05/2014 0.4 cm tumor, T1 aN0 M0 stage IA ER 90% PR 90% HER-2 negative ratio 1.34, Ki-67 10%, started tamoxifen 08/22/2014  Tamoxifen toxicities: Tolerating tamoxifen extremely well without any major problems or concerns.  Breast cancer surveillance: 1. Breast exam 11/21/2014 is normal 2. Mammograms to be done in October 2016  Return to clinic in 6 months for follow-up

## 2014-12-17 ENCOUNTER — Encounter: Payer: Self-pay | Admitting: Cardiovascular Disease

## 2014-12-17 ENCOUNTER — Ambulatory Visit (INDEPENDENT_AMBULATORY_CARE_PROVIDER_SITE_OTHER): Payer: Medicare Other | Admitting: Cardiovascular Disease

## 2014-12-17 VITALS — BP 138/82 | HR 67 | Ht 64.0 in | Wt 148.7 lb

## 2014-12-17 DIAGNOSIS — I10 Essential (primary) hypertension: Secondary | ICD-10-CM

## 2014-12-17 DIAGNOSIS — I2583 Coronary atherosclerosis due to lipid rich plaque: Secondary | ICD-10-CM

## 2014-12-17 DIAGNOSIS — E785 Hyperlipidemia, unspecified: Secondary | ICD-10-CM | POA: Diagnosis not present

## 2014-12-17 DIAGNOSIS — I2510 Atherosclerotic heart disease of native coronary artery without angina pectoris: Secondary | ICD-10-CM

## 2014-12-17 NOTE — Assessment & Plan Note (Signed)
History of hyperlipidemia on atorvastatin 40 mg today followed by her PCP 

## 2014-12-17 NOTE — Patient Instructions (Signed)
Your physician wants you to follow-up in: 1 year with Dr Berry. You will receive a reminder letter in the mail two months in advance. If you don't receive a letter, please call our office to schedule the follow-up appointment.  

## 2014-12-17 NOTE — Progress Notes (Signed)
12/17/2014 [place]   27-Sep-1933  [id number]  Primary Physician Sheela Stack, MD Primary Cardiologist: Lorretta Harp MD Renae Gloss   HPI:   Ms. Jasmin Holloway is a delightful 81 year old thin appearing married Caucasian female mother of 2 children, grandmother to 2 grandchildren whose husband [name] is also a patient of mine. Before. The patient was formally a patient of  of Dr. Terance Ice. She has a history of CAD status post LAD/diagonal branch intervention by Dr. Rollene Fare back in March of 2000. She has not had an procedure since. Her last Myoview performed in 2000 and was nonischemic. She is completely asymptomatic is very active. She and her husband have since moved into [facility] and used their exercise facility frequently.Her other problems include history of treated hypertension and hyperlipidemia.   Current Outpatient Prescriptions  Medication Sig Dispense Refill  . aspirin EC 81 MG tablet Take 81 mg by mouth daily.    Marland Kitchen atorvastatin (LIPITOR) 40 MG tablet Take 1 tablet (40 mg total) by mouth daily. (Patient taking differently: Take 40 mg by mouth at bedtime. ) 90 tablet 3  . benazepril-hydrochlorthiazide (LOTENSIN HCT) 10-12.5 MG per tablet Take 1 tablet by mouth every morning.    . Biotin 5000 MCG CAPS Take 1 capsule by mouth daily.    . cholecalciferol (VITAMIN D) 1000 UNITS tablet Take 1,000 Units by mouth daily.    . diphenhydramine-acetaminophen (TYLENOL PM) 25-500 MG TABS Take 1 tablet by mouth at bedtime as needed.    Marland Kitchen EVENING PRIMROSE OIL PO Take 1 tablet by mouth daily.    . hydrocortisone (ANUSOL-HC) 25 MG suppository   7  . Melatonin 3 MG CAPS Take 3 mg by mouth at bedtime as needed (Sleep).     . metoprolol tartrate (LOPRESSOR) 25 MG tablet Take 1 tablet by mouth two  times daily 180 tablet 2  . Misc Natural Products (OSTEO BI-FLEX JOINT SHIELD) TABS Take 2 tablets by mouth daily.    . Multiple Minerals-Vitamins  (CALCIUM CITRATE PLUS PO) Take 1 tablet by mouth 2 (two) times daily.    . Multiple Vitamin (MULTIVITAMIN WITH MINERALS) TABS tablet Take 1 tablet by mouth daily.    . Omega-3 Fatty Acids (FISH OIL) 1200 MG CPDR Take 1 tablet by mouth daily.    . pantoprazole (PROTONIX) 40 MG tablet Take 1 tablet (40 mg total) by mouth daily. (Patient taking differently: Take 40 mg by mouth at bedtime. ) 30 tablet 1  . tamoxifen (NOLVADEX) 20 MG tablet TAKE 1 TABLET BY MOUTH EVERY DAY 30 tablet 1  . vitamin C (ASCORBIC ACID) 500 MG tablet Take 500 mg by mouth daily.     No current facility-administered medications for this visit.    Allergies  Allergen Reactions  . Codeine Nausea Only    History   Social History  . Marital Status: Married    Spouse Name: N/A  . Number of Children: N/A  . Years of Education: N/A   Occupational History  . Not on file.   Social History Main Topics  . Smoking status: Never Smoker   . Smokeless tobacco: Never Used  . Alcohol Use: 1.5 oz/week    3 Standard drinks or equivalent per week     Comment: wine  . Drug Use: No  . Sexual Activity: Not on file   Other Topics Concern  . Not on file   Social History Narrative     Review of Systems: General: negative  for chills, fever, night sweats or weight changes.  Cardiovascular: negative for chest pain, dyspnea on exertion, edema, orthopnea, palpitations, paroxysmal nocturnal dyspnea or shortness of breath Dermatological: negative for rash Respiratory: negative for cough or wheezing Urologic: negative for hematuria Abdominal: negative for nausea, vomiting, diarrhea, bright red blood per rectum, melena, or hematemesis Neurologic: negative for visual changes, syncope, or dizziness All other systems reviewed and are otherwise negative except as noted above.    Blood pressure 138/82, pulse 67, height 5\' 4"  (1.626 m), weight 148 lb 11.2 oz (67.45 kg).  General appearance: alert and no distress Neck: no adenopathy,  no carotid bruit, no JVD, supple, symmetrical, trachea midline and thyroid not enlarged, symmetric, no tenderness/mass/nodules Lungs: clear to auscultation bilaterally Heart: regular rate and rhythm, S1, S2 normal, no murmur, click, rub or gallop Extremities: extremities normal, atraumatic, no cyanosis or edema  EKG normal sinus rhythm at 67 without ST or T-wave changes. There was left axis deviation. I personally reviewed this EKG  ASSESSMENT AND PLAN:   Hyperlipidemia History of hyperlipidemia on atorvastatin 40 mg today followed by her PCP   Essential hypertension History of hypertension blood pressure measured at 130/82. She is on Cipro, hydrochlorothiazide and metoprolol. Continue current meds at current dosing   Coronary artery disease History of CAD status post LAD/diagonal branch intervention by Dr. Rollene Fare March 2000. She had a stress test performed after that which was nonischemic. She is completely asymptomatic.       Lorretta Harp MD FACP,FACC,FAHA, [facility] 12/17/2014 10:59 AM

## 2014-12-17 NOTE — Assessment & Plan Note (Signed)
History of hypertension blood pressure measured at 130/82. She is on Cipro, hydrochlorothiazide and metoprolol. Continue current meds at current dosing

## 2014-12-17 NOTE — Assessment & Plan Note (Signed)
History of CAD status post LAD/diagonal branch intervention by Dr. Rollene Fare March 2000. She had a stress test performed after that which was nonischemic. She is completely asymptomatic.

## 2014-12-30 DIAGNOSIS — M859 Disorder of bone density and structure, unspecified: Secondary | ICD-10-CM | POA: Diagnosis not present

## 2014-12-30 DIAGNOSIS — I10 Essential (primary) hypertension: Secondary | ICD-10-CM | POA: Diagnosis not present

## 2014-12-30 DIAGNOSIS — R7309 Other abnormal glucose: Secondary | ICD-10-CM | POA: Diagnosis not present

## 2014-12-30 DIAGNOSIS — N390 Urinary tract infection, site not specified: Secondary | ICD-10-CM | POA: Diagnosis not present

## 2014-12-30 DIAGNOSIS — R8299 Other abnormal findings in urine: Secondary | ICD-10-CM | POA: Diagnosis not present

## 2015-01-06 DIAGNOSIS — C50919 Malignant neoplasm of unspecified site of unspecified female breast: Secondary | ICD-10-CM | POA: Diagnosis not present

## 2015-01-06 DIAGNOSIS — I10 Essential (primary) hypertension: Secondary | ICD-10-CM | POA: Diagnosis not present

## 2015-01-06 DIAGNOSIS — Z6825 Body mass index (BMI) 25.0-25.9, adult: Secondary | ICD-10-CM | POA: Diagnosis not present

## 2015-01-06 DIAGNOSIS — Z1212 Encounter for screening for malignant neoplasm of rectum: Secondary | ICD-10-CM | POA: Diagnosis not present

## 2015-01-06 DIAGNOSIS — D126 Benign neoplasm of colon, unspecified: Secondary | ICD-10-CM | POA: Diagnosis not present

## 2015-01-06 DIAGNOSIS — F419 Anxiety disorder, unspecified: Secondary | ICD-10-CM | POA: Diagnosis not present

## 2015-01-06 DIAGNOSIS — E785 Hyperlipidemia, unspecified: Secondary | ICD-10-CM | POA: Diagnosis not present

## 2015-01-06 DIAGNOSIS — R7309 Other abnormal glucose: Secondary | ICD-10-CM | POA: Diagnosis not present

## 2015-01-06 DIAGNOSIS — Z1389 Encounter for screening for other disorder: Secondary | ICD-10-CM | POA: Diagnosis not present

## 2015-01-06 DIAGNOSIS — Z0000 Encounter for general adult medical examination without abnormal findings: Secondary | ICD-10-CM | POA: Diagnosis not present

## 2015-01-06 DIAGNOSIS — Z23 Encounter for immunization: Secondary | ICD-10-CM | POA: Diagnosis not present

## 2015-01-06 DIAGNOSIS — M859 Disorder of bone density and structure, unspecified: Secondary | ICD-10-CM | POA: Diagnosis not present

## 2015-01-06 DIAGNOSIS — Z9861 Coronary angioplasty status: Secondary | ICD-10-CM | POA: Diagnosis not present

## 2015-01-19 ENCOUNTER — Other Ambulatory Visit: Payer: Self-pay | Admitting: Cardiovascular Disease

## 2015-01-19 NOTE — Telephone Encounter (Signed)
Rx has been sent to the pharmacy electronically. ° °

## 2015-03-02 DIAGNOSIS — L821 Other seborrheic keratosis: Secondary | ICD-10-CM | POA: Diagnosis not present

## 2015-03-02 DIAGNOSIS — L570 Actinic keratosis: Secondary | ICD-10-CM | POA: Diagnosis not present

## 2015-03-02 DIAGNOSIS — D0472 Carcinoma in situ of skin of left lower limb, including hip: Secondary | ICD-10-CM | POA: Diagnosis not present

## 2015-03-02 DIAGNOSIS — L304 Erythema intertrigo: Secondary | ICD-10-CM | POA: Diagnosis not present

## 2015-03-02 DIAGNOSIS — Z85828 Personal history of other malignant neoplasm of skin: Secondary | ICD-10-CM | POA: Diagnosis not present

## 2015-03-02 DIAGNOSIS — L940 Localized scleroderma [morphea]: Secondary | ICD-10-CM | POA: Diagnosis not present

## 2015-03-13 DIAGNOSIS — C50912 Malignant neoplasm of unspecified site of left female breast: Secondary | ICD-10-CM | POA: Diagnosis not present

## 2015-03-16 DIAGNOSIS — D0472 Carcinoma in situ of skin of left lower limb, including hip: Secondary | ICD-10-CM | POA: Diagnosis not present

## 2015-03-16 DIAGNOSIS — Z85828 Personal history of other malignant neoplasm of skin: Secondary | ICD-10-CM | POA: Diagnosis not present

## 2015-05-21 NOTE — Assessment & Plan Note (Signed)
Left breast invasive ductal carcinoma status post lumpectomy 08/05/2014 0.4 cm tumor, T1 aN0 M0 stage IA ER 90% PR 90% HER-2 negative ratio 1.34, Ki-67 10%, started tamoxifen 08/22/2014  Tamoxifen toxicities: Tolerating tamoxifen extremely well without any major problems or concerns.  Breast cancer surveillance: 1. Breast exam 05/22/2015 is normal 2. Mammograms to be done in October 2016  Return to clinic in 6 months for follow-up

## 2015-05-22 ENCOUNTER — Encounter: Payer: Self-pay | Admitting: Hematology and Oncology

## 2015-05-22 ENCOUNTER — Telehealth: Payer: Self-pay | Admitting: Hematology and Oncology

## 2015-05-22 ENCOUNTER — Ambulatory Visit (HOSPITAL_BASED_OUTPATIENT_CLINIC_OR_DEPARTMENT_OTHER): Payer: Medicare Other | Admitting: Hematology and Oncology

## 2015-05-22 VITALS — BP 114/63 | HR 72 | Temp 98.3°F | Resp 18 | Ht 64.0 in | Wt 147.5 lb

## 2015-05-22 DIAGNOSIS — C50212 Malignant neoplasm of upper-inner quadrant of left female breast: Secondary | ICD-10-CM | POA: Diagnosis not present

## 2015-05-22 DIAGNOSIS — I2510 Atherosclerotic heart disease of native coronary artery without angina pectoris: Secondary | ICD-10-CM

## 2015-05-22 NOTE — Progress Notes (Signed)
Patient Care Team: Reynold Bowen, MD as PCP - General (Endocrinology) Nicholas Lose, MD as Consulting Physician (Hematology and Oncology) Excell Seltzer, MD as Consulting Physician (General Surgery) Eppie Gibson, MD as Attending Physician (Radiation Oncology) Holley Bouche, NP as Nurse Practitioner (Nurse Practitioner)  DIAGNOSIS: Breast cancer of upper-inner quadrant of left female breast   Staging form: Breast, AJCC 7th Edition     Clinical stage from 07/16/2014: Stage IA (T1b, N0, M0) - Unsigned       Staging comments: Staged at breast conference on 11.11.15      Pathologic stage from 08/05/2014: Stage Unknown (T1a, NX, cM0) - Signed by Rulon Eisenmenger, MD on 08/14/2014       Staging comments: Staging from final excisional pathology by Dr. Lyndon Code.    SUMMARY OF ONCOLOGIC HISTORY:   Breast cancer of upper-inner quadrant of left female breast   07/01/2014 Breast MRI Left breast 10:00 position 0.8 x 0.7 x 0.6 cm lobulated enhancing mass, liver cysts   07/07/2014 Initial Diagnosis Left breast invasive ductal carcinoma with DCIS with calcifications, grade 2 with micropapillary features ER 90%, PR 90%, HER-2 negative ratio 1.34, Ki-67 10%   08/05/2014 Surgery Left breast lumpectomy: 0.4 cm IDC, with DCIS, margins negative   09/05/2014 -  Anti-estrogen oral therapy Tamoxifen $RemoveBeforeDEI'20mg'[id number]$  once daily for 5 years (will complete in 09/2019)    CHIEF COMPLIANT: Follow-up on tamoxifen  INTERVAL HISTORY: Jasmin Holloway is a 81 year old with above-mentioned history of left breast cancer treated with lumpectomy and is currently on tamoxifen adjuvant therapy. She is tolerating tamoxifen extremely well without any major problems or concerns. She does have occasional hot flashes especially at bedtime. Denies any myalgias. Denies any vaginal bleeding.  REVIEW OF SYSTEMS:   Constitutional: Denies fevers, chills or abnormal weight loss Eyes: Denies blurriness of vision Ears, nose, mouth, throat, and face:  Denies mucositis or sore throat Respiratory: Denies cough, dyspnea or wheezes Cardiovascular: Denies palpitation, chest discomfort or lower extremity swelling Gastrointestinal:  Denies nausea, heartburn or change in bowel habits Skin: Denies abnormal skin rashes Lymphatics: Denies new lymphadenopathy or easy bruising Neurological:Denies numbness, tingling or new weaknesses Behavioral/Psych: Mood is stable, no new changes  Breast:  denies any pain or lumps or nodules in either breasts All other systems were reviewed with the patient and are negative.  I have reviewed the past medical history, past surgical history, social history and family history with the patient and they are unchanged from previous note.  ALLERGIES:  is allergic to codeine.  MEDICATIONS:  Current Outpatient Prescriptions  Medication Sig Dispense Refill  . aspirin EC 81 MG tablet Take 81 mg by mouth daily.    Marland Kitchen atorvastatin (LIPITOR) 40 MG tablet Take 1 tablet (40 mg total) by mouth daily. (Patient taking differently: Take 40 mg by mouth at bedtime. ) 90 tablet 3  . benazepril-hydrochlorthiazide (LOTENSIN HCT) 10-12.5 MG per tablet Take 1 tablet by mouth  daily 90 tablet 2  . Biotin 5000 MCG CAPS Take 1 capsule by mouth daily.    . cholecalciferol (VITAMIN D) 1000 UNITS tablet Take 1,000 Units by mouth daily.    . diphenhydramine-acetaminophen (TYLENOL PM) 25-500 MG TABS Take 1 tablet by mouth at bedtime as needed.    Marland Kitchen EVENING PRIMROSE OIL PO Take 1 tablet by mouth daily.    . hydrocortisone (ANUSOL-HC) 25 MG suppository   7  . Melatonin 3 MG CAPS Take 3 mg by mouth at bedtime as needed (Sleep).     Marland Kitchen  metoprolol tartrate (LOPRESSOR) 25 MG tablet Take 1 tablet by mouth two  times daily 180 tablet 2  . Misc Natural Products (OSTEO BI-FLEX JOINT SHIELD) TABS Take 2 tablets by mouth daily.    . Multiple Minerals-Vitamins (CALCIUM CITRATE PLUS PO) Take 1 tablet by mouth 2 (two) times daily.    . Multiple Vitamin  (MULTIVITAMIN WITH MINERALS) TABS tablet Take 1 tablet by mouth daily.    . Omega-3 Fatty Acids (FISH OIL) 1200 MG CPDR Take 1 tablet by mouth daily.    . pantoprazole (PROTONIX) 40 MG tablet Take 1 tablet (40 mg total) by mouth daily. (Patient taking differently: Take 40 mg by mouth at bedtime. ) 30 tablet 1  . tamoxifen (NOLVADEX) 20 MG tablet TAKE 1 TABLET BY MOUTH EVERY DAY 30 tablet 1  . vitamin C (ASCORBIC ACID) 500 MG tablet Take 500 mg by mouth daily.     No current facility-administered medications for this visit.    PHYSICAL EXAMINATION: ECOG PERFORMANCE STATUS: 0 - Asymptomatic  Filed Vitals:   05/22/15 1059  BP: 114/63  Pulse: 72  Temp: 98.3 F (36.8 C)  Resp: 18   Filed Weights   05/22/15 1059  Weight: 147 lb 8 oz (66.906 kg)    GENERAL:alert, no distress and comfortable SKIN: skin color, texture, turgor are normal, no rashes or significant lesions EYES: normal, Conjunctiva are pink and non-injected, sclera clear OROPHARYNX:no exudate, no erythema and lips, buccal mucosa, and tongue normal  NECK: supple, thyroid normal size, non-tender, without nodularity LYMPH:  no palpable lymphadenopathy in the cervical, axillary or inguinal LUNGS: clear to auscultation and percussion with normal breathing effort HEART: regular rate & rhythm and no murmurs and no lower extremity edema ABDOMEN:abdomen soft, non-tender and normal bowel sounds Musculoskeletal:no cyanosis of digits and no clubbing  NEURO: alert & oriented x 3 with fluent speech, no focal motor/sensory deficits BREAST: No palpable masses or nodules in either right or left breasts. No palpable axillary supraclavicular or infraclavicular adenopathy no breast tenderness or nipple discharge. (exam performed in the presence of a chaperone)  LABORATORY DATA:  I have reviewed the data as listed   Chemistry      Component Value Date/Time   NA 138 08/14/2014 0925   NA 143 07/16/2014 1242   K 4.5 08/14/2014 0925   K 3.9  07/16/2014 1242   CL 101 08/14/2014 0925   CO2 28 08/14/2014 0925   CO2 27 07/16/2014 1242   BUN 17 08/14/2014 0925   BUN 16.2 07/16/2014 1242   CREATININE 0.81 08/14/2014 0925   CREATININE 0.9 07/16/2014 1242      Component Value Date/Time   CALCIUM 10.2 08/14/2014 0925   CALCIUM 10.5* 07/16/2014 1242   ALKPHOS 53 07/16/2014 1242   AST 30 07/16/2014 1242   ALT 30 07/16/2014 1242   BILITOT 0.69 07/16/2014 1242       Lab Results  Component Value Date   WBC 7.8 08/14/2014   HGB 13.3 08/14/2014   HCT 39.7 08/14/2014   MCV 92.3 08/14/2014   PLT 205 08/14/2014   NEUTROABS 5.5 07/16/2014   ASSESSMENT & PLAN:  Breast cancer of upper-inner quadrant of left female breast Left breast invasive ductal carcinoma status post lumpectomy 08/05/2014 0.4 cm tumor, T1 aN0 M0 stage IA ER 90% PR 90% HER-2 negative ratio 1.34, Ki-67 10%, started tamoxifen 08/22/2014  Tamoxifen toxicities: Tolerating tamoxifen extremely well without any major problems or concerns. Patient wakes up a few times in the night because  she cannot stay asleep Hot flashes not been a major problem occasionally they do keep her awake.  Breast cancer surveillance: 1. Breast exam 05/22/2015 is normal 2. Mammograms to be done in October 2016  Return to clinic in 6 months for follow-up  No orders of the defined types were placed in this encounter.   The patient has a good understanding of the overall plan. she agrees with it. she will call with any problems that may develop before the next visit here.   Rulon Eisenmenger, MD

## 2015-05-22 NOTE — Telephone Encounter (Signed)
Gave adn printed appt sched and avs for pt March 2017

## 2015-06-25 DIAGNOSIS — Z23 Encounter for immunization: Secondary | ICD-10-CM | POA: Diagnosis not present

## 2015-07-01 DIAGNOSIS — Z961 Presence of intraocular lens: Secondary | ICD-10-CM | POA: Diagnosis not present

## 2015-07-01 DIAGNOSIS — H52203 Unspecified astigmatism, bilateral: Secondary | ICD-10-CM | POA: Diagnosis not present

## 2015-07-06 ENCOUNTER — Encounter: Payer: Self-pay | Admitting: Adult Health

## 2015-07-06 NOTE — Progress Notes (Signed)
A birthday card was mailed to the patient today on behalf of the Survivorship Program at [facility].   Gretchen Dawson, NP Survivorship Program [facility] [phone]  

## 2015-07-27 DIAGNOSIS — Z6825 Body mass index (BMI) 25.0-25.9, adult: Secondary | ICD-10-CM | POA: Diagnosis not present

## 2015-07-27 DIAGNOSIS — M859 Disorder of bone density and structure, unspecified: Secondary | ICD-10-CM | POA: Diagnosis not present

## 2015-07-27 DIAGNOSIS — R7309 Other abnormal glucose: Secondary | ICD-10-CM | POA: Diagnosis not present

## 2015-07-27 DIAGNOSIS — C50919 Malignant neoplasm of unspecified site of unspecified female breast: Secondary | ICD-10-CM | POA: Diagnosis not present

## 2015-07-27 DIAGNOSIS — Z9861 Coronary angioplasty status: Secondary | ICD-10-CM | POA: Diagnosis not present

## 2015-07-27 DIAGNOSIS — I10 Essential (primary) hypertension: Secondary | ICD-10-CM | POA: Diagnosis not present

## 2015-07-27 DIAGNOSIS — F419 Anxiety disorder, unspecified: Secondary | ICD-10-CM | POA: Diagnosis not present

## 2015-07-27 DIAGNOSIS — E784 Other hyperlipidemia: Secondary | ICD-10-CM | POA: Diagnosis not present

## 2015-07-27 DIAGNOSIS — I2510 Atherosclerotic heart disease of native coronary artery without angina pectoris: Secondary | ICD-10-CM | POA: Diagnosis not present

## 2015-09-03 DIAGNOSIS — C50912 Malignant neoplasm of unspecified site of left female breast: Secondary | ICD-10-CM | POA: Diagnosis not present

## 2015-09-08 ENCOUNTER — Ambulatory Visit
Admission: RE | Admit: 2015-09-08 | Discharge: 2015-09-08 | Disposition: A | Discharge status: Home / Self Care | Payer: Medicare Other | Source: Ambulatory Visit | Attending: Hematology and Oncology | Admitting: Hematology and Oncology

## 2015-09-08 DIAGNOSIS — R922 Inconclusive mammogram: Secondary | ICD-10-CM | POA: Diagnosis not present

## 2015-09-08 DIAGNOSIS — C50212 Malignant neoplasm of upper-inner quadrant of left female breast: Secondary | ICD-10-CM

## 2015-09-13 ENCOUNTER — Other Ambulatory Visit: Payer: Self-pay | Admitting: Cardiovascular Disease

## 2015-09-13 ENCOUNTER — Other Ambulatory Visit: Payer: Self-pay | Admitting: Hematology and Oncology

## 2015-09-14 NOTE — Telephone Encounter (Signed)
Chart reviewed.

## 2015-09-14 NOTE — Telephone Encounter (Signed)
Rx request sent to pharmacy.  

## 2015-11-16 ENCOUNTER — Ambulatory Visit: Payer: Medicare Other | Admitting: Hematology and Oncology

## 2015-11-17 NOTE — Assessment & Plan Note (Signed)
Left breast invasive ductal carcinoma status post lumpectomy 08/05/2014 0.4 cm tumor, T1 aN0 M0 stage IA ER 90% PR 90% HER-2 negative ratio 1.34, Ki-67 10%, started tamoxifen 08/22/2014  Tamoxifen toxicities: Tolerating tamoxifen extremely well without any major problems or concerns. Patient wakes up a few times in the night because she cannot stay asleep Hot flashes not been a major problem occasionally they do keep her awake.  Breast cancer surveillance: 1. Breast exam 05/22/2015 is normal 2. Mammograms 09/08/15: Normal density Cat C  Return to clinic in 6 months for follow-up

## 2015-11-18 ENCOUNTER — Telehealth: Payer: Self-pay | Admitting: Hematology and Oncology

## 2015-11-18 ENCOUNTER — Encounter: Payer: Self-pay | Admitting: Hematology and Oncology

## 2015-11-18 ENCOUNTER — Ambulatory Visit (HOSPITAL_BASED_OUTPATIENT_CLINIC_OR_DEPARTMENT_OTHER): Payer: Medicare Other | Admitting: Hematology and Oncology

## 2015-11-18 VITALS — BP 137/62 | HR 65 | Temp 97.6°F | Resp 18 | Ht 64.0 in | Wt 150.4 lb

## 2015-11-18 DIAGNOSIS — C50212 Malignant neoplasm of upper-inner quadrant of left female breast: Secondary | ICD-10-CM

## 2015-11-18 NOTE — Progress Notes (Signed)
Patient Care Team: Reynold Bowen, MD as PCP - General (Endocrinology) Nicholas Lose, MD as Consulting Physician (Hematology and Oncology) Excell Seltzer, MD as Consulting Physician (General Surgery) Eppie Gibson, MD as Attending Physician (Radiation Oncology) Holley Bouche, NP as Nurse Practitioner (Nurse Practitioner)  DIAGNOSIS: Breast cancer of upper-inner quadrant of left female breast [facility])   Staging form: Breast, AJCC 7th Edition     Clinical stage from 07/16/2014: Stage IA (T1b, N0, M0) - Unsigned       Staging comments: Staged at breast conference on 11.11.15      Pathologic stage from 08/05/2014: Stage Unknown (T1a, NX, cM0) - Signed by Rulon Eisenmenger, MD on 08/14/2014       Staging comments: Staging from final excisional pathology by Dr. Lyndon Code.  SUMMARY OF ONCOLOGIC HISTORY:   Breast cancer of upper-inner quadrant of left female breast ([place])   07/01/2014 Breast MRI Left breast 10:00 position 0.8 x 0.7 x 0.6 cm lobulated enhancing mass, liver cysts   07/07/2014 Initial Diagnosis Left breast invasive ductal carcinoma with DCIS with calcifications, grade 2 with micropapillary features ER 90%, PR 90%, HER-2 negative ratio 1.34, Ki-67 10%   08/05/2014 Surgery Left breast lumpectomy: 0.4 cm IDC, with DCIS, margins negative   09/05/2014 -  Anti-estrogen oral therapy Tamoxifen 37m once daily for 5 years (will complete in 09/2019)    CHIEF COMPLIANT: Follow-up on tamoxifen therapy  INTERVAL HISTORY: Jasmin ESTEVEis a 82year old with above-mentioned history left breast cancer currently on adjuvant tamoxifen therapy. She appears to be tolerating it fairly well. She does not have any hot flashes or myalgias. She stays very active at [facility] by walking and doing regular exercises. She denies any lumps or nodules in the breasts. Mammograms were in January 2017 were normal.  REVIEW OF SYSTEMS:   Constitutional: Denies fevers, chills or abnormal weight loss Eyes: Denies  blurriness of vision Ears, nose, mouth, throat, and face: Denies mucositis or sore throat Respiratory: Denies cough, dyspnea or wheezes Cardiovascular: Denies palpitation, chest discomfort Gastrointestinal:  Denies nausea, heartburn or change in bowel habits Skin: Denies abnormal skin rashes Lymphatics: Denies new lymphadenopathy or easy bruising Neurological:Denies numbness, tingling or new weaknesses Behavioral/Psych: Mood is stable, no new changes  Extremities: No lower extremity edema Breast:  denies any pain or lumps or nodules in either breasts All other systems were reviewed with the patient and are negative.  I have reviewed the past medical history, past surgical history, social history and family history with the patient and they are unchanged from previous note.  ALLERGIES:  is allergic to codeine.  MEDICATIONS:  Current Outpatient Prescriptions  Medication Sig Dispense Refill  . aspirin EC 81 MG tablet Take 81 mg by mouth daily.    .Marland Kitchenatorvastatin (LIPITOR) 40 MG tablet Take 1 tablet (40 mg total) by mouth daily. (Patient taking differently: Take 40 mg by mouth at bedtime. ) 90 tablet 3  . benazepril-hydrochlorthiazide (LOTENSIN HCT) 10-12.5 MG tablet Take 1 tablet by mouth  daily 90 tablet 0  . Biotin 5000 MCG CAPS Take 1 capsule by mouth daily.    . cholecalciferol (VITAMIN D) 1000 UNITS tablet Take 1,000 Units by mouth daily.    . diphenhydramine-acetaminophen (TYLENOL PM) 25-500 MG TABS Take 1 tablet by mouth at bedtime as needed.    .Marland KitchenEVENING PRIMROSE OIL PO Take 1 tablet by mouth daily.    . hydrocortisone (ANUSOL-HC) 25 MG suppository   7  . Melatonin 3 MG CAPS Take  3 mg by mouth at bedtime as needed (Sleep).     . metoprolol tartrate (LOPRESSOR) 25 MG tablet Take 1 tablet by mouth two  times daily 180 tablet 0  . Misc Natural Products (OSTEO BI-FLEX JOINT SHIELD) TABS Take 2 tablets by mouth daily.    . Multiple Minerals-Vitamins (CALCIUM CITRATE PLUS PO) Take 1  tablet by mouth 2 (two) times daily.    . Multiple Vitamin (MULTIVITAMIN WITH MINERALS) TABS tablet Take 1 tablet by mouth daily.    . Omega-3 Fatty Acids (FISH OIL) 1200 MG CPDR Take 1 tablet by mouth daily.    . pantoprazole (PROTONIX) 40 MG tablet Take 1 tablet (40 mg total) by mouth daily. (Patient taking differently: Take 40 mg by mouth at bedtime. ) 30 tablet 1  . tamoxifen (NOLVADEX) 20 MG tablet TAKE 1 TABLET BY MOUTH EVERY DAY 30 tablet 1  . tamoxifen (NOLVADEX) 20 MG tablet Take 1 tablet by mouth  daily 90 tablet 0  . vitamin C (ASCORBIC ACID) 500 MG tablet Take 500 mg by mouth daily.     No current facility-administered medications for this visit.    PHYSICAL EXAMINATION: ECOG PERFORMANCE STATUS: 0 - Asymptomatic  Filed Vitals:   11/18/15 0844  BP: 137/62  Pulse: 65  Temp: 97.6 F (36.4 C)  Resp: 18   Filed Weights   11/18/15 0844  Weight: 150 lb 6.4 oz (68.221 kg)    GENERAL:alert, no distress and comfortable SKIN: skin color, texture, turgor are normal, no rashes or significant lesions EYES: normal, Conjunctiva are pink and non-injected, sclera clear OROPHARYNX:no exudate, no erythema and lips, buccal mucosa, and tongue normal  NECK: supple, thyroid normal size, non-tender, without nodularity LYMPH:  no palpable lymphadenopathy in the cervical, axillary or inguinal LUNGS: clear to auscultation and percussion with normal breathing effort HEART: regular rate & rhythm and no murmurs and no lower extremity edema ABDOMEN:abdomen soft, non-tender and normal bowel sounds MUSCULOSKELETAL:no cyanosis of digits and no clubbing  NEURO: alert & oriented x 3 with fluent speech, no focal motor/sensory deficits EXTREMITIES: No lower extremity edema BREAST: No palpable masses or nodules in either right or left breasts. No palpable axillary supraclavicular or infraclavicular adenopathy no breast tenderness or nipple discharge. (exam performed in the presence of a  chaperone)  LABORATORY DATA:  I have reviewed the data as listed   Chemistry      Component Value Date/Time   NA 138 08/14/2014 0925   NA 143 07/16/2014 1242   K 4.5 08/14/2014 0925   K 3.9 07/16/2014 1242   CL 101 08/14/2014 0925   CO2 28 08/14/2014 0925   CO2 27 07/16/2014 1242   BUN 17 08/14/2014 0925   BUN 16.2 07/16/2014 1242   CREATININE 0.81 08/14/2014 0925   CREATININE 0.9 07/16/2014 1242      Component Value Date/Time   CALCIUM 10.2 08/14/2014 0925   CALCIUM 10.5* 07/16/2014 1242   ALKPHOS 53 07/16/2014 1242   AST 30 07/16/2014 1242   ALT 30 07/16/2014 1242   BILITOT 0.69 07/16/2014 1242     Lab Results  Component Value Date   WBC 7.8 08/14/2014   HGB 13.3 08/14/2014   HCT 39.7 08/14/2014   MCV 92.3 08/14/2014   PLT 205 08/14/2014   NEUTROABS 5.5 07/16/2014   ASSESSMENT & PLAN:  Breast cancer of upper-inner quadrant of left female breast Left breast invasive ductal carcinoma status post lumpectomy 08/05/2014 0.4 cm tumor, T1 aN0 M0 stage IA ER 90%  PR 90% HER-2 negative ratio 1.34, Ki-67 10%, started tamoxifen 08/22/2014  Tamoxifen toxicities: Tolerating tamoxifen extremely well without any major problems or concerns. Patient wakes up a few times in the night because she cannot stay asleep Hot flashes not been a major problem occasionally they do keep her awake.  Breast cancer surveillance: 1. Breast exam 05/22/2015 is normal 2. Mammograms 09/08/15: Normal density Cat C  Return to clinic in 6 months for follow-up And after that once a year   No orders of the defined types were placed in this encounter.   The patient has a good understanding of the overall plan. she agrees with it. she will call with any problems that may develop before the next visit here.   Rulon Eisenmenger, MD 11/18/2015

## 2015-11-18 NOTE — Telephone Encounter (Signed)
appt made and avs printed °

## 2015-12-03 ENCOUNTER — Emergency Department (HOSPITAL_COMMUNITY)
Admission: EM | Admit: 2015-12-03 | Discharge: 2015-12-03 | Disposition: A | Discharge status: Home / Self Care | Payer: Medicare Other | Attending: Emergency Medicine | Admitting: Emergency Medicine

## 2015-12-03 ENCOUNTER — Encounter (HOSPITAL_COMMUNITY): Payer: Self-pay | Admitting: *Deleted

## 2015-12-03 DIAGNOSIS — E785 Hyperlipidemia, unspecified: Secondary | ICD-10-CM | POA: Diagnosis not present

## 2015-12-03 DIAGNOSIS — Z7982 Long term (current) use of aspirin: Secondary | ICD-10-CM | POA: Diagnosis not present

## 2015-12-03 DIAGNOSIS — N3000 Acute cystitis without hematuria: Secondary | ICD-10-CM | POA: Insufficient documentation

## 2015-12-03 DIAGNOSIS — E860 Dehydration: Secondary | ICD-10-CM | POA: Diagnosis not present

## 2015-12-03 DIAGNOSIS — Z79899 Other long term (current) drug therapy: Secondary | ICD-10-CM | POA: Insufficient documentation

## 2015-12-03 DIAGNOSIS — B9689 Other specified bacterial agents as the cause of diseases classified elsewhere: Secondary | ICD-10-CM | POA: Diagnosis not present

## 2015-12-03 DIAGNOSIS — Z9861 Coronary angioplasty status: Secondary | ICD-10-CM | POA: Insufficient documentation

## 2015-12-03 DIAGNOSIS — K219 Gastro-esophageal reflux disease without esophagitis: Secondary | ICD-10-CM | POA: Insufficient documentation

## 2015-12-03 DIAGNOSIS — M19041 Primary osteoarthritis, right hand: Secondary | ICD-10-CM | POA: Diagnosis not present

## 2015-12-03 DIAGNOSIS — I2510 Atherosclerotic heart disease of native coronary artery without angina pectoris: Secondary | ICD-10-CM | POA: Diagnosis not present

## 2015-12-03 DIAGNOSIS — R197 Diarrhea, unspecified: Secondary | ICD-10-CM | POA: Insufficient documentation

## 2015-12-03 DIAGNOSIS — M19042 Primary osteoarthritis, left hand: Secondary | ICD-10-CM | POA: Insufficient documentation

## 2015-12-03 DIAGNOSIS — Z853 Personal history of malignant neoplasm of breast: Secondary | ICD-10-CM | POA: Diagnosis not present

## 2015-12-03 DIAGNOSIS — I10 Essential (primary) hypertension: Secondary | ICD-10-CM | POA: Insufficient documentation

## 2015-12-03 LAB — URINALYSIS, ROUTINE W REFLEX MICROSCOPIC
Bilirubin Urine: NEGATIVE
Glucose, UA: NEGATIVE mg/dL
Ketones, ur: 15 mg/dL — AB
NITRITE: NEGATIVE
PH: 5.5 (ref 5.0–8.0)
Protein, ur: NEGATIVE mg/dL
SPECIFIC GRAVITY, URINE: 1.017 (ref 1.005–1.030)

## 2015-12-03 LAB — COMPREHENSIVE METABOLIC PANEL
ALK PHOS: 36 U/L — AB (ref 38–126)
ALT: 61 U/L — ABNORMAL HIGH (ref 14–54)
ANION GAP: 9 (ref 5–15)
AST: 59 U/L — ABNORMAL HIGH (ref 15–41)
Albumin: 3.5 g/dL (ref 3.5–5.0)
BUN: 13 mg/dL (ref 6–20)
CO2: 23 mmol/L (ref 22–32)
Calcium: 8.4 mg/dL — ABNORMAL LOW (ref 8.9–10.3)
Chloride: 103 mmol/L (ref 101–111)
Creatinine, Ser: 0.86 mg/dL (ref 0.44–1.00)
GFR calc non Af Amer: >60 mL/min (ref >60)
Glucose, Bld: 122 mg/dL — ABNORMAL HIGH (ref 65–99)
POTASSIUM: 3.4 mmol/L — AB (ref 3.5–5.1)
SODIUM: 135 mmol/L (ref 135–145)
Total Bilirubin: 0.7 mg/dL (ref 0.3–1.2)
Total Protein: 7 g/dL (ref 6.5–8.1)

## 2015-12-03 LAB — CBC WITH DIFFERENTIAL/PLATELET
BASOS ABS: 0 10*3/uL (ref 0.0–0.1)
BASOS PCT: 0 %
Eosinophils Absolute: 0 10*3/uL (ref 0.0–0.7)
Eosinophils Relative: 0 %
HEMATOCRIT: 38.8 % (ref 36.0–46.0)
HEMOGLOBIN: 13.6 g/dL (ref 12.0–15.0)
LYMPHS PCT: 21 %
Lymphs Abs: 1.4 10*3/uL (ref 0.7–4.0)
MCH: 31.7 pg (ref 26.0–34.0)
MCHC: 35.1 g/dL (ref 30.0–36.0)
MCV: 90.4 fL (ref 78.0–100.0)
MONOS PCT: 8 %
Monocytes Absolute: 0.5 10*3/uL (ref 0.1–1.0)
NEUTROS ABS: 4.8 10*3/uL (ref 1.7–7.7)
NEUTROS PCT: 71 %
Platelets: 94 10*3/uL — ABNORMAL LOW (ref 150–400)
RBC: 4.29 MIL/uL (ref 3.87–5.11)
RDW: 13.5 % (ref 11.5–15.5)
WBC: 6.8 10*3/uL (ref 4.0–10.5)

## 2015-12-03 LAB — URINE MICROSCOPIC-ADD ON

## 2015-12-03 MED ORDER — SODIUM CHLORIDE 0.9 % IV BOLUS (SEPSIS)
1000.0000 mL | Freq: Once | INTRAVENOUS | Status: AC
  Administered 2015-12-03: 1000 mL via INTRAVENOUS

## 2015-12-03 MED ORDER — ONDANSETRON HCL 4 MG/2ML IJ SOLN
4.0000 mg | Freq: Once | INTRAMUSCULAR | Status: AC
  Administered 2015-12-03: 4 mg via INTRAVENOUS
  Filled 2015-12-03: qty 2

## 2015-12-03 MED ORDER — ONDANSETRON 4 MG PO TBDP: 4.0000 mg | ORAL_TABLET | Freq: Three times a day (TID) | ORAL | Status: DC | PRN

## 2015-12-03 MED ORDER — CEPHALEXIN 500 MG PO CAPS: 500.0000 mg | ORAL_CAPSULE | Freq: Two times a day (BID) | ORAL | Status: DC

## 2015-12-03 MED ORDER — ACETAMINOPHEN 325 MG PO TABS
650.0000 mg | ORAL_TABLET | Freq: Once | ORAL | Status: AC
  Administered 2015-12-03: 650 mg via ORAL
  Filled 2015-12-03: qty 2

## 2015-12-03 MED ORDER — LOPERAMIDE HCL 2 MG PO CAPS: 2.0000 mg | ORAL_CAPSULE | Freq: Four times a day (QID) | ORAL | Status: DC | PRN

## 2015-12-03 NOTE — ED Provider Notes (Signed)
CSN: [id number]     Arrival date & time 12/03/15  1600 History   First MD Initiated Contact with Patient 12/03/15 1625     Chief Complaint  Patient presents with  . Diarrhea      Patient is a 82 y.o. female presenting with diarrhea.  Diarrhea Associated symptoms: myalgias   Associated symptoms: no abdominal pain, no headaches and no vomiting   Patient presents after feeling bad for last few days. States she went to the beach and did not much of appetite and felt fatigued. She said today she began to have some diarrhea. She states she's had numerous episodes of watery diarrhea. She's had some nausea without actual vomiting. The nauseousness with it for 2 days. No abdominal pain. States she did not take her temperature but is not surprised that she had a fever. No dysuria. She has a slight headache. No chest pain.  Past Medical History  Diagnosis Date  . Hypertension   . Hyperlipidemia   . Mitral regurgitation     mild 03/29/2013 echo  . Aortic sclerosis ([place])     03/29/13 echo  . Breast cancer ([place])   . GERD (gastroesophageal reflux disease)   . Coronary artery disease     DR. BERRY IS PT'S CARDIOLOGIST  . Arthritis     HANDS  . PONV (postoperative nausea and vomiting)     PT HAD SEVERE NAUSEA WAKING UP IN RR AFTER BREAST LUMPECTOMY   Past Surgical History  Procedure Laterality Date  . Coronary angioplasty with stent placement  11/16/1998    stent to the LAD and PTCA side branch diagonal  . Nm myocar perf wall motion  07/13/2009  . Appendectomy    . Colonoscopy    . Dilation and curettage of uterus    . Tonsillectomy    . Eye surgery      both cataracts  . Breast lumpectomy with radioactive seed localization Left 08/05/2014    Procedure: SEED LOCALIZED LEFT BREAST LUMPECTOMY;  Surgeon: Excell Seltzer, MD;  Location: [facility];  Service: General;  Laterality: Left;  . Re-excision of breast lumpectomy Left 08/18/2014    Procedure: RE-EXCISION OF LEFT BREAST  LUMPECTOMY;  Surgeon: Excell Seltzer, MD;  Location: [facility];  Service: General;  Laterality: Left;   Family History  Problem Relation Age of Onset  . Cancer Mother   . Heart attack Father    Social History  Substance Use Topics  . Smoking status: Never Smoker   . Smokeless tobacco: Never Used  . Alcohol Use: 1.5 oz/week    3 Standard drinks or equivalent per week     Comment: wine   OB History    No data available     Review of Systems  Constitutional: Positive for appetite change and fatigue. Negative for activity change.  Eyes: Negative for pain.  Respiratory: Negative for chest tightness and shortness of breath.   Cardiovascular: Negative for chest pain and leg swelling.  Gastrointestinal: Positive for nausea and diarrhea. Negative for vomiting and abdominal pain.  Genitourinary: Negative for flank pain.  Musculoskeletal: Positive for myalgias. Negative for back pain and neck stiffness.  Skin: Negative for rash.  Neurological: Positive for light-headedness. Negative for weakness, numbness and headaches.  Psychiatric/Behavioral: Negative for behavioral problems.      Allergies  Codeine  Home Medications   Prior to Admission medications   Medication Sig Start Date End Date Taking? Authorizing Provider  aspirin EC 81 MG tablet Take 81 mg  by mouth daily.   Yes Historical Provider, MD  atorvastatin (LIPITOR) 40 MG tablet Take 1 tablet (40 mg total) by mouth daily. Patient taking differently: Take 40 mg by mouth at bedtime.  03/12/13  Yes Terance Ice, MD  benazepril-hydrochlorthiazide (LOTENSIN HCT) 10-12.5 MG tablet Take 1 tablet by mouth  daily 09/14/15  Yes Lorretta Harp, MD  Biotin 5000 MCG CAPS Take 5,000 mcg by mouth daily.    Yes Historical Provider, MD  cholecalciferol (VITAMIN D) 1000 UNITS tablet Take 1,000 Units by mouth daily.   Yes Historical Provider, MD  EVENING PRIMROSE OIL PO Take 1 tablet by mouth daily.   Yes Historical Provider, MD   hydrocortisone (ANUSOL-HC) 25 MG suppository Place 25 mg rectally 2 (two) times daily as needed for hemorrhoids or itching.  10/15/14  Yes Historical Provider, MD  Melatonin 3 MG CAPS Take 3 mg by mouth at bedtime as needed (Sleep).    Yes Historical Provider, MD  metoprolol tartrate (LOPRESSOR) 25 MG tablet Take 1 tablet by mouth two  times daily Patient taking differently: Take 25 mg by mouth twice daily 09/14/15  Yes Lorretta Harp, MD  Misc Natural Products (OSTEO BI-FLEX JOINT SHIELD) TABS Take 2 tablets by mouth daily.   Yes Historical Provider, MD  Multiple Minerals-Vitamins (CALCIUM CITRATE PLUS PO) Take 1 tablet by mouth 2 (two) times daily.   Yes Historical Provider, MD  Multiple Vitamin (MULTIVITAMIN WITH MINERALS) TABS tablet Take 1 tablet by mouth daily.   Yes Historical Provider, MD  Omega-3 Fatty Acids (FISH OIL) 1200 MG CPDR Take 1,200 mg by mouth daily.    Yes Historical Provider, MD  pantoprazole (PROTONIX) 40 MG tablet Take 1 tablet (40 mg total) by mouth daily. Patient taking differently: Take 40 mg by mouth at bedtime.  01/24/13  Yes Terance Ice, MD  tamoxifen (NOLVADEX) 20 MG tablet TAKE 1 TABLET BY MOUTH EVERY DAY Patient taking differently: TAKE 20 MG BY MOUTH EVERY DAY 09/30/14  Yes Nicholas Lose, MD  vitamin C (ASCORBIC ACID) 500 MG tablet Take 500 mg by mouth daily.   Yes Historical Provider, MD  cephALEXin (KEFLEX) 500 MG capsule Take 1 capsule (500 mg total) by mouth 2 (two) times daily. 12/03/15   Davonna Belling, MD  loperamide (IMODIUM) 2 MG capsule Take 1 capsule (2 mg total) by mouth 4 (four) times daily as needed for diarrhea or loose stools. 12/03/15   Davonna Belling, MD  ondansetron (ZOFRAN-ODT) 4 MG disintegrating tablet Take 1 tablet (4 mg total) by mouth every 8 (eight) hours as needed for nausea or vomiting. 12/03/15   Davonna Belling, MD   BP 112/54 mmHg  Pulse 79  Temp(Src) 99.3 F (37.4 C) (Oral)  Resp 16  SpO2 96% Physical Exam   Constitutional: She is oriented to person, place, and time. She appears well-developed and well-nourished.  HENT:  Head: Normocephalic and atraumatic.  Eyes: Pupils are equal, round, and reactive to light.  Neck: Normal range of motion.  Cardiovascular: Normal rate and regular rhythm.   Pulmonary/Chest: Effort normal. No respiratory distress.  Abdominal: Soft. Bowel sounds are normal. She exhibits no distension. There is no tenderness.  Musculoskeletal: Normal range of motion.  Neurological: She is alert and oriented to person, place, and time.  Skin: Skin is warm.  Psychiatric: She has a normal mood and affect. Her speech is normal.  Nursing note and vitals reviewed.   ED Course  Procedures (including critical care time) Labs Review Labs Reviewed  COMPREHENSIVE METABOLIC PANEL - Abnormal; Notable for the following:    Potassium 3.4 (*)    Glucose, Bld 122 (*)    Calcium 8.4 (*)    AST 59 (*)    ALT 61 (*)    Alkaline Phosphatase 36 (*)    All other components within normal limits  CBC WITH DIFFERENTIAL/PLATELET - Abnormal; Notable for the following:    Platelets 94 (*)    All other components within normal limits  URINALYSIS, ROUTINE W REFLEX MICROSCOPIC (NOT AT [facility]) - Abnormal; Notable for the following:    APPearance CLOUDY (*)    Hgb urine dipstick TRACE (*)    Ketones, ur 15 (*)    Leukocytes, UA TRACE (*)    All other components within normal limits  URINE MICROSCOPIC-ADD ON - Abnormal; Notable for the following:    Squamous Epithelial / LPF 0-5 (*)    Bacteria, UA MANY (*)    All other components within normal limits    Imaging Review No results found. I have personally reviewed and evaluated these images and lab results as part of my medical decision-making.   EKG Interpretation None      MDM   Final diagnoses:  Diarrhea, unspecified type  Acute cystitis without hematuria  Patient with diarrhea. Feels better after treatment. Lab work reassuring but  does have UTI. Will discharge home. Has tolerated orals.    Davonna Belling, MD 12/03/15 2351

## 2015-12-03 NOTE — ED Notes (Signed)
Bed: WA07 Expected date:  Expected time:  Means of arrival:  Comments: EMS, elderly, orthostatic changes

## 2015-12-03 NOTE — Discharge Instructions (Signed)
Diarrhea °Diarrhea is frequent loose and watery bowel movements. It can cause you to feel weak and dehydrated. Dehydration can cause you to become tired and thirsty, have a dry mouth, and have decreased urination that often is dark yellow. Diarrhea is a sign of another problem, most often an infection that will not last long. In most cases, diarrhea typically lasts 2-3 days. However, it can last longer if it is a sign of something more serious. It is important to treat your diarrhea as directed by your caregiver to lessen or prevent future episodes of diarrhea. °CAUSES  °Some common causes include: °· Gastrointestinal infections caused by viruses, bacteria, or parasites. °· Food poisoning or food allergies. °· Certain medicines, such as antibiotics, chemotherapy, and laxatives. °· Artificial sweeteners and fructose. °· Digestive disorders. °HOME CARE INSTRUCTIONS °· Ensure adequate fluid intake (hydration): Have 1 cup (8 oz) of fluid for each diarrhea episode. Avoid fluids that contain simple sugars or sports drinks, fruit juices, whole milk products, and sodas. Your urine should be clear or pale yellow if you are drinking enough fluids. Hydrate with an oral rehydration solution that you can purchase at pharmacies, retail stores, and online. You can prepare an oral rehydration solution at home by mixing the following ingredients together: °·  - tsp table salt. °· ¾ tsp baking soda. °·  tsp salt substitute containing potassium chloride. °· 1  tablespoons sugar. °· 1 L (34 oz) of water. °· Certain foods and beverages may increase the speed at which food moves through the gastrointestinal (GI) tract. These foods and beverages should be avoided and include: °· Caffeinated and alcoholic beverages. °· High-fiber foods, such as raw fruits and vegetables, nuts, seeds, and whole grain breads and cereals. °· Foods and beverages sweetened with sugar alcohols, such as xylitol, sorbitol, and mannitol. °· Some foods may be well  tolerated and may help thicken stool including: °· Starchy foods, such as rice, toast, pasta, low-sugar cereal, oatmeal, grits, baked potatoes, crackers, and bagels. °· Bananas. °· Applesauce. °· Add probiotic-rich foods to help increase healthy bacteria in the GI tract, such as yogurt and fermented milk products. °· Wash your hands well after each diarrhea episode. °· Only take over-the-counter or prescription medicines as directed by your caregiver. °· Take a warm bath to relieve any burning or pain from frequent diarrhea episodes. °SEEK IMMEDIATE MEDICAL CARE IF:  °· You are unable to keep fluids down. °· You have persistent vomiting. °· You have blood in your stool, or your stools are black and tarry. °· You do not urinate in 6-8 hours, or there is only a small amount of very dark urine. °· You have abdominal pain that increases or localizes. °· You have weakness, dizziness, confusion, or light-headedness. °· You have a severe headache. °· Your diarrhea gets worse or does not get better. °· You have a fever or persistent symptoms for more than 2-3 days. °· You have a fever and your symptoms suddenly get worse. °MAKE SURE YOU:  °· Understand these instructions. °· Will watch your condition. °· Will get help right away if you are not doing well or get worse. °  °This information is not intended to replace advice given to you by your health care provider. Make sure you discuss any questions you have with your health care provider. °  °Document Released: 08/12/2002 Document Revised: 09/12/2014 Document Reviewed: 04/29/2012 °Elsevier Interactive Patient Education ©2016 [employer] Inc[employer] ° °Urinary Tract Infection °Urinary tract infections (UTIs) can develop anywhere along   your urinary tract. Your urinary tract is your body's drainage system for removing wastes and extra water. Your urinary tract includes two kidneys, two ureters, a bladder, and a urethra. Your kidneys are a pair of bean-shaped organs. Each kidney is  about the size of your fist. They are located below your ribs, one on each side of your spine. °CAUSES °Infections are caused by microbes, which are microscopic organisms, including fungi, viruses, and bacteria. These organisms are so small that they can only be seen through a microscope. Bacteria are the microbes that most commonly cause UTIs. °SYMPTOMS  °Symptoms of UTIs may vary by age and gender of the patient and by the location of the infection. Symptoms in young women typically include a frequent and intense urge to urinate and a painful, burning feeling in the bladder or urethra during urination. Older women and men are more likely to be tired, shaky, and weak and have muscle aches and abdominal pain. A fever may mean the infection is in your kidneys. Other symptoms of a kidney infection include pain in your back or sides below the ribs, nausea, and vomiting. °DIAGNOSIS °To diagnose a UTI, your caregiver will ask you about your symptoms. Your caregiver will also ask you to provide a urine sample. The urine sample will be tested for bacteria and white blood cells. White blood cells are made by your body to help fight infection. °TREATMENT  °Typically, UTIs can be treated with medication. Because most UTIs are caused by a bacterial infection, they usually can be treated with the use of antibiotics. The choice of antibiotic and length of treatment depend on your symptoms and the type of bacteria causing your infection. °HOME CARE INSTRUCTIONS °· If you were prescribed antibiotics, take them exactly as your caregiver instructs you. Finish the medication even if you feel better after you have only taken some of the medication. °· Drink enough water and fluids to keep your urine clear or pale yellow. °· Avoid caffeine, tea, and carbonated beverages. They tend to irritate your bladder. °· Empty your bladder often. Avoid holding urine for long periods of time. °· Empty your bladder before and after sexual  intercourse. °· After a bowel movement, women should cleanse from front to back. Use each tissue only once. °SEEK MEDICAL CARE IF:  °· You have back pain. °· You develop a fever. °· Your symptoms do not begin to resolve within 3 days. °SEEK IMMEDIATE MEDICAL CARE IF:  °· You have severe back pain or lower abdominal pain. °· You develop chills. °· You have nausea or vomiting. °· You have continued burning or discomfort with urination. °MAKE SURE YOU:  °· Understand these instructions. °· Will watch your condition. °· Will get help right away if you are not doing well or get worse. °  °This information is not intended to replace advice given to you by your health care provider. Make sure you discuss any questions you have with your health care provider. °  °Document Released: 06/01/2005 Document Revised: 05/13/2015 Document Reviewed: 09/30/2011 °Elsevier Interactive Patient Education ©2016 Elsevier Inc. ° °

## 2015-12-03 NOTE — ED Notes (Signed)
Per EMS, patient has had diarrhea since Monday. Denies abdominal pain, nausea, or vomiting. Patient is from [facility] in an "apartment."

## 2015-12-06 ENCOUNTER — Other Ambulatory Visit: Payer: Self-pay | Admitting: Hematology and Oncology

## 2015-12-06 ENCOUNTER — Other Ambulatory Visit: Payer: Self-pay | Admitting: Cardiovascular Disease

## 2015-12-07 NOTE — Telephone Encounter (Signed)
Chart reviewed.

## 2015-12-08 NOTE — Telephone Encounter (Signed)
Rx(s) sent to pharmacy electronically.  

## 2015-12-15 DIAGNOSIS — R300 Dysuria: Secondary | ICD-10-CM | POA: Diagnosis not present

## 2015-12-15 DIAGNOSIS — N390 Urinary tract infection, site not specified: Secondary | ICD-10-CM | POA: Diagnosis not present

## 2015-12-23 ENCOUNTER — Encounter: Payer: Self-pay | Admitting: Cardiovascular Disease

## 2015-12-23 ENCOUNTER — Ambulatory Visit (INDEPENDENT_AMBULATORY_CARE_PROVIDER_SITE_OTHER): Payer: Medicare Other | Admitting: Cardiovascular Disease

## 2015-12-23 VITALS — BP 110/67 | HR 64 | Ht 64.0 in | Wt 147.6 lb

## 2015-12-23 DIAGNOSIS — I25119 Atherosclerotic heart disease of native coronary artery with unspecified angina pectoris: Secondary | ICD-10-CM

## 2015-12-23 DIAGNOSIS — E785 Hyperlipidemia, unspecified: Secondary | ICD-10-CM

## 2015-12-23 DIAGNOSIS — I10 Essential (primary) hypertension: Secondary | ICD-10-CM

## 2015-12-23 NOTE — Assessment & Plan Note (Signed)
History of hypertension blood pressure measures A1 10/67. She is on Lotensin, hydrochlorothiazide and metoprolol. Continue current meds at current dose

## 2015-12-23 NOTE — Assessment & Plan Note (Signed)
C history of coronary artery disease status post LAD/diagonal branch intervention by Dr. Rollene Fare March 2000. A subsequent Myoview stress test was nonischemic. She denies chest pain or shortness of breath.

## 2015-12-23 NOTE — Assessment & Plan Note (Signed)
History of hyperlipidemia on statin therapy followed by her PCP. 

## 2015-12-23 NOTE — Patient Instructions (Signed)

## 2015-12-23 NOTE — Progress Notes (Signed)
12/23/2015 Jasmin Holloway   [birth year]  [id number]  Primary Physician Jasmin Stack, MD Primary Cardiologist: Jasmin Harp MD Jasmin Holloway   HPI:  Jasmin Holloway is a delightful 82 year old thin appearing married Caucasian female mother of 2 children, grandmother to 2 grandchildren whose husband Jasmin Holloway is also a patient of mine. Last saw her in the office 12/17/14.The patient was formally a patient of of Dr. Terance Ice. She has a history of CAD status post LAD/diagonal branch intervention by Dr. Rollene Fare back in March of 2000. She has not had an procedure since. Her last Myoview performed in 2000 and was nonischemic. She is completely asymptomatic is very active. She and her husband have since [facility] into [facility] and used their exercise facility frequently.Her other problems include history of treated hypertension and hyperlipidemia.    Current Outpatient Prescriptions  Medication Sig Dispense Refill  . aspirin EC 81 MG tablet Take 81 mg by mouth daily.    Marland Kitchen atorvastatin (LIPITOR) 40 MG tablet Take 1 tablet (40 mg total) by mouth daily. (Patient taking differently: Take 40 mg by mouth at bedtime. ) 90 tablet 3  . benazepril-hydrochlorthiazide (LOTENSIN HCT) 10-12.5 MG tablet Take 1 tablet by mouth daily. PLEASE KEEP UPCOMING APPOINTMENT 90 tablet 0  . Biotin 5000 MCG CAPS Take 5,000 mcg by mouth daily.     . cephALEXin (KEFLEX) 500 MG capsule Take 1 capsule (500 mg total) by mouth 2 (two) times daily. 10 capsule 0  . cholecalciferol (VITAMIN D) 1000 UNITS tablet Take 1,000 Units by mouth daily.    Marland Kitchen EVENING PRIMROSE OIL PO Take 1 tablet by mouth daily.    . hydrocortisone (ANUSOL-HC) 25 MG suppository Place 25 mg rectally 2 (two) times daily as needed for hemorrhoids or itching.   7  . loperamide (IMODIUM) 2 MG capsule Take 1 capsule (2 mg total) by mouth 4 (four) times daily as needed for diarrhea or loose stools. 12 capsule 0  .  Melatonin 3 MG CAPS Take 3 mg by mouth at bedtime as needed (Sleep).     . metoprolol tartrate (LOPRESSOR) 25 MG tablet Take 1 tablet (25 mg total) by mouth 2 (two) times daily. PLEASE KEEP UPCOMING APPOINTMENT 180 tablet 0  . Misc Natural Products (OSTEO BI-FLEX JOINT SHIELD) TABS Take 2 tablets by mouth daily.    . Multiple Minerals-Vitamins (CALCIUM CITRATE PLUS PO) Take 1 tablet by mouth 2 (two) times daily.    . Multiple Vitamin (MULTIVITAMIN WITH MINERALS) TABS tablet Take 1 tablet by mouth daily.    . Omega-3 Fatty Acids (FISH OIL) 1200 MG CPDR Take 1,200 mg by mouth daily.     . ondansetron (ZOFRAN-ODT) 4 MG disintegrating tablet Take 1 tablet (4 mg total) by mouth every 8 (eight) hours as needed for nausea or vomiting. 10 tablet 0  . pantoprazole (PROTONIX) 40 MG tablet Take 1 tablet (40 mg total) by mouth daily. (Patient taking differently: Take 40 mg by mouth at bedtime. ) 30 tablet 1  . tamoxifen (NOLVADEX) 20 MG tablet Take 1 tablet by mouth  daily 90 tablet 3  . vitamin C (ASCORBIC ACID) 500 MG tablet Take 500 mg by mouth daily.     No current facility-administered medications for this visit.    Allergies  Allergen Reactions  . Codeine Nausea Only    Social History   Social History  . Marital Status: Married    Spouse Name: N/A  . Number of Children: N/A  .  Years of Education: N/A   Occupational History  . Not on file.   Social History Main Topics  . Smoking status: Never Smoker   . Smokeless tobacco: Never Used  . Alcohol Use: 1.8 oz/week    3 Standard drinks or equivalent per week     Comment: wine  . Drug Use: No  . Sexual Activity: Not on file   Other Topics Concern  . Not on file   Social History Narrative     Review of Systems: General: negative for chills, fever, night sweats or weight changes.  Cardiovascular: negative for chest pain, dyspnea on exertion, edema, orthopnea, palpitations, paroxysmal nocturnal dyspnea or shortness of  breath Dermatological: negative for rash Respiratory: negative for cough or wheezing Urologic: negative for hematuria Abdominal: negative for nausea, vomiting, diarrhea, bright red blood per rectum, melena, or hematemesis Neurologic: negative for visual changes, syncope, or dizziness All other systems reviewed and are otherwise negative except as noted above.    Blood pressure 110/67, pulse 64, height 5\' 4"  (1.626 m), weight 147 lb 9.6 oz (66.951 kg).  General appearance: alert and no distress Neck: no adenopathy, no carotid bruit, no JVD, supple, symmetrical, trachea midline and thyroid not enlarged, symmetric, no tenderness/mass/nodules Lungs: clear to auscultation bilaterally Heart: regular rate and rhythm, S1, S2 normal, no murmur, click, rub or gallop Extremities: extremities normal, atraumatic, no cyanosis or edema  EKG normal sinus rhythm at 64 left extubation. I personally reviewed this EKG  ASSESSMENT AND PLAN:   Coronary artery disease C history of coronary artery disease status post LAD/diagonal branch intervention by Dr. Rollene Fare March 2000. A subsequent Myoview stress test was nonischemic. She denies chest pain or shortness of breath.  Essential hypertension History of hypertension blood pressure measures A1 10/67. She is on Lotensin, hydrochlorothiazide and metoprolol. Continue current meds at current dose  Hyperlipidemia History of hyperlipidemia on statin therapy followed by her PCP      Jasmin Harp MD [facility], [facility] 12/23/2015 10:54 AM

## 2016-01-13 DIAGNOSIS — M859 Disorder of bone density and structure, unspecified: Secondary | ICD-10-CM | POA: Diagnosis not present

## 2016-01-13 DIAGNOSIS — I10 Essential (primary) hypertension: Secondary | ICD-10-CM | POA: Diagnosis not present

## 2016-01-13 DIAGNOSIS — R7309 Other abnormal glucose: Secondary | ICD-10-CM | POA: Diagnosis not present

## 2016-01-13 DIAGNOSIS — E784 Other hyperlipidemia: Secondary | ICD-10-CM | POA: Diagnosis not present

## 2016-01-13 DIAGNOSIS — N390 Urinary tract infection, site not specified: Secondary | ICD-10-CM | POA: Diagnosis not present

## 2016-01-13 DIAGNOSIS — R8299 Other abnormal findings in urine: Secondary | ICD-10-CM | POA: Diagnosis not present

## 2016-01-20 ENCOUNTER — Other Ambulatory Visit: Payer: Self-pay | Admitting: Endocrinology

## 2016-01-20 DIAGNOSIS — R7309 Other abnormal glucose: Secondary | ICD-10-CM | POA: Diagnosis not present

## 2016-01-20 DIAGNOSIS — M859 Disorder of bone density and structure, unspecified: Secondary | ICD-10-CM | POA: Diagnosis not present

## 2016-01-20 DIAGNOSIS — Z0000 Encounter for general adult medical examination without abnormal findings: Secondary | ICD-10-CM | POA: Diagnosis not present

## 2016-01-20 DIAGNOSIS — L940 Localized scleroderma [morphea]: Secondary | ICD-10-CM | POA: Diagnosis not present

## 2016-01-20 DIAGNOSIS — Z6825 Body mass index (BMI) 25.0-25.9, adult: Secondary | ICD-10-CM | POA: Diagnosis not present

## 2016-01-20 DIAGNOSIS — I779 Disorder of arteries and arterioles, unspecified: Secondary | ICD-10-CM | POA: Diagnosis not present

## 2016-01-20 DIAGNOSIS — N3000 Acute cystitis without hematuria: Secondary | ICD-10-CM

## 2016-01-20 DIAGNOSIS — D509 Iron deficiency anemia, unspecified: Secondary | ICD-10-CM

## 2016-01-20 DIAGNOSIS — D6489 Other specified anemias: Secondary | ICD-10-CM | POA: Diagnosis not present

## 2016-01-20 DIAGNOSIS — I2510 Atherosclerotic heart disease of native coronary artery without angina pectoris: Secondary | ICD-10-CM | POA: Diagnosis not present

## 2016-01-20 DIAGNOSIS — I10 Essential (primary) hypertension: Secondary | ICD-10-CM | POA: Diagnosis not present

## 2016-01-20 DIAGNOSIS — E784 Other hyperlipidemia: Secondary | ICD-10-CM | POA: Diagnosis not present

## 2016-01-20 DIAGNOSIS — Z1389 Encounter for screening for other disorder: Secondary | ICD-10-CM | POA: Diagnosis not present

## 2016-01-26 ENCOUNTER — Encounter: Payer: Self-pay | Admitting: Cardiovascular Disease

## 2016-01-27 ENCOUNTER — Inpatient Hospital Stay: Admission: RE | Admit: 2016-01-27 | Payer: Medicare Other | Source: Ambulatory Visit

## 2016-01-29 DIAGNOSIS — Z1212 Encounter for screening for malignant neoplasm of rectum: Secondary | ICD-10-CM | POA: Diagnosis not present

## 2016-02-02 ENCOUNTER — Other Ambulatory Visit: Payer: Medicare Other

## 2016-02-02 ENCOUNTER — Ambulatory Visit
Admission: RE | Admit: 2016-02-02 | Discharge: 2016-02-02 | Disposition: A | Discharge status: Home / Self Care | Payer: Medicare Other | Source: Ambulatory Visit | Attending: Endocrinology | Admitting: Endocrinology

## 2016-02-02 DIAGNOSIS — K449 Diaphragmatic hernia without obstruction or gangrene: Secondary | ICD-10-CM | POA: Diagnosis not present

## 2016-02-02 DIAGNOSIS — D509 Iron deficiency anemia, unspecified: Secondary | ICD-10-CM

## 2016-02-02 DIAGNOSIS — N3000 Acute cystitis without hematuria: Secondary | ICD-10-CM

## 2016-02-02 MED ORDER — IOPAMIDOL (ISOVUE-300) INJECTION 61%
100.0000 mL | Freq: Once | INTRAVENOUS | Status: AC | PRN
  Administered 2016-02-02: 100 mL via INTRAVENOUS

## 2016-02-03 ENCOUNTER — Other Ambulatory Visit: Payer: Medicare Other

## 2016-02-10 DIAGNOSIS — N390 Urinary tract infection, site not specified: Secondary | ICD-10-CM | POA: Diagnosis not present

## 2016-02-10 DIAGNOSIS — R8299 Other abnormal findings in urine: Secondary | ICD-10-CM | POA: Diagnosis not present

## 2016-02-17 DIAGNOSIS — Z6825 Body mass index (BMI) 25.0-25.9, adult: Secondary | ICD-10-CM | POA: Diagnosis not present

## 2016-02-17 DIAGNOSIS — D6489 Other specified anemias: Secondary | ICD-10-CM | POA: Diagnosis not present

## 2016-02-17 DIAGNOSIS — M7061 Trochanteric bursitis, right hip: Secondary | ICD-10-CM | POA: Diagnosis not present

## 2016-02-17 DIAGNOSIS — N3000 Acute cystitis without hematuria: Secondary | ICD-10-CM | POA: Diagnosis not present

## 2016-02-19 DIAGNOSIS — C50912 Malignant neoplasm of unspecified site of left female breast: Secondary | ICD-10-CM | POA: Diagnosis not present

## 2016-02-24 DIAGNOSIS — N390 Urinary tract infection, site not specified: Secondary | ICD-10-CM | POA: Diagnosis not present

## 2016-02-24 DIAGNOSIS — R8299 Other abnormal findings in urine: Secondary | ICD-10-CM | POA: Diagnosis not present

## 2016-02-25 DIAGNOSIS — M7061 Trochanteric bursitis, right hip: Secondary | ICD-10-CM | POA: Diagnosis not present

## 2016-03-09 DIAGNOSIS — M7061 Trochanteric bursitis, right hip: Secondary | ICD-10-CM | POA: Diagnosis not present

## 2016-03-14 ENCOUNTER — Other Ambulatory Visit: Payer: Self-pay | Admitting: Cardiovascular Disease

## 2016-03-14 NOTE — Telephone Encounter (Signed)
REFILL 

## 2016-04-14 DIAGNOSIS — N3020 Other chronic cystitis without hematuria: Secondary | ICD-10-CM | POA: Diagnosis not present

## 2016-04-14 DIAGNOSIS — R351 Nocturia: Secondary | ICD-10-CM | POA: Diagnosis not present

## 2016-04-14 DIAGNOSIS — R350 Frequency of micturition: Secondary | ICD-10-CM | POA: Diagnosis not present

## 2016-04-14 DIAGNOSIS — N3946 Mixed incontinence: Secondary | ICD-10-CM | POA: Diagnosis not present

## 2016-04-15 DIAGNOSIS — M7061 Trochanteric bursitis, right hip: Secondary | ICD-10-CM | POA: Diagnosis not present

## 2016-05-10 DIAGNOSIS — L814 Other melanin hyperpigmentation: Secondary | ICD-10-CM | POA: Diagnosis not present

## 2016-05-10 DIAGNOSIS — Z85828 Personal history of other malignant neoplasm of skin: Secondary | ICD-10-CM | POA: Diagnosis not present

## 2016-05-10 DIAGNOSIS — L821 Other seborrheic keratosis: Secondary | ICD-10-CM | POA: Diagnosis not present

## 2016-05-10 DIAGNOSIS — L940 Localized scleroderma [morphea]: Secondary | ICD-10-CM | POA: Diagnosis not present

## 2016-05-13 ENCOUNTER — Telehealth: Payer: Self-pay | Admitting: Hematology and Oncology

## 2016-05-13 DIAGNOSIS — N3020 Other chronic cystitis without hematuria: Secondary | ICD-10-CM | POA: Diagnosis not present

## 2016-05-13 DIAGNOSIS — N1339 Other hydronephrosis: Secondary | ICD-10-CM | POA: Diagnosis not present

## 2016-05-13 DIAGNOSIS — N3946 Mixed incontinence: Secondary | ICD-10-CM | POA: Diagnosis not present

## 2016-05-13 NOTE — Telephone Encounter (Signed)
Lvm to inform pt of r/s appt per [facility] due to upcoming weather condition

## 2016-05-16 ENCOUNTER — Telehealth: Payer: Self-pay | Admitting: Hematology and Oncology

## 2016-05-16 NOTE — Telephone Encounter (Signed)
05/26/2016 Appointment rescheduled to 05/27/2016 per patient request. Patient available the following day.

## 2016-05-17 ENCOUNTER — Ambulatory Visit: Payer: Medicare Other | Admitting: Hematology and Oncology

## 2016-05-20 DIAGNOSIS — M6281 Muscle weakness (generalized): Secondary | ICD-10-CM | POA: Diagnosis not present

## 2016-05-20 DIAGNOSIS — R351 Nocturia: Secondary | ICD-10-CM | POA: Diagnosis not present

## 2016-05-20 DIAGNOSIS — R278 Other lack of coordination: Secondary | ICD-10-CM | POA: Diagnosis not present

## 2016-05-20 DIAGNOSIS — N3946 Mixed incontinence: Secondary | ICD-10-CM | POA: Diagnosis not present

## 2016-05-24 ENCOUNTER — Ambulatory Visit: Payer: Medicare Other | Admitting: Hematology and Oncology

## 2016-05-26 ENCOUNTER — Ambulatory Visit: Payer: Medicare Other | Admitting: Hematology and Oncology

## 2016-05-26 NOTE — Assessment & Plan Note (Signed)
Left breast invasive ductal carcinoma status post lumpectomy 08/05/2014 0.4 cm tumor, T1 aN0 M0 stage IA ER 90% PR 90% HER-2 negative ratio 1.34, Ki-67 10%, started tamoxifen 08/22/2014  Tamoxifen toxicities: Tolerating tamoxifen extremely well without any major problems or concerns. Patient wakes up a few times in the night because she cannot stay asleep Hot flashes not been a major problem occasionally they do keep her awake.  Breast cancer surveillance: 1. Breast exam 05/26/2016 is normal 2. Mammograms 09/08/15: Normal density Cat C  Return to clinic in 1 year for follow-up

## 2016-05-27 ENCOUNTER — Encounter: Payer: Self-pay | Admitting: Hematology and Oncology

## 2016-05-27 ENCOUNTER — Ambulatory Visit (HOSPITAL_BASED_OUTPATIENT_CLINIC_OR_DEPARTMENT_OTHER): Payer: Medicare Other | Admitting: Hematology and Oncology

## 2016-05-27 DIAGNOSIS — Z79810 Long term (current) use of selective estrogen receptor modulators (SERMs): Secondary | ICD-10-CM

## 2016-05-27 DIAGNOSIS — C50212 Malignant neoplasm of upper-inner quadrant of left female breast: Secondary | ICD-10-CM

## 2016-05-27 NOTE — Progress Notes (Signed)
Patient Care Team: Reynold Bowen, MD as PCP - General (Endocrinology) Nicholas Lose, MD as Consulting Physician (Hematology and Oncology) Excell Seltzer, MD as Consulting Physician (General Surgery) Eppie Gibson, MD as Attending Physician (Radiation Oncology) Holley Bouche, NP as Nurse Practitioner (Nurse Practitioner)  DIAGNOSIS: Breast cancer of upper-inner quadrant of left female breast [facility])   Staging form: Breast, AJCC 7th Edition   - Clinical stage from 07/16/2014: Stage IA (T1b, N0, M0) - Unsigned         Staging comments: Staged at breast conference on 11.11.15   - Pathologic stage from 08/05/2014: Stage Unknown (T1a, NX, cM0) - Signed by Rulon Eisenmenger, MD on 08/14/2014         Staging comments: Staging from final excisional pathology by Dr. Lyndon Code.  SUMMARY OF ONCOLOGIC HISTORY:   Breast cancer of upper-inner quadrant of left female breast ([place])   07/01/2014 Breast MRI    Left breast 10:00 position 0.8 x 0.7 x 0.6 cm lobulated enhancing mass, liver cysts      07/07/2014 Initial Diagnosis    Left breast invasive ductal carcinoma with DCIS with calcifications, grade 2 with micropapillary features ER 90%, PR 90%, HER-2 negative ratio 1.34, Ki-67 10%      08/05/2014 Surgery    Left breast lumpectomy: 0.4 cm IDC, with DCIS, margins negative      09/05/2014 -  Anti-estrogen oral therapy    Tamoxifen 22m once daily for 5 years (will complete in 09/2019)       CHIEF COMPLIANT: Follow-up on tamoxifen  INTERVAL HISTORY: Jasmin HARRISSis a 82year old with above-mentioned history of left breast cancer treated with lumpectomy and is currently on antiestrogen therapy with tamoxifen. She is tolerating it extremely well. She does have occasional hot flashes. Recently she was diagnosed with bursitis and seeing orthopedic specialists. She was also diagnosed with frequent urinary infections and she is seeing a urologist. She denies any pain lumps or nodules in breast.  REVIEW  OF SYSTEMS:   Constitutional: Denies fevers, chills or abnormal weight loss Eyes: Denies blurriness of vision Ears, nose, mouth, throat, and face: Denies mucositis or sore throat Respiratory: Denies cough, dyspnea or wheezes Cardiovascular: Denies palpitation, chest discomfort Gastrointestinal:  Denies nausea, heartburn or change in bowel habits Skin: Denies abnormal skin rashes Lymphatics: Denies new lymphadenopathy or easy bruising Neurological:Denies numbness, tingling or new weaknesses Behavioral/Psych: Mood is stable, no new changes  Extremities: No lower extremity edema Breast:  denies any pain or lumps or nodules in either breasts All other systems were reviewed with the patient and are negative.  I have reviewed the past medical history, past surgical history, social history and family history with the patient and they are unchanged from previous note.  ALLERGIES:  is allergic to codeine.  MEDICATIONS:  Current Outpatient Prescriptions  Medication Sig Dispense Refill  . aspirin EC 81 MG tablet Take 81 mg by mouth daily.    .Marland Kitchenatorvastatin (LIPITOR) 40 MG tablet Take 1 tablet (40 mg total) by mouth daily. (Patient taking differently: Take 40 mg by mouth at bedtime. ) 90 tablet 3  . benazepril-hydrochlorthiazide (LOTENSIN HCT) 10-12.5 MG tablet Take 1 tablet by mouth daily. KEEP OV. 90 tablet 1  . Biotin 5000 MCG CAPS Take 5,000 mcg by mouth daily.     . cephALEXin (KEFLEX) 500 MG capsule Take 1 capsule (500 mg total) by mouth 2 (two) times daily. 10 capsule 0  . cholecalciferol (VITAMIN D) 1000 UNITS tablet Take 1,000 Units  by mouth daily.    Marland Kitchen EVENING PRIMROSE OIL PO Take 1 tablet by mouth daily.    . hydrocortisone (ANUSOL-HC) 25 MG suppository Place 25 mg rectally 2 (two) times daily as needed for hemorrhoids or itching.   7  . loperamide (IMODIUM) 2 MG capsule Take 1 capsule (2 mg total) by mouth 4 (four) times daily as needed for diarrhea or loose stools. 12 capsule 0  .  Melatonin 3 MG CAPS Take 3 mg by mouth at bedtime as needed (Sleep).     . metoprolol tartrate (LOPRESSOR) 25 MG tablet Take 1 tablet (25 mg total) by mouth 2 (two) times daily. KEEP OV. 180 tablet 1  . Misc Natural Products (OSTEO BI-FLEX JOINT SHIELD) TABS Take 2 tablets by mouth daily.    . Multiple Minerals-Vitamins (CALCIUM CITRATE PLUS PO) Take 1 tablet by mouth 2 (two) times daily.    . Multiple Vitamin (MULTIVITAMIN WITH MINERALS) TABS tablet Take 1 tablet by mouth daily.    . Omega-3 Fatty Acids (FISH OIL) 1200 MG CPDR Take 1,200 mg by mouth daily.     . ondansetron (ZOFRAN-ODT) 4 MG disintegrating tablet Take 1 tablet (4 mg total) by mouth every 8 (eight) hours as needed for nausea or vomiting. 10 tablet 0  . pantoprazole (PROTONIX) 40 MG tablet Take 1 tablet (40 mg total) by mouth daily. (Patient taking differently: Take 40 mg by mouth at bedtime. ) 30 tablet 1  . tamoxifen (NOLVADEX) 20 MG tablet Take 1 tablet by mouth  daily 90 tablet 3  . vitamin C (ASCORBIC ACID) 500 MG tablet Take 500 mg by mouth daily.     No current facility-administered medications for this visit.     PHYSICAL EXAMINATION: ECOG PERFORMANCE STATUS: 0 - Asymptomatic  Vitals:   05/27/16 0938  BP: 128/66  Pulse: 68  Resp: 18  Temp: 98 F (36.7 C)   Filed Weights   05/27/16 0938  Weight: 151 lb 12.8 oz (68.9 kg)    GENERAL:alert, no distress and comfortable SKIN: skin color, texture, turgor are normal, no rashes or significant lesions EYES: normal, Conjunctiva are pink and non-injected, sclera clear OROPHARYNX:no exudate, no erythema and lips, buccal mucosa, and tongue normal  NECK: supple, thyroid normal size, non-tender, without nodularity LYMPH:  no palpable lymphadenopathy in the cervical, axillary or inguinal LUNGS: clear to auscultation and percussion with normal breathing effort HEART: regular rate & rhythm and no murmurs and no lower extremity edema ABDOMEN:abdomen soft, non-tender and  normal bowel sounds MUSCULOSKELETAL:no cyanosis of digits and no clubbing  NEURO: alert & oriented x 3 with fluent speech, no focal motor/sensory deficits EXTREMITIES: No lower extremity edema BREAST: No palpable masses or nodules in either right or left breasts. No palpable axillary supraclavicular or infraclavicular adenopathy no breast tenderness or nipple discharge. (exam performed in the presence of a chaperone)  LABORATORY DATA:  I have reviewed the data as listed   Chemistry      Component Value Date/Time   NA 135 12/03/2015 1715   NA 143 07/16/2014 1242   K 3.4 (L) 12/03/2015 1715   K 3.9 07/16/2014 1242   CL 103 12/03/2015 1715   CO2 23 12/03/2015 1715   CO2 27 07/16/2014 1242   BUN 13 12/03/2015 1715   BUN 16.2 07/16/2014 1242   CREATININE 0.86 12/03/2015 1715   CREATININE 0.9 07/16/2014 1242      Component Value Date/Time   CALCIUM 8.4 (L) 12/03/2015 1715   CALCIUM 10.5 (H)  07/16/2014 1242   ALKPHOS 36 (L) 12/03/2015 1715   ALKPHOS 53 07/16/2014 1242   AST 59 (H) 12/03/2015 1715   AST 30 07/16/2014 1242   ALT 61 (H) 12/03/2015 1715   ALT 30 07/16/2014 1242   BILITOT 0.7 12/03/2015 1715   BILITOT 0.69 07/16/2014 1242       Lab Results  Component Value Date   WBC 6.8 12/03/2015   HGB 13.6 12/03/2015   HCT 38.8 12/03/2015   MCV 90.4 12/03/2015   PLT 94 (L) 12/03/2015   NEUTROABS 4.8 12/03/2015     ASSESSMENT & PLAN:  Breast cancer of upper-inner quadrant of left female breast Left breast invasive ductal carcinoma status post lumpectomy 08/05/2014 0.4 cm tumor, T1 aN0 M0 stage IA ER 90% PR 90% HER-2 negative ratio 1.34, Ki-67 10%, started tamoxifen 08/22/2014  Tamoxifen toxicities: Tolerating tamoxifen extremely well without any major problems or concerns. Patient wakes up a few times in the night because she cannot stay asleep Hot flashes not been a major problem occasionally they do keep her awake.  Breast cancer surveillance: 1. Breast exam  05/26/2016 is normal 2. Mammograms 09/08/15: Normal density Cat C  Return to clinic in 1 year for follow-up   No orders of the defined types were placed in this encounter.  The patient has a good understanding of the overall plan. she agrees with it. she will call with any problems that may develop before the next visit here.   Rulon Eisenmenger, MD 05/27/16

## 2016-05-30 DIAGNOSIS — M6281 Muscle weakness (generalized): Secondary | ICD-10-CM | POA: Diagnosis not present

## 2016-05-30 DIAGNOSIS — R278 Other lack of coordination: Secondary | ICD-10-CM | POA: Diagnosis not present

## 2016-05-30 DIAGNOSIS — R351 Nocturia: Secondary | ICD-10-CM | POA: Diagnosis not present

## 2016-05-30 DIAGNOSIS — R350 Frequency of micturition: Secondary | ICD-10-CM | POA: Diagnosis not present

## 2016-05-30 DIAGNOSIS — N3946 Mixed incontinence: Secondary | ICD-10-CM | POA: Diagnosis not present

## 2016-06-01 DIAGNOSIS — M7061 Trochanteric bursitis, right hip: Secondary | ICD-10-CM | POA: Diagnosis not present

## 2016-06-07 DIAGNOSIS — M6281 Muscle weakness (generalized): Secondary | ICD-10-CM | POA: Diagnosis not present

## 2016-06-07 DIAGNOSIS — N3946 Mixed incontinence: Secondary | ICD-10-CM | POA: Diagnosis not present

## 2016-06-07 DIAGNOSIS — R278 Other lack of coordination: Secondary | ICD-10-CM | POA: Diagnosis not present

## 2016-06-07 DIAGNOSIS — M62838 Other muscle spasm: Secondary | ICD-10-CM | POA: Diagnosis not present

## 2016-06-07 DIAGNOSIS — R350 Frequency of micturition: Secondary | ICD-10-CM | POA: Diagnosis not present

## 2016-06-10 ENCOUNTER — Other Ambulatory Visit: Payer: Self-pay | Admitting: Cardiovascular Disease

## 2016-06-10 DIAGNOSIS — Z23 Encounter for immunization: Secondary | ICD-10-CM | POA: Diagnosis not present

## 2016-06-28 DIAGNOSIS — M62838 Other muscle spasm: Secondary | ICD-10-CM | POA: Diagnosis not present

## 2016-06-28 DIAGNOSIS — N3946 Mixed incontinence: Secondary | ICD-10-CM | POA: Diagnosis not present

## 2016-06-28 DIAGNOSIS — M6281 Muscle weakness (generalized): Secondary | ICD-10-CM | POA: Diagnosis not present

## 2016-06-28 DIAGNOSIS — R278 Other lack of coordination: Secondary | ICD-10-CM | POA: Diagnosis not present

## 2016-07-05 ENCOUNTER — Encounter (HOSPITAL_COMMUNITY): Payer: Self-pay | Admitting: Emergency Medicine

## 2016-07-05 ENCOUNTER — Emergency Department (HOSPITAL_COMMUNITY)
Admission: EM | Admit: 2016-07-05 | Discharge: 2016-07-06 | Disposition: A | Discharge status: Home / Self Care | Payer: Medicare Other | Attending: Emergency Medicine | Admitting: Emergency Medicine

## 2016-07-05 DIAGNOSIS — N3946 Mixed incontinence: Secondary | ICD-10-CM | POA: Diagnosis not present

## 2016-07-05 DIAGNOSIS — M62830 Muscle spasm of back: Secondary | ICD-10-CM | POA: Insufficient documentation

## 2016-07-05 DIAGNOSIS — I10 Essential (primary) hypertension: Secondary | ICD-10-CM | POA: Diagnosis not present

## 2016-07-05 DIAGNOSIS — M62838 Other muscle spasm: Secondary | ICD-10-CM | POA: Diagnosis not present

## 2016-07-05 DIAGNOSIS — I2510 Atherosclerotic heart disease of native coronary artery without angina pectoris: Secondary | ICD-10-CM | POA: Diagnosis not present

## 2016-07-05 DIAGNOSIS — Z7982 Long term (current) use of aspirin: Secondary | ICD-10-CM | POA: Insufficient documentation

## 2016-07-05 DIAGNOSIS — Z79899 Other long term (current) drug therapy: Secondary | ICD-10-CM | POA: Insufficient documentation

## 2016-07-05 DIAGNOSIS — M6281 Muscle weakness (generalized): Secondary | ICD-10-CM | POA: Diagnosis not present

## 2016-07-05 DIAGNOSIS — M546 Pain in thoracic spine: Secondary | ICD-10-CM | POA: Diagnosis not present

## 2016-07-05 DIAGNOSIS — M545 Low back pain, unspecified: Secondary | ICD-10-CM

## 2016-07-05 DIAGNOSIS — M5489 Other dorsalgia: Secondary | ICD-10-CM | POA: Diagnosis not present

## 2016-07-05 DIAGNOSIS — R278 Other lack of coordination: Secondary | ICD-10-CM | POA: Diagnosis not present

## 2016-07-05 NOTE — ED Triage Notes (Signed)
Patient BIB [facility] from home c/o back spasms since this morning. Patient had massage which caused spasms to worsen throughout the day. Patient took 3 Asprin around 2030, which helped a little. Patient went to bed and pain became unbearable again.  Pain is located to right of thoracic spine, spasms lasting several seconds- occurring about every 30 seconds.

## 2016-07-05 NOTE — ED Notes (Signed)
Bed: WA04 Expected date:  Expected time:  Means of arrival:  Comments: EMS 82 yo female from [place]-back spasms-had a massage and it irritated it more-took 3 aspirins with minimal relief

## 2016-07-06 ENCOUNTER — Emergency Department (HOSPITAL_COMMUNITY): Payer: Medicare Other

## 2016-07-06 DIAGNOSIS — M62830 Muscle spasm of back: Secondary | ICD-10-CM | POA: Diagnosis not present

## 2016-07-06 DIAGNOSIS — M545 Low back pain: Secondary | ICD-10-CM | POA: Diagnosis not present

## 2016-07-06 DIAGNOSIS — M546 Pain in thoracic spine: Secondary | ICD-10-CM | POA: Diagnosis not present

## 2016-07-06 LAB — I-STAT CHEM 8, ED
BUN: 17 mg/dL (ref 6–20)
CHLORIDE: 107 mmol/L (ref 101–111)
Calcium, Ion: 1.13 mmol/L — ABNORMAL LOW (ref 1.15–1.40)
Creatinine, Ser: 0.9 mg/dL (ref 0.44–1.00)
GLUCOSE: 115 mg/dL — AB (ref 65–99)
HCT: 41 % (ref 36.0–46.0)
HEMOGLOBIN: 13.9 g/dL (ref 12.0–15.0)
POTASSIUM: 4 mmol/L (ref 3.5–5.1)
SODIUM: 142 mmol/L (ref 135–145)
TCO2: 25 mmol/L (ref 0–100)

## 2016-07-06 MED ORDER — NAPROXEN 375 MG PO TABS
375.0000 mg | ORAL_TABLET | Freq: Two times a day (BID) | ORAL | 0 refills | Status: AC
Start: 2016-07-06 — End: 2016-07-13

## 2016-07-06 MED ORDER — SODIUM CHLORIDE 0.9 % IV BOLUS (SEPSIS)
1000.0000 mL | Freq: Once | INTRAVENOUS | Status: AC
  Administered 2016-07-06: 1000 mL via INTRAVENOUS

## 2016-07-06 MED ORDER — OMEPRAZOLE 20 MG PO CPDR: 20.0000 mg | DELAYED_RELEASE_CAPSULE | Freq: Every day | ORAL | 0 refills | Status: DC

## 2016-07-06 MED ORDER — DIAZEPAM 5 MG/ML IJ SOLN
2.5000 mg | Freq: Once | INTRAMUSCULAR | Status: AC
  Administered 2016-07-06: 2.5 mg via INTRAVENOUS
  Filled 2016-07-06: qty 2

## 2016-07-06 MED ORDER — KETOROLAC TROMETHAMINE 15 MG/ML IJ SOLN
15.0000 mg | Freq: Once | INTRAMUSCULAR | Status: AC
  Administered 2016-07-06: 15 mg via INTRAVENOUS
  Filled 2016-07-06: qty 1

## 2016-07-06 MED ORDER — DIAZEPAM 2 MG PO TABS: 2.0000 mg | ORAL_TABLET | Freq: Three times a day (TID) | ORAL | 0 refills | Status: DC | PRN

## 2016-07-06 NOTE — ED Provider Notes (Signed)
[name]   CSN: [id number] Arrival date & time: 07/05/16  2332   By signing my name below, I, Neta Mends, attest that this documentation has been prepared under the direction and in the presence of Duffy Bruce, MD . Electronically Signed: Neta Mends, ED Scribe. 07/06/2016. 12:32 AM.   History   Chief Complaint Chief Complaint  Patient presents with  . Back Pain    The history is provided by the patient. No language interpreter was used.   HPI Comments:  Jasmin Holloway is a 82 y.o. female with PMHx of arthritis and hip bursitis who presents to the Emergency Department complaining of constant right-sided back pain that began today. Pt states that she has had back pain intermittently for "a long time," but the pain became worse today. Pt reports that she went to physical therapy this morning and states that the therapist was working on her back and she has been having pain ever since. Pt describes the pain as "cramping" and reports intermittent spasms. Pt reports that she frequently has bladder incontinence, but necessarily associated with her current back pain. Pt states that 2 years ago she fell and broke a rib on the right side. Pt has taken aspirin today with no relief. Pt denies any similar previous spasms. Pt denies numbness, weakness.   Past Medical History:  Diagnosis Date  . Aortic sclerosis    03/29/13 echo  . Arthritis    HANDS  . Breast cancer ([place])   . Coronary artery disease    DR. BERRY IS PT'S CARDIOLOGIST  . GERD (gastroesophageal reflux disease)   . Hyperlipidemia   . Hypertension   . Mitral regurgitation    mild 03/29/2013 echo  . PONV (postoperative nausea and vomiting)    PT HAD SEVERE NAUSEA WAKING UP IN RR AFTER BREAST LUMPECTOMY    Patient Active Problem List   Diagnosis Date Noted  . Osteopenia 09/09/2014  . Breast cancer of upper-inner quadrant of left female breast ([place]) 07/14/2014  . Angina at rest,  chest pain, arm pain 05/23/2014  . Coronary artery disease 11/08/2013  . Essential hypertension 11/08/2013  . Hyperlipidemia 11/08/2013    Past Surgical History:  Procedure Laterality Date  . APPENDECTOMY    . BREAST LUMPECTOMY WITH RADIOACTIVE SEED LOCALIZATION Left 08/05/2014   Procedure: SEED LOCALIZED LEFT BREAST LUMPECTOMY;  Surgeon: Excell Seltzer, MD;  Location: [facility];  Service: General;  Laterality: Left;  . COLONOSCOPY    . CORONARY ANGIOPLASTY WITH STENT PLACEMENT  11/16/1998   stent to the LAD and PTCA side branch diagonal  . DILATION AND CURETTAGE OF UTERUS    . EYE SURGERY     both cataracts  . NM MYOCAR PERF WALL MOTION  07/13/2009  . RE-EXCISION OF BREAST LUMPECTOMY Left 08/18/2014   Procedure: RE-EXCISION OF LEFT BREAST LUMPECTOMY;  Surgeon: Excell Seltzer, MD;  Location: [facility];  Service: General;  Laterality: Left;  . TONSILLECTOMY      OB History    No data available       Home Medications    Prior to Admission medications   Medication Sig Start Date End Date Taking? Authorizing Provider  aspirin EC 81 MG tablet Take 81 mg by mouth daily.   Yes Historical Provider, MD  atorvastatin (LIPITOR) 40 MG tablet Take 1 tablet (40 mg total) by mouth daily. Patient taking differently: Take 40 mg by mouth at bedtime.  03/12/13  Yes Terance Ice, MD  benazepril-hydrochlorthiazide (LOTENSIN HCT) 10-12.5 MG tablet Take 1 tablet by mouth daily. KEEP OV. 03/14/16  Yes Lorretta Harp, MD  Biotin 5000 MCG CAPS Take 5,000 mcg by mouth daily.    Yes Historical Provider, MD  cholecalciferol (VITAMIN D) 1000 UNITS tablet Take 1,000 Units by mouth daily.   Yes Historical Provider, MD  EVENING PRIMROSE OIL PO Take 1,300 mg by mouth daily.    Yes Historical Provider, MD  hydrocortisone (ANUSOL-HC) 25 MG suppository Place 25 mg rectally 2 (two) times daily as needed for hemorrhoids or itching.  10/15/14  Yes Historical Provider, MD  loperamide (IMODIUM) 2  MG capsule Take 1 capsule (2 mg total) by mouth 4 (four) times daily as needed for diarrhea or loose stools. 12/03/15  Yes Davonna Belling, MD  Melatonin 3 MG CAPS Take 3 mg by mouth at bedtime as needed (Sleep).    Yes Historical Provider, MD  metoprolol tartrate (LOPRESSOR) 25 MG tablet Take 1 tablet (25 mg total) by mouth 2 (two) times daily. KEEP OV. 03/14/16  Yes Lorretta Harp, MD  Misc Natural Products (OSTEO BI-FLEX JOINT SHIELD) TABS Take 2 tablets by mouth daily.   Yes Historical Provider, MD  Multiple Minerals-Vitamins (CALCIUM CITRATE PLUS PO) Take 1 tablet by mouth 2 (two) times daily.   Yes Historical Provider, MD  Multiple Vitamin (MULTIVITAMIN WITH MINERALS) TABS tablet Take 1 tablet by mouth daily.   Yes Historical Provider, MD  naproxen sodium (ANAPROX) 220 MG tablet Take 440 mg by mouth daily.   Yes Historical Provider, MD  Omega-3 Fatty Acids (FISH OIL) 1200 MG CPDR Take 1,200 mg by mouth daily.    Yes Historical Provider, MD  pantoprazole (PROTONIX) 40 MG tablet Take 1 tablet (40 mg total) by mouth daily. Patient taking differently: Take 40 mg by mouth at bedtime.  01/24/13  Yes Terance Ice, MD  tamoxifen (NOLVADEX) 20 MG tablet Take 1 tablet by mouth  daily 12/07/15  Yes Nicholas Lose, MD  vitamin C (ASCORBIC ACID) 500 MG tablet Take 500 mg by mouth daily.   Yes Historical Provider, MD  cephALEXin (KEFLEX) 500 MG capsule Take 1 capsule (500 mg total) by mouth 2 (two) times daily. Patient not taking: Reported on 07/06/2016 12/03/15   Davonna Belling, MD  diazepam (VALIUM) 2 MG tablet Take 1 tablet (2 mg total) by mouth every 8 (eight) hours as needed for muscle spasms. 07/06/16   Duffy Bruce, MD  naproxen (NAPROSYN) 375 MG tablet Take 1 tablet (375 mg total) by mouth 2 (two) times daily. 07/06/16 07/13/16  Duffy Bruce, MD  omeprazole (PRILOSEC) 20 MG capsule Take 1 capsule (20 mg total) by mouth daily. 07/06/16 07/13/16  Duffy Bruce, MD  ondansetron (ZOFRAN-ODT) 4 MG  disintegrating tablet Take 1 tablet (4 mg total) by mouth every 8 (eight) hours as needed for nausea or vomiting. Patient not taking: Reported on 07/06/2016 12/03/15   Davonna Belling, MD    Family History Family History  Problem Relation Age of Onset  . Cancer Mother   . Heart attack Father     Social History Social History  Substance Use Topics  . Smoking status: Never Smoker  . Smokeless tobacco: Never Used  . Alcohol use 1.8 oz/week    3 Standard drinks or equivalent per week     Comment: wine     Allergies   Codeine   Review of Systems Review of Systems  Constitutional: Negative for chills, fatigue and fever.  HENT: Negative for congestion  and rhinorrhea.   Eyes: Negative for visual disturbance.  Respiratory: Negative for cough, shortness of breath and wheezing.   Cardiovascular: Negative for chest pain and leg swelling.  Gastrointestinal: Negative for abdominal pain, diarrhea, nausea and vomiting.  Genitourinary: Negative for dysuria and flank pain.       Positive for bladder incontinence.   Musculoskeletal: Positive for back pain. Negative for neck pain and neck stiffness.  Skin: Negative for rash and wound.  Allergic/Immunologic: Negative for immunocompromised state.  Neurological: Negative for syncope, weakness, numbness and headaches.  All other systems reviewed and are negative.    Physical Exam Updated Vital Signs BP 146/74 (BP Location: Left Arm)   Pulse 82   Temp 97.8 F (36.6 C) (Oral)   Resp 16   SpO2 95%   Physical Exam  Constitutional: She is oriented to person, place, and time. She appears well-developed and well-nourished. No distress.  HENT:  Head: Normocephalic and atraumatic.  Eyes: Conjunctivae are normal.  Neck: Neck supple.  Cardiovascular: Normal rate, regular rhythm and normal heart sounds.  Exam reveals no friction rub.   No murmur heard. Pulmonary/Chest: Effort normal and breath sounds normal. No respiratory distress. She has  no wheezes. She has no rales.  Abdominal: Soft. Bowel sounds are normal. She exhibits no distension.  Musculoskeletal: She exhibits no edema.  Neurological: She is alert and oriented to person, place, and time. She exhibits normal muscle tone.  Skin: Skin is warm. Capillary refill takes less than 2 seconds.  Psychiatric: She has a normal mood and affect.  Nursing note and vitals reviewed.   Spine Exam: Inspection/Palpation: Marked right paraspinal TTP with palpable spasm. No midline TTP or deformity. No bruising. Strength: 5/5 throughout LE bilaterally (hip flexion/extension, adduction/abduction; knee flexion/extension; foot dorsiflexion/plantarflexion, inversion/eversion; great toe inversion) Sensation: Intact to light touch in proximal and distal LE bilaterally Reflexes: 2+ quadriceps and achilles reflexes   ED Treatments / Results  DIAGNOSTIC STUDIES:  Oxygen Saturation is 100% on RA, normal by my interpretation.    COORDINATION OF CARE:  12:32 AM Discussed treatment plan with pt at bedside and pt agreed to plan.   Labs (all labs ordered are listed, but only abnormal results are displayed) Labs Reviewed  I-STAT CHEM 8, ED - Abnormal; Notable for the following:       Result Value   Glucose, Bld 115 (*)    Calcium, Ion 1.13 (*)    All other components within normal limits    EKG  EKG Interpretation None       Radiology Dg Thoracic Spine 2 View  Result Date: 07/06/2016 CLINICAL DATA:  Worsening upper back pain over the past month. Initial encounter. EXAM: THORACIC SPINE 2 VIEWS COMPARISON:  None. FINDINGS: There is no evidence of fracture or subluxation. Vertebral bodies demonstrate normal height and alignment. Intervertebral disc spaces are preserved. The visualized portions of both lungs are clear. The mediastinum is unremarkable in appearance. IMPRESSION: No evidence of fracture or subluxation along the thoracic spine. Electronically Signed   By: Garald Balding M.D.    On: 07/06/2016 01:36   Dg Lumbar Spine Complete  Result Date: 07/06/2016 CLINICAL DATA:  Chronic worsening lower back pain. Initial encounter. EXAM: LUMBAR SPINE - COMPLETE 4+ VIEW COMPARISON:  None. FINDINGS: There is no evidence of fracture or subluxation. Vertebral bodies demonstrate normal height and alignment. Intervertebral disc spaces are preserved. Small lateral osteophytes are noted along the lumbar spine. The visualized bowel gas pattern is unremarkable in appearance; air and  stool are noted within the colon. The sacroiliac joints are within normal limits. IMPRESSION: No evidence of fracture or subluxation along the lumbar spine. Electronically Signed   By: Garald Balding M.D.   On: 07/06/2016 01:37    Procedures Procedures (including critical care time)  Medications Ordered in ED Medications  sodium chloride 0.9 % bolus 1,000 mL (0 mLs Intravenous Stopped 07/06/16 0323)  diazepam (VALIUM) injection 2.5 mg (2.5 mg Intravenous Given 07/06/16 0050)  ketorolac (TORADOL) 15 MG/ML injection 15 mg (15 mg Intravenous Given 07/06/16 0049)     Initial Impression / Assessment and Plan / ED Course  I have reviewed the triage vital signs and the nursing notes.  Pertinent labs & imaging results that were available during my care of the patient were reviewed by me and considered in my medical decision making (see chart for details).  Clinical Course    82 yo F with PMHx as above who p/w significant right paraspinal back pain after PT session today. No falls or trauma. On arrival, VSS and WNL. Exam is as above. Pt is overall very well-appearing and in NAD. LE strength and sensation is fully intact. istat chem shows no significant electrolyte abnormalities. Primary suspicion is acute muscle spasm in setting of likely strain 2/2 increased PT. Plain flms show no bony pathology. Pt has no red flags - no LE weakness, numbness, loss of bowel/bladder function, saddle anesthesia, or other signs to suggest  acute cord compression, cauda equina, or radiculopathy. Pt o/w well appearing without fevers or signs of infectious process. After valium, pt ambulatory in ED without difficulty.   Discussed risks/benefits of valium with pt and husband in detail. Suspect MSK sprain and spasms. Based on shared decision making, will treat with NSaiDs, valium PRN, and outpt f/u. Pt in agreement. Will d/ chome.  Final Clinical Impressions(s) / ED Diagnoses   Final diagnoses:  Back spasm  Acute right-sided low back pain without sciatica    New Prescriptions Discharge Medication List as of 07/06/2016  3:01 AM    START taking these medications   Details  diazepam (VALIUM) 2 MG tablet Take 1 tablet (2 mg total) by mouth every 8 (eight) hours as needed for muscle spasms., Starting Wed 07/06/2016, Print    naproxen (NAPROSYN) 375 MG tablet Take 1 tablet (375 mg total) by mouth 2 (two) times daily., Starting Wed 07/06/2016, Until Wed 07/13/2016, Print    omeprazole (PRILOSEC) 20 MG capsule Take 1 capsule (20 mg total) by mouth daily., Starting Wed 07/06/2016, Until Wed 07/13/2016, Print        I personally performed the services described in this documentation, which was scribed in my presence. The recorded information has been reviewed and is accurate.     Duffy Bruce, MD 07/06/16 1100

## 2016-07-07 DIAGNOSIS — M7061 Trochanteric bursitis, right hip: Secondary | ICD-10-CM | POA: Diagnosis not present

## 2016-07-07 DIAGNOSIS — I10 Essential (primary) hypertension: Secondary | ICD-10-CM | POA: Diagnosis not present

## 2016-07-07 DIAGNOSIS — K5900 Constipation, unspecified: Secondary | ICD-10-CM | POA: Diagnosis not present

## 2016-07-07 DIAGNOSIS — M62830 Muscle spasm of back: Secondary | ICD-10-CM | POA: Diagnosis not present

## 2016-07-07 DIAGNOSIS — Z6825 Body mass index (BMI) 25.0-25.9, adult: Secondary | ICD-10-CM | POA: Diagnosis not present

## 2016-07-07 DIAGNOSIS — R8299 Other abnormal findings in urine: Secondary | ICD-10-CM | POA: Diagnosis not present

## 2016-07-19 DIAGNOSIS — Z6825 Body mass index (BMI) 25.0-25.9, adult: Secondary | ICD-10-CM | POA: Diagnosis not present

## 2016-07-19 DIAGNOSIS — E784 Other hyperlipidemia: Secondary | ICD-10-CM | POA: Diagnosis not present

## 2016-07-19 DIAGNOSIS — M546 Pain in thoracic spine: Secondary | ICD-10-CM | POA: Diagnosis not present

## 2016-07-19 DIAGNOSIS — R7309 Other abnormal glucose: Secondary | ICD-10-CM | POA: Diagnosis not present

## 2016-07-19 DIAGNOSIS — Z9861 Coronary angioplasty status: Secondary | ICD-10-CM | POA: Diagnosis not present

## 2016-07-19 DIAGNOSIS — I10 Essential (primary) hypertension: Secondary | ICD-10-CM | POA: Diagnosis not present

## 2016-08-01 DIAGNOSIS — M546 Pain in thoracic spine: Secondary | ICD-10-CM | POA: Diagnosis not present

## 2016-08-08 DIAGNOSIS — M546 Pain in thoracic spine: Secondary | ICD-10-CM | POA: Diagnosis not present

## 2016-08-15 DIAGNOSIS — S22070D Wedge compression fracture of T9-T10 vertebra, subsequent encounter for fracture with routine healing: Secondary | ICD-10-CM | POA: Diagnosis not present

## 2016-08-15 DIAGNOSIS — M546 Pain in thoracic spine: Secondary | ICD-10-CM | POA: Diagnosis not present

## 2016-08-17 ENCOUNTER — Other Ambulatory Visit: Payer: Self-pay | Admitting: Endocrinology

## 2016-08-17 DIAGNOSIS — Z853 Personal history of malignant neoplasm of breast: Secondary | ICD-10-CM

## 2016-09-07 ENCOUNTER — Encounter (HOSPITAL_COMMUNITY): Payer: Self-pay

## 2016-09-07 ENCOUNTER — Observation Stay (HOSPITAL_COMMUNITY)
Admission: EM | Admit: 2016-09-07 | Discharge: 2016-09-08 | Disposition: A | Discharge status: Home / Self Care | Payer: Medicare Other | Attending: Internal Medicine | Admitting: Internal Medicine

## 2016-09-07 DIAGNOSIS — Z955 Presence of coronary angioplasty implant and graft: Secondary | ICD-10-CM | POA: Diagnosis not present

## 2016-09-07 DIAGNOSIS — Z79899 Other long term (current) drug therapy: Secondary | ICD-10-CM | POA: Diagnosis not present

## 2016-09-07 DIAGNOSIS — C50212 Malignant neoplasm of upper-inner quadrant of left female breast: Secondary | ICD-10-CM | POA: Diagnosis not present

## 2016-09-07 DIAGNOSIS — I10 Essential (primary) hypertension: Secondary | ICD-10-CM | POA: Diagnosis not present

## 2016-09-07 DIAGNOSIS — K921 Melena: Secondary | ICD-10-CM | POA: Diagnosis not present

## 2016-09-07 DIAGNOSIS — K922 Gastrointestinal hemorrhage, unspecified: Secondary | ICD-10-CM | POA: Diagnosis not present

## 2016-09-07 DIAGNOSIS — K625 Hemorrhage of anus and rectum: Secondary | ICD-10-CM

## 2016-09-07 DIAGNOSIS — Z170 Estrogen receptor positive status [ER+]: Secondary | ICD-10-CM

## 2016-09-07 DIAGNOSIS — R710 Precipitous drop in hematocrit: Secondary | ICD-10-CM | POA: Diagnosis not present

## 2016-09-07 DIAGNOSIS — E785 Hyperlipidemia, unspecified: Secondary | ICD-10-CM | POA: Diagnosis present

## 2016-09-07 DIAGNOSIS — Z7982 Long term (current) use of aspirin: Secondary | ICD-10-CM | POA: Insufficient documentation

## 2016-09-07 DIAGNOSIS — I2510 Atherosclerotic heart disease of native coronary artery without angina pectoris: Secondary | ICD-10-CM | POA: Diagnosis not present

## 2016-09-07 DIAGNOSIS — Z853 Personal history of malignant neoplasm of breast: Secondary | ICD-10-CM | POA: Insufficient documentation

## 2016-09-07 DIAGNOSIS — K649 Unspecified hemorrhoids: Secondary | ICD-10-CM

## 2016-09-07 HISTORY — DX: Personal history of (healed) traumatic fracture: Z87.81

## 2016-09-07 LAB — CBC WITH DIFFERENTIAL/PLATELET
Basophils Absolute: 0 10*3/uL (ref 0.0–0.1)
Basophils Relative: 0 %
Eosinophils Absolute: 0.1 10*3/uL (ref 0.0–0.7)
Eosinophils Relative: 1 %
HCT: 35.8 % — ABNORMAL LOW (ref 36.0–46.0)
HEMOGLOBIN: 11.9 g/dL — AB (ref 12.0–15.0)
LYMPHS ABS: 2.6 10*3/uL (ref 0.7–4.0)
LYMPHS PCT: 36 %
MCH: 31.5 pg (ref 26.0–34.0)
MCHC: 33.2 g/dL (ref 30.0–36.0)
MCV: 94.7 fL (ref 78.0–100.0)
Monocytes Absolute: 0.7 10*3/uL (ref 0.1–1.0)
Monocytes Relative: 10 %
NEUTROS PCT: 53 %
Neutro Abs: 3.8 10*3/uL (ref 1.7–7.7)
PLATELETS: 154 10*3/uL (ref 150–400)
RBC: 3.78 MIL/uL — AB (ref 3.87–5.11)
RDW: 14.4 % (ref 11.5–15.5)
WBC: 7.1 10*3/uL (ref 4.0–10.5)

## 2016-09-07 LAB — CBC
HCT: 35.8 % — ABNORMAL LOW (ref 36.0–46.0)
HEMATOCRIT: 36.6 % (ref 36.0–46.0)
HEMOGLOBIN: 12.4 g/dL (ref 12.0–15.0)
HEMOGLOBIN: 12 g/dL (ref 12.0–15.0)
MCH: 32 pg (ref 26.0–34.0)
MCH: 31 pg (ref 26.0–34.0)
MCHC: 33.9 g/dL (ref 30.0–36.0)
MCHC: 33.5 g/dL (ref 30.0–36.0)
MCV: 94.3 fL (ref 78.0–100.0)
MCV: 92.5 fL (ref 78.0–100.0)
Platelets: 153 10*3/uL (ref 150–400)
Platelets: 160 10*3/uL (ref 150–400)
RBC: 3.88 MIL/uL (ref 3.87–5.11)
RBC: 3.87 MIL/uL (ref 3.87–5.11)
RDW: 14.3 % (ref 11.5–15.5)
RDW: 14.4 % (ref 11.5–15.5)
WBC: 9 10*3/uL (ref 4.0–10.5)
WBC: 10.2 10*3/uL (ref 4.0–10.5)

## 2016-09-07 LAB — COMPREHENSIVE METABOLIC PANEL
ALT: 49 U/L (ref 14–54)
ANION GAP: 8 (ref 5–15)
AST: 47 U/L — ABNORMAL HIGH (ref 15–41)
Albumin: 3.9 g/dL (ref 3.5–5.0)
Alkaline Phosphatase: 41 U/L (ref 38–126)
BUN: 19 mg/dL (ref 6–20)
CHLORIDE: 106 mmol/L (ref 101–111)
CO2: 26 mmol/L (ref 22–32)
Calcium: 8.9 mg/dL (ref 8.9–10.3)
Creatinine, Ser: 0.76 mg/dL (ref 0.44–1.00)
Glucose, Bld: 128 mg/dL — ABNORMAL HIGH (ref 65–99)
POTASSIUM: 3.7 mmol/L (ref 3.5–5.1)
SODIUM: 140 mmol/L (ref 135–145)
Total Bilirubin: 0.5 mg/dL (ref 0.3–1.2)
Total Protein: 6.7 g/dL (ref 6.5–8.1)

## 2016-09-07 LAB — PROTIME-INR
INR: 1.05
Prothrombin Time: 13.7 seconds (ref 11.4–15.2)

## 2016-09-07 LAB — TYPE AND SCREEN
ABO/RH(D): B POS
Antibody Screen: NEGATIVE

## 2016-09-07 LAB — LIPASE, BLOOD: LIPASE: 50 U/L (ref 11–51)

## 2016-09-07 LAB — HEMOGLOBIN
HEMOGLOBIN: 12.6 g/dL (ref 12.0–15.0)
Hemoglobin: 12.7 g/dL (ref 12.0–15.0)

## 2016-09-07 LAB — ABO/RH: ABO/RH(D): B POS

## 2016-09-07 LAB — POC OCCULT BLOOD, ED: FECAL OCCULT BLD: POSITIVE — AB

## 2016-09-07 LAB — APTT: APTT: 25 s (ref 24–36)

## 2016-09-07 MED ORDER — OMEGA-3-ACID ETHYL ESTERS 1 G PO CAPS
1000.0000 mg | ORAL_CAPSULE | Freq: Every day | ORAL | Status: DC
  Administered 2016-09-08: 1000 mg via ORAL
  Filled 2016-09-07 (×2): qty 1

## 2016-09-07 MED ORDER — PANTOPRAZOLE SODIUM 40 MG PO TBEC
40.0000 mg | DELAYED_RELEASE_TABLET | Freq: Every day | ORAL | Status: DC
  Administered 2016-09-07: 40 mg via ORAL
  Filled 2016-09-07: qty 1

## 2016-09-07 MED ORDER — ONDANSETRON HCL 4 MG/2ML IJ SOLN: 4.0000 mg | Freq: Four times a day (QID) | INTRAMUSCULAR | Status: DC | PRN

## 2016-09-07 MED ORDER — ATORVASTATIN CALCIUM 40 MG PO TABS
40.0000 mg | ORAL_TABLET | Freq: Every day | ORAL | Status: DC
  Administered 2016-09-07: 40 mg via ORAL
  Filled 2016-09-07: qty 1

## 2016-09-07 MED ORDER — ONDANSETRON HCL 4 MG PO TABS: 4.0000 mg | ORAL_TABLET | Freq: Four times a day (QID) | ORAL | Status: DC | PRN

## 2016-09-07 MED ORDER — ACETAMINOPHEN 650 MG RE SUPP: 650.0000 mg | Freq: Four times a day (QID) | RECTAL | Status: DC | PRN

## 2016-09-07 MED ORDER — BIOTIN 5000 MCG PO CAPS: 5000.0000 ug | ORAL_CAPSULE | Freq: Every day | ORAL | Status: DC

## 2016-09-07 MED ORDER — METOPROLOL TARTRATE 25 MG PO TABS
25.0000 mg | ORAL_TABLET | Freq: Two times a day (BID) | ORAL | Status: DC
  Administered 2016-09-07 – 2016-09-08 (×2): 25 mg via ORAL
  Filled 2016-09-07 (×2): qty 1

## 2016-09-07 MED ORDER — BOOST / RESOURCE BREEZE PO LIQD
1.0000 | Freq: Three times a day (TID) | ORAL | Status: DC
  Administered 2016-09-08: 1 via ORAL

## 2016-09-07 MED ORDER — MELATONIN 3 MG PO CAPS: 3.0000 mg | ORAL_CAPSULE | Freq: Every evening | ORAL | Status: DC | PRN

## 2016-09-07 MED ORDER — VITAMIN C 500 MG PO TABS
500.0000 mg | ORAL_TABLET | Freq: Every day | ORAL | Status: DC
  Administered 2016-09-07 – 2016-09-08 (×2): 500 mg via ORAL
  Filled 2016-09-07 (×2): qty 1

## 2016-09-07 MED ORDER — TAMOXIFEN CITRATE 10 MG PO TABS
20.0000 mg | ORAL_TABLET | Freq: Every day | ORAL | Status: DC
  Administered 2016-09-08: 20 mg via ORAL
  Filled 2016-09-07: qty 2

## 2016-09-07 MED ORDER — VITAMIN D3 25 MCG (1000 UNIT) PO TABS
1000.0000 [IU] | ORAL_TABLET | Freq: Every day | ORAL | Status: DC
  Administered 2016-09-07 – 2016-09-08 (×2): 1000 [IU] via ORAL
  Filled 2016-09-07 (×2): qty 1

## 2016-09-07 MED ORDER — ADULT MULTIVITAMIN W/MINERALS CH
1.0000 | ORAL_TABLET | Freq: Every day | ORAL | Status: DC
  Administered 2016-09-07 – 2016-09-08 (×2): 1 via ORAL
  Filled 2016-09-07 (×2): qty 1

## 2016-09-07 MED ORDER — ACETAMINOPHEN 325 MG PO TABS
650.0000 mg | ORAL_TABLET | Freq: Four times a day (QID) | ORAL | Status: DC | PRN
  Administered 2016-09-07: 650 mg via ORAL
  Filled 2016-09-07: qty 2

## 2016-09-07 NOTE — Progress Notes (Signed)
PHARMACIST - PHYSICIAN ORDER COMMUNICATION  CONCERNING: P&T Medication Policy on Herbal Medications  DESCRIPTION:  This patient's order for:  Melatonin, biotin  has been noted.  This product(s) is classified as an "herbal" or natural product. Due to a lack of definitive safety studies or FDA approval, nonstandard manufacturing practices, plus the potential risk of unknown drug-drug interactions while on inpatient medications, the Pharmacy and Therapeutics Committee does not permit the use of "herbal" or natural products of this type within [facility].   ACTION TAKEN: The pharmacy department is unable to verify this order at this time and your patient has been informed of this safety policy. Please reevaluate patient's clinical condition at discharge and address if the herbal or natural product(s) should be resumed at that time.  Hershal Coria, PharmD, BCPS Pager: [phone] 09/07/2016 4:02 PM

## 2016-09-07 NOTE — Consult Note (Signed)
Consultation  Referring Provider:   Dr. Laverta Baltimore   Primary Care Physician:  Sheela Stack, MD Primary Gastroenterologist:  Althia Forts       Reason for Consultation:  Rectal bleeding            Impression / Plan:   Impression: 1. Rectal bleeding:One episode which started this morning around 7:00 after a bowel movement, bright red blood with some dark blood mixed in, lasted for at least 30 minutes, on exam appears to be a thrombosed hemorrhoid with stigmata of recent bleeding (scab over top), no abdominal pain; most likely patient's bleeding was from thrombosed hemorrhoid, could also consider diverticular bleed if this continues 2. Anemia: Hemoglobin typically around 13, currently 11.9 compared to 13.9 two months ago, doubt that a thrombosed hemorrhoid could cause this drop in hemoglobin, will need to monitor  Plan: 1. Agree with hemoglobin every 6 hours with transfusion as needed less than 7 2. At this time recommend further observation of the patient, if further signs of acute GI bleeding, could consider colonoscopy for further evaluation  3. Patient to remain on clear liquid diet for now 4. Will also discuss administration of hydrocortisone ointment twice a day to thrombosed hemorrhoid 5. Please await any further recommendations from Dr. Carlean Purl  Thank you for your kind consultation, we will continue to follow.  [name]  09/07/2016, 11:28 AM Pager #: [phone]    [facility] Attending   I have taken an interval history, reviewed the chart and examined the patient. I agree with the Advanced Practitioner's note, impression and recommendations.    Subsequent Hgbs are normal so probably was hemorrhoidal bleeding.  As long as ok in AM suggest we set her up for a direct outpatient colonoscopy at [place], MD, [facility] [phone] (pager) [phone] after 5 PM, weekends and holidays  09/07/2016 5:15  PM      HPI:   Jasmin Holloway is an 83 y.o. female with past history of aortic sclerosis, HLD, hypertension and CAD who presented to the emergency department this morning with a complaint of rectal bleeding.   Today, the patient describes that she woke up around 7 AM and had a normal soft solid bowel movement, but right after passing her stool, she had a large amount of bright red and then dark red blood which was produced. She tells me that she lives at [facility] and called the nurse. She was stuck on the toilet for at least 30 minutes because every time she would stand up she would see drips of bright red blood. After that amount of time she was able to put on a pad and a Depends diaper and make her way to the ER. Patient denied any pain with this.  She denies any associated shortness of breath or lightheadedness. The patient does tell me she has a history of hemorrhoids and is accustomed to some occasional bright red blood when wiping on the toilet paper, but typically this is gone after 1-2 wipes. This was "a lot more than usual and scared me". Patient does tell me she is on aspirin 81 mg daily. She has followed with a gastroenterologist for screening colonoscopies in the past but is unsure where or when her last one was.   Patient denies fever, chills, melena, change in diet recently, sick contacts or proceeding abdominal pain.  ED course: Rectal exam was positive for external hemorrhoids and bright red blood per rectum, no  melena; fecal occult blood test was positive  GI history: Patient is unsure where but has had screening colonoscopies and is up-to-date per her recollection. Called both [facility] and [facility] and they do not recognize her as a patient.  Past Medical History:  Diagnosis Date  . Aortic sclerosis    03/29/13 echo  . Arthritis    HANDS  . Breast cancer ([place])   . Coronary artery disease    DR. BERRY IS PT'S CARDIOLOGIST  . GERD (gastroesophageal reflux disease)   .  History of vertebral fracture   . Hyperlipidemia   . Hypertension   . Mitral regurgitation    mild 03/29/2013 echo  . PONV (postoperative nausea and vomiting)    PT HAD SEVERE NAUSEA WAKING UP IN RR AFTER BREAST LUMPECTOMY    Past Surgical History:  Procedure Laterality Date  . APPENDECTOMY    . BREAST LUMPECTOMY WITH RADIOACTIVE SEED LOCALIZATION Left 08/05/2014   Procedure: SEED LOCALIZED LEFT BREAST LUMPECTOMY;  Surgeon: Excell Seltzer, MD;  Location: [facility];  Service: General;  Laterality: Left;  . COLONOSCOPY    . CORONARY ANGIOPLASTY WITH STENT PLACEMENT  11/16/1998   stent to the LAD and PTCA side branch diagonal  . DILATION AND CURETTAGE OF UTERUS    . EYE SURGERY     both cataracts  . NM MYOCAR PERF WALL MOTION  07/13/2009  . RE-EXCISION OF BREAST LUMPECTOMY Left 08/18/2014   Procedure: RE-EXCISION OF LEFT BREAST LUMPECTOMY;  Surgeon: Excell Seltzer, MD;  Location: [facility];  Service: General;  Laterality: Left;  . TONSILLECTOMY      Family History  Problem Relation Age of Onset  . Cancer Mother   . Heart attack Father     Social History  Substance Use Topics  . Smoking status: Never Smoker  . Smokeless tobacco: Never Used  . Alcohol use 1.8 oz/week    3 Standard drinks or equivalent per week     Comment: wine    Prior to Admission medications   Medication Sig Start Date End Date Taking? Authorizing Provider  aspirin EC 81 MG tablet Take 81 mg by mouth every evening.    Yes Historical Provider, MD  atorvastatin (LIPITOR) 40 MG tablet Take 1 tablet (40 mg total) by mouth daily. Patient taking differently: Take 40 mg by mouth at bedtime.  03/12/13  Yes Terance Ice, MD  benazepril-hydrochlorthiazide (LOTENSIN HCT) 10-12.5 MG tablet Take 1 tablet by mouth daily. KEEP OV. 03/14/16  Yes Lorretta Harp, MD  Biotin 5000 MCG CAPS Take 5,000 mcg by mouth daily.    Yes Historical Provider, MD  cholecalciferol (VITAMIN D) 1000 UNITS tablet Take  1,000 Units by mouth daily.   Yes Historical Provider, MD  EVENING PRIMROSE OIL PO Take 1,300 mg by mouth daily.    Yes Historical Provider, MD  hydrocortisone (ANUSOL-HC) 25 MG suppository Place 25 mg rectally 2 (two) times daily as needed for hemorrhoids or itching.  10/15/14  Yes Historical Provider, MD  loperamide (IMODIUM) 2 MG capsule Take 1 capsule (2 mg total) by mouth 4 (four) times daily as needed for diarrhea or loose stools. 12/03/15  Yes Davonna Belling, MD  Melatonin 3 MG CAPS Take 3 mg by mouth at bedtime as needed (Sleep).    Yes Historical Provider, MD  metoprolol tartrate (LOPRESSOR) 25 MG tablet Take 1 tablet (25 mg total) by mouth 2 (two) times daily. KEEP OV. 03/14/16  Yes Lorretta Harp, MD  Misc Natural Products (  OSTEO BI-FLEX JOINT SHIELD) TABS Take 2 tablets by mouth daily.   Yes Historical Provider, MD  Multiple Minerals-Vitamins (CALCIUM CITRATE PLUS PO) Take 1 tablet by mouth 2 (two) times daily.   Yes Historical Provider, MD  Multiple Vitamin (MULTIVITAMIN WITH MINERALS) TABS tablet Take 1 tablet by mouth daily.   Yes Historical Provider, MD  naproxen sodium (ANAPROX) 220 MG tablet Take 440 mg by mouth daily.   Yes Historical Provider, MD  Omega-3 Fatty Acids (FISH OIL) 1200 MG CPDR Take 1,200 mg by mouth daily.    Yes Historical Provider, MD  pantoprazole (PROTONIX) 40 MG tablet Take 1 tablet (40 mg total) by mouth daily. Patient taking differently: Take 40 mg by mouth at bedtime.  01/24/13  Yes Terance Ice, MD  tamoxifen (NOLVADEX) 20 MG tablet Take 1 tablet by mouth  daily 12/07/15  Yes Nicholas Lose, MD  vitamin C (ASCORBIC ACID) 500 MG tablet Take 500 mg by mouth daily.   Yes Historical Provider, MD  cephALEXin (KEFLEX) 500 MG capsule Take 1 capsule (500 mg total) by mouth 2 (two) times daily. Patient not taking: Reported on 07/06/2016 12/03/15   Davonna Belling, MD  diazepam (VALIUM) 2 MG tablet Take 1 tablet (2 mg total) by mouth every 8 (eight) hours as needed  for muscle spasms. Patient not taking: Reported on 09/07/2016 07/06/16   Duffy Bruce, MD  omeprazole (PRILOSEC) 20 MG capsule Take 1 capsule (20 mg total) by mouth daily. Patient not taking: Reported on 09/07/2016 07/06/16 07/13/16  Duffy Bruce, MD  ondansetron (ZOFRAN-ODT) 4 MG disintegrating tablet Take 1 tablet (4 mg total) by mouth every 8 (eight) hours as needed for nausea or vomiting. Patient not taking: Reported on 07/06/2016 12/03/15   Davonna Belling, MD    No current facility-administered medications for this encounter.    Current Outpatient Prescriptions  Medication Sig Dispense Refill  . aspirin EC 81 MG tablet Take 81 mg by mouth every evening.     Marland Kitchen atorvastatin (LIPITOR) 40 MG tablet Take 1 tablet (40 mg total) by mouth daily. (Patient taking differently: Take 40 mg by mouth at bedtime. ) 90 tablet 3  . benazepril-hydrochlorthiazide (LOTENSIN HCT) 10-12.5 MG tablet Take 1 tablet by mouth daily. KEEP OV. 90 tablet 1  . Biotin 5000 MCG CAPS Take 5,000 mcg by mouth daily.     . cholecalciferol (VITAMIN D) 1000 UNITS tablet Take 1,000 Units by mouth daily.    Marland Kitchen EVENING PRIMROSE OIL PO Take 1,300 mg by mouth daily.     . hydrocortisone (ANUSOL-HC) 25 MG suppository Place 25 mg rectally 2 (two) times daily as needed for hemorrhoids or itching.   7  . loperamide (IMODIUM) 2 MG capsule Take 1 capsule (2 mg total) by mouth 4 (four) times daily as needed for diarrhea or loose stools. 12 capsule 0  . Melatonin 3 MG CAPS Take 3 mg by mouth at bedtime as needed (Sleep).     . metoprolol tartrate (LOPRESSOR) 25 MG tablet Take 1 tablet (25 mg total) by mouth 2 (two) times daily. KEEP OV. 180 tablet 1  . Misc Natural Products (OSTEO BI-FLEX JOINT SHIELD) TABS Take 2 tablets by mouth daily.    . Multiple Minerals-Vitamins (CALCIUM CITRATE PLUS PO) Take 1 tablet by mouth 2 (two) times daily.    . Multiple Vitamin (MULTIVITAMIN WITH MINERALS) TABS tablet Take 1 tablet by mouth daily.    . naproxen  sodium (ANAPROX) 220 MG tablet Take 440 mg by mouth daily.    Marland Kitchen  Omega-3 Fatty Acids (FISH OIL) 1200 MG CPDR Take 1,200 mg by mouth daily.     . pantoprazole (PROTONIX) 40 MG tablet Take 1 tablet (40 mg total) by mouth daily. (Patient taking differently: Take 40 mg by mouth at bedtime. ) 30 tablet 1  . tamoxifen (NOLVADEX) 20 MG tablet Take 1 tablet by mouth  daily 90 tablet 3  . vitamin C (ASCORBIC ACID) 500 MG tablet Take 500 mg by mouth daily.    . cephALEXin (KEFLEX) 500 MG capsule Take 1 capsule (500 mg total) by mouth 2 (two) times daily. (Patient not taking: Reported on 07/06/2016) 10 capsule 0  . diazepam (VALIUM) 2 MG tablet Take 1 tablet (2 mg total) by mouth every 8 (eight) hours as needed for muscle spasms. (Patient not taking: Reported on 09/07/2016) 8 tablet 0  . omeprazole (PRILOSEC) 20 MG capsule Take 1 capsule (20 mg total) by mouth daily. (Patient not taking: Reported on 09/07/2016) 7 capsule 0  . ondansetron (ZOFRAN-ODT) 4 MG disintegrating tablet Take 1 tablet (4 mg total) by mouth every 8 (eight) hours as needed for nausea or vomiting. (Patient not taking: Reported on 07/06/2016) 10 tablet 0    Allergies as of 09/07/2016 - Review Complete 09/07/2016  Allergen Reaction Noted  . Codeine Nausea Only 07/16/2014     Review of Systems:    Constitutional: No weight loss, fever, chills, weakness or fatigue Skin: No rash  Cardiovascular: No chest pain Respiratory: No SOB  Gastrointestinal: See HPI and otherwise negative Genitourinary: No dysuria or change in urinary frequency Neurological: No headache, dizziness or syncope Musculoskeletal: No new muscle or joint pain Hematologic: No bruising Psychiatric: No history of depression    Physical Exam:  Vital signs in last 24 hours: Temp:  [97.9 F (36.6 C)] 97.9 F (36.6 C) (01/03 0848) Pulse Rate:  [66-85] 69 (01/03 1100) Resp:  [14-17] 14 (01/03 1100) BP: (107-138)/(59-87) 112/59 (01/03 1100) SpO2:  [96 %-97 %] 96 % (01/03  1100) Weight:  [145 lb (65.8 kg)] 145 lb (65.8 kg) (01/03 0848)   General:   Pleasant Caucasian female appears to be in NAD, Well developed, Well nourished, alert and cooperative Head:  Normocephalic and atraumatic. Eyes:   PEERL, EOMI. No icterus. Conjunctiva pink. Ears:  Normal auditory acuity. Neck:  Supple Throat: Oral cavity and pharynx without inflammation, swelling or lesion. Teeth in good condition. Lungs: Respirations even and unlabored. Lungs clear to auscultation bilaterally.   No wheezes, crackles, or rhonchi.  Heart: Normal S1, S2. No MRG. Regular rate and rhythm. No peripheral edema, cyanosis or pallor.  Abdomen:  Soft, nondistended, nontender. No rebound or guarding. Normal bowel sounds. No appreciable masses or hepatomegaly. Rectal: External exam: Thrombosed hemorrhoid with stigmata of recent bleeding, tender to palpation, scant amount of bright red blood seen on patients pad; internal exam: No tenderness, no bloody residue Msk:  Symmetrical without gross deformities. Peripheral pulses intact.  Extremities:  Without edema, no deformity or joint abnormality. Normal ROM, normal sensation. Neurologic:  Alert and  oriented x4;  grossly normal neurologically.  Skin:   Dry and intact without significant lesions or rashes. Psychiatric:  Demonstrates good judgement and reason without abnormal affect or behaviors.   LAB RESULTS:  Recent Labs  09/07/16 0921  WBC 7.1  HGB 11.9*  HCT 35.8*  PLT 154   BMET  Recent Labs  09/07/16 0921  NA 140  K 3.7  CL 106  CO2 26  GLUCOSE 128*  BUN 19  CREATININE  0.76  CALCIUM 8.9   LFT  Recent Labs  09/07/16 0921  PROT 6.7  ALBUMIN 3.9  AST 47*  ALT 49  ALKPHOS 41  BILITOT 0.5    PREVIOUS ENDOSCOPIES:            See HPI

## 2016-09-07 NOTE — H&P (Signed)
History and Physical  Jasmin Holloway [id number] DOB: 1933-11-14 DOA: 09/07/2016   PCP: Sheela Stack, MD   Patient coming from: Home  Chief Complaint: hematochezia  HPI:  Jasmin Holloway is a 83 y.o. female with medical history of hypertension, hyperlipidemia, coronary artery disease, invasive ductal carcinoma of the left breast presented with hematochezia on the morning of 09/08/2015.   Patient describes that she woke up around 7 AM and had a normal soft solid bowel movement, but right after passing her stool, she had a large amount of bright red and then dark red blood which was produced. She lives at [facility] and called the nurse. She was stuck on the commode for at least 30 minutes because every time she would stand up she would see drips of bright red blood. The patient states that she intermittently has some blood on her toilet paper which she attributed to history of hemorrhoids with constipation. However she has not noted any hematochezia for over 3 months. She had a previous colonoscopy, but does not recall the results she states that this was 7-10 years ago. She denies any chest discomfort, shortness of breath, dizziness, presyncope. There is no abdominal pain, nausea, vomiting, fevers, chills.  She denies any NSAIDS  In the emergency department, the patient was afebrile and hemodynamically stable saturating well on room air. Rectal exam showed bright red blood in the emergency department. Hemoglobin was 11.9. Otherwise CBC was essentially unremarkable. BMP and hepatic panel was unremarkable. [facility] was consulted.  Assessment/Plan: Hematochezia -Suspect hemorrhoidal versus diverticular bleed -Appreciate GI consult -Clear liquid diet pending further GI intervention if necessary -Hemoglobin every 6 hours -Check coags -hold aspirin  Acute blood loss anemia  -Baseline hemoglobin approximately 13  -Presenting hemoglobin 11.9  -Hemoglobin every 6 hours    -Check  Hypertension  -Discontinue benazepril/HCTZ in the setting of soft blood pressure  -Continue metoprolol tartrate   Hyperlipidemia  -Continue Lipitor   Invasive ductal carcinoma of the left breast  -Status post left mastectomy  -Continue tamoxifen  -follows Dr. Lindi Adie  Coronary artery disease  -No anginal symptoms  -Continue metoprolol tartrate  -Holding aspirin presently  -Continue Lipitor       Past Medical History:  Diagnosis Date  . Aortic sclerosis    03/29/13 echo  . Arthritis    HANDS  . Breast cancer ([place])   . Coronary artery disease    DR. BERRY IS PT'S CARDIOLOGIST  . GERD (gastroesophageal reflux disease)   . History of vertebral fracture   . Hyperlipidemia   . Hypertension   . Mitral regurgitation    mild 03/29/2013 echo  . PONV (postoperative nausea and vomiting)    PT HAD SEVERE NAUSEA WAKING UP IN RR AFTER BREAST LUMPECTOMY   Past Surgical History:  Procedure Laterality Date  . APPENDECTOMY    . BREAST LUMPECTOMY WITH RADIOACTIVE SEED LOCALIZATION Left 08/05/2014   Procedure: SEED LOCALIZED LEFT BREAST LUMPECTOMY;  Surgeon: Excell Seltzer, MD;  Location: [facility];  Service: General;  Laterality: Left;  . COLONOSCOPY    . CORONARY ANGIOPLASTY WITH STENT PLACEMENT  11/16/1998   stent to the LAD and PTCA side branch diagonal  . DILATION AND CURETTAGE OF UTERUS    . EYE SURGERY     both cataracts  . NM MYOCAR PERF WALL MOTION  07/13/2009  . RE-EXCISION OF BREAST LUMPECTOMY Left 08/18/2014   Procedure: RE-EXCISION OF LEFT BREAST LUMPECTOMY;  Surgeon: Excell Seltzer, MD;  Location: [facility];  Service: General;  Laterality: Left;  . TONSILLECTOMY     Social History:  reports that she has never smoked. She has never used smokeless tobacco. She reports that she drinks about 1.8 oz of alcohol per week . She reports that she does not use drugs.   Family History  Problem Relation Age of Onset  . Cancer Mother   . Heart attack  Father      Allergies  Allergen Reactions  . Codeine Nausea Only     Prior to Admission medications   Medication Sig Start Date End Date Taking? Authorizing Provider  aspirin EC 81 MG tablet Take 81 mg by mouth every evening.    Yes Historical Provider, MD  atorvastatin (LIPITOR) 40 MG tablet Take 1 tablet (40 mg total) by mouth daily. Patient taking differently: Take 40 mg by mouth at bedtime.  03/12/13  Yes Terance Ice, MD  benazepril-hydrochlorthiazide (LOTENSIN HCT) 10-12.5 MG tablet Take 1 tablet by mouth daily. KEEP OV. 03/14/16  Yes Lorretta Harp, MD  Biotin 5000 MCG CAPS Take 5,000 mcg by mouth daily.    Yes Historical Provider, MD  cholecalciferol (VITAMIN D) 1000 UNITS tablet Take 1,000 Units by mouth daily.   Yes Historical Provider, MD  EVENING PRIMROSE OIL PO Take 1,300 mg by mouth daily.    Yes Historical Provider, MD  hydrocortisone (ANUSOL-HC) 25 MG suppository Place 25 mg rectally 2 (two) times daily as needed for hemorrhoids or itching.  10/15/14  Yes Historical Provider, MD  loperamide (IMODIUM) 2 MG capsule Take 1 capsule (2 mg total) by mouth 4 (four) times daily as needed for diarrhea or loose stools. 12/03/15  Yes Davonna Belling, MD  Melatonin 3 MG CAPS Take 3 mg by mouth at bedtime as needed (Sleep).    Yes Historical Provider, MD  metoprolol tartrate (LOPRESSOR) 25 MG tablet Take 1 tablet (25 mg total) by mouth 2 (two) times daily. KEEP OV. 03/14/16  Yes Lorretta Harp, MD  Misc Natural Products (OSTEO BI-FLEX JOINT SHIELD) TABS Take 2 tablets by mouth daily.   Yes Historical Provider, MD  Multiple Minerals-Vitamins (CALCIUM CITRATE PLUS PO) Take 1 tablet by mouth 2 (two) times daily.   Yes Historical Provider, MD  Multiple Vitamin (MULTIVITAMIN WITH MINERALS) TABS tablet Take 1 tablet by mouth daily.   Yes Historical Provider, MD  naproxen sodium (ANAPROX) 220 MG tablet Take 440 mg by mouth daily.   Yes Historical Provider, MD  Omega-3 Fatty Acids (FISH  OIL) 1200 MG CPDR Take 1,200 mg by mouth daily.    Yes Historical Provider, MD  pantoprazole (PROTONIX) 40 MG tablet Take 1 tablet (40 mg total) by mouth daily. Patient taking differently: Take 40 mg by mouth at bedtime.  01/24/13  Yes Terance Ice, MD  tamoxifen (NOLVADEX) 20 MG tablet Take 1 tablet by mouth  daily 12/07/15  Yes Nicholas Lose, MD  vitamin C (ASCORBIC ACID) 500 MG tablet Take 500 mg by mouth daily.   Yes Historical Provider, MD  cephALEXin (KEFLEX) 500 MG capsule Take 1 capsule (500 mg total) by mouth 2 (two) times daily. Patient not taking: Reported on 07/06/2016 12/03/15   Davonna Belling, MD  diazepam (VALIUM) 2 MG tablet Take 1 tablet (2 mg total) by mouth every 8 (eight) hours as needed for muscle spasms. Patient not taking: Reported on 09/07/2016 07/06/16   Duffy Bruce, MD  omeprazole (PRILOSEC) 20 MG capsule Take 1 capsule (20 mg total) by mouth daily. Patient  not taking: Reported on 09/07/2016 07/06/16 07/13/16  Duffy Bruce, MD  ondansetron (ZOFRAN-ODT) 4 MG disintegrating tablet Take 1 tablet (4 mg total) by mouth every 8 (eight) hours as needed for nausea or vomiting. Patient not taking: Reported on 07/06/2016 12/03/15   Davonna Belling, MD    Review of Systems:  Constitutional:  No weight loss, night sweats, Fevers, chills, fatigue.  Head&Eyes: No headache.  No vision loss.  No eye pain or scotoma ENT:  No Difficulty swallowing,Tooth/dental problems,Sore throat,  No ear ache, post nasal drip,  Cardio-vascular:  No chest pain, Orthopnea, PND, swelling in lower extremities,  dizziness, palpitations  GI:   melena, heartburn, indigestion, Resp:  No shortness of breath with exertion or at rest. No cough. No coughing up of blood .No wheezing.No chest wall deformity  Skin:  no rash or lesions.  GU:  no dysuria, change in color of urine, no urgency or frequency. No flank pain.  Musculoskeletal:  No joint pain or swelling. No decreased range of motion. No back pain.   Psych:  No change in mood or affect. No depression or anxiety. Neurologic: No headache, no dysesthesia, no focal weakness, no vision loss. No syncope  Physical Exam: Vitals:   09/07/16 1030 09/07/16 1100 09/07/16 1130 09/07/16 1200  BP: 107/63 112/59 106/65 112/63  Pulse: 68 69 67 71  Resp: 17 14 16 15   Temp:      TempSrc:      SpO2: 97% 96% 95% 95%  Weight:      Height:       General:  A&O x 3, NAD, nontoxic, pleasant/cooperative Head/Eye: No conjunctival hemorrhage, no icterus, Walker/AT, No nystagmus ENT:  No icterus,  No thrush, good dentition, no pharyngeal exudate Neck:  No masses, no lymphadenpathy, no bruits CV:  RRR, no rub, no gallop, no S3 Lung:  CTAB, good air movement, no wheeze, no rhonchi Abdomen: soft/NT, +BS, nondistended, no peritoneal signs Ext: No cyanosis, No rashes, No petechiae, No lymphangitis, No edema Neuro: CNII-XII intact, strength 4/5 in bilateral upper and lower extremities, no dysmetria  Labs on Admission:  Basic Metabolic Panel:  Recent Labs Lab 09/07/16 0921  NA 140  K 3.7  CL 106  CO2 26  GLUCOSE 128*  BUN 19  CREATININE 0.76  CALCIUM 8.9   Liver Function Tests:  Recent Labs Lab 09/07/16 0921  AST 47*  ALT 49  ALKPHOS 41  BILITOT 0.5  PROT 6.7  ALBUMIN 3.9    Recent Labs Lab 09/07/16 0921  LIPASE 50   No results for input(s): AMMONIA in the last 168 hours. CBC:  Recent Labs Lab 09/07/16 0921  WBC 7.1  NEUTROABS 3.8  HGB 11.9*  HCT 35.8*  MCV 94.7  PLT 154   Coagulation Profile: No results for input(s): INR, PROTIME in the last 168 hours. Cardiac Enzymes: No results for input(s): CKTOTAL, CKMB, CKMBINDEX, TROPONINI in the last 168 hours. BNP: Invalid input(s): POCBNP CBG: No results for input(s): GLUCAP in the last 168 hours. Urine analysis:    Component Value Date/Time   COLORURINE YELLOW 12/03/2015 1927   APPEARANCEUR CLOUDY (A) 12/03/2015 1927   LABSPEC 1.017 12/03/2015 1927   PHURINE 5.5  12/03/2015 1927   GLUCOSEU NEGATIVE 12/03/2015 1927   HGBUR TRACE (A) 12/03/2015 1927   BILIRUBINUR NEGATIVE 12/03/2015 1927   KETONESUR 15 (A) 12/03/2015 1927   PROTEINUR NEGATIVE 12/03/2015 1927   NITRITE NEGATIVE 12/03/2015 1927   LEUKOCYTESUR TRACE (A) 12/03/2015 1927   Sepsis Labs: @LABRCNTIP (procalcitonin:4,lacticidven:4) )  No results found for this or any previous visit (from the past 240 hour(s)).   Radiological Exams on Admission: No results found.      Time spent:50 minutes Code Status:   FULL Family Communication:  Spouse updated at bedside Disposition Plan: expect 1-2 day hospitalization Consults called: [facility] DVT Prophylaxis: SCDs  Thelda Gagan, DO  [facility] Pager [phone]  If 7PM-7AM, please contact night-coverage [email or web address] Password [facility] 09/07/2016, 12:31 PM

## 2016-09-07 NOTE — ED Notes (Signed)
Coming from [facility]-states profuse rectal bleeding with clots this am-started 1/2 ago-no blood thinners

## 2016-09-07 NOTE — ED Provider Notes (Signed)
Emergency Department Provider Note   I have reviewed the triage vital signs and the nursing notes.   HISTORY  Chief Complaint No chief complaint on file.   HPI Jasmin Holloway is a 83 y.o. female with PMH of AS, HLD, HTN, and CAD presents to the emergency department for evaluation of rectal bleeding this morning. Patient states that she woke up and had a normal bowel movement but afterwards she had a large amount of bright red and then dark red to almost black stool. The patient denies any pain with this episode. She had significant bleeding for "at least 30 minutes." She denies any associated shortness of breath or lightheadedness. Patient has a history of hemorrhoids and is accustomed to occasional bleeding with that but states that this was far more than she is ever had with hemorrhoid bleeding. Patient is not anticoagulated. She put on a diaper with pad and feels that the bleeding has slowed.    Past Medical History:  Diagnosis Date  . Aortic sclerosis    03/29/13 echo  . Arthritis    HANDS  . Breast cancer ([place])   . Coronary artery disease    DR. BERRY IS PT'S CARDIOLOGIST  . GERD (gastroesophageal reflux disease)   . History of vertebral fracture   . Hyperlipidemia   . Hypertension   . Mitral regurgitation    mild 03/29/2013 echo  . PONV (postoperative nausea and vomiting)    PT HAD SEVERE NAUSEA WAKING UP IN RR AFTER BREAST LUMPECTOMY    Patient Active Problem List   Diagnosis Date Noted  . Hematochezia 09/07/2016  . Gastrointestinal hemorrhage   . Osteopenia 09/09/2014  . Breast cancer of upper-inner quadrant of left female breast ([place]) 07/14/2014  . Angina at rest, chest pain, arm pain 05/23/2014  . Coronary artery disease 11/08/2013  . Essential hypertension 11/08/2013  . Hyperlipidemia 11/08/2013    Past Surgical History:  Procedure Laterality Date  . APPENDECTOMY    . BREAST LUMPECTOMY WITH RADIOACTIVE SEED LOCALIZATION Left 08/05/2014   Procedure:  SEED LOCALIZED LEFT BREAST LUMPECTOMY;  Surgeon: Excell Seltzer, MD;  Location: [facility];  Service: General;  Laterality: Left;  . COLONOSCOPY    . CORONARY ANGIOPLASTY WITH STENT PLACEMENT  11/16/1998   stent to the LAD and PTCA side branch diagonal  . DILATION AND CURETTAGE OF UTERUS    . EYE SURGERY     both cataracts  . NM MYOCAR PERF WALL MOTION  07/13/2009  . RE-EXCISION OF BREAST LUMPECTOMY Left 08/18/2014   Procedure: RE-EXCISION OF LEFT BREAST LUMPECTOMY;  Surgeon: Excell Seltzer, MD;  Location: [facility];  Service: General;  Laterality: Left;  . TONSILLECTOMY        Allergies Codeine  Family History  Problem Relation Age of Onset  . Cancer Mother   . Heart attack Father     Social History Social History  Substance Use Topics  . Smoking status: Never Smoker  . Smokeless tobacco: Never Used  . Alcohol use 1.8 oz/week    3 Standard drinks or equivalent per week     Comment: wine    Review of Systems  Constitutional: No fever/chills Eyes: No visual changes. ENT: No sore throat. Cardiovascular: Denies chest pain. Respiratory: Denies shortness of breath. Gastrointestinal: No abdominal pain. Positive rectal bleeding. No nausea, no vomiting.  No diarrhea.  No constipation. Genitourinary: Negative for dysuria. Musculoskeletal: Negative for back pain. Skin: Negative for rash. Neurological: Negative for headaches, focal weakness or numbness.  10-point ROS otherwise negative.  ____________________________________________   PHYSICAL EXAM:  VITAL SIGNS: ED Triage Vitals [09/07/16 0848]  Enc Vitals Group     BP 138/87     Pulse Rate 85     Resp 14     Temp 97.9 F (36.6 C)     Temp Source Oral     SpO2 96 %     Weight 145 lb (65.8 kg)     Height 5\' 4"  (1.626 m)   Constitutional: Alert and oriented. Well appearing and in no acute distress. Eyes: Conjunctivae are normal.  Head: Atraumatic. Nose: No congestion/rhinnorhea. Mouth/Throat:  Mucous membranes are moist.  Oropharynx non-erythematous. Neck: No stridor.   Cardiovascular: Normal rate, regular rhythm. Good peripheral circulation. Grossly normal heart sounds.   Respiratory: Normal respiratory effort.  No retractions. Lungs CTAB. Gastrointestinal: Soft and nontender. No distention. Rectal exam positive for external hemorrhoids and BRBPR. No melena.  Musculoskeletal: No lower extremity tenderness nor edema. No gross deformities of extremities. Neurologic:  Normal speech and language. No gross focal neurologic deficits are appreciated.  Skin:  Skin is warm, dry and intact. No rash noted.  ____________________________________________   LABS (all labs ordered are listed, but only abnormal results are displayed)  Labs Reviewed  COMPREHENSIVE METABOLIC PANEL - Abnormal; Notable for the following:       Result Value   Glucose, Bld 128 (*)    AST 47 (*)    All other components within normal limits  CBC WITH DIFFERENTIAL/PLATELET - Abnormal; Notable for the following:    RBC 3.78 (*)    Hemoglobin 11.9 (*)    HCT 35.8 (*)    All other components within normal limits  POC OCCULT BLOOD, ED - Abnormal; Notable for the following:    Fecal Occult Bld POSITIVE (*)    All other components within normal limits  LIPASE, BLOOD  HEMOGLOBIN  CBC  HEMOGLOBIN  HEMOGLOBIN  CBC  CBC  APTT  PROTIME-INR  TYPE AND SCREEN  ABO/RH   ____________________________________________  RADIOLOGY  None ____________________________________________   PROCEDURES  Procedure(s) performed:   Procedures  None ____________________________________________   INITIAL IMPRESSION / ASSESSMENT AND PLAN / ED COURSE  Pertinent labs & imaging results that were available during my care of the patient were reviewed by me and considered in my medical decision making (see chart for details).  Patient presents to the emergency department for evaluation of painless BRBPR. Patient with reported  bleeding for approximately 30 minutes but is slowed. Some of the blood described as dark and almost black. No melena on my exam. Patient does have external hemorrhoids and some bright red bleeding currently that appears mild. She is asymptomatic at this time. Normal vital signs. Unsure of her last colonoscopy. Plan for labs and reassessment.  10:38 AM Spoke with [facility] who will be down to see the patient.   Spoke with the hospitalist who will be down to see the patient and place admission orders.  ____________________________________________  FINAL CLINICAL IMPRESSION(S) / ED DIAGNOSES  Final diagnoses:  Gastrointestinal hemorrhage, unspecified gastrointestinal hemorrhage type     MEDICATIONS GIVEN DURING THIS VISIT:  Medications  metoprolol tartrate (LOPRESSOR) tablet 25 mg (not administered)  tamoxifen (NOLVADEX) tablet 20 mg (not administered)  multivitamin with minerals tablet 1 tablet (not administered)  Fish Oil CPDR 1,200 mg (not administered)  atorvastatin (LIPITOR) tablet 40 mg (not administered)  pantoprazole (PROTONIX) EC tablet 40 mg (not administered)  cholecalciferol (VITAMIN D) tablet 1,000 Units (not administered)  vitamin C (ASCORBIC ACID) tablet 500 mg (not administered)  acetaminophen (TYLENOL) tablet 650 mg (not administered)    Or  acetaminophen (TYLENOL) suppository 650 mg (not administered)  ondansetron (ZOFRAN) tablet 4 mg (not administered)    Or  ondansetron (ZOFRAN) injection 4 mg (not administered)     NEW OUTPATIENT MEDICATIONS STARTED DURING THIS VISIT:  None   Note:  This document was prepared using Dragon voice recognition software and may include unintentional dictation errors.  Nanda Quinton, MD Emergency Medicine   Margette Fast, MD 09/07/16 [phone]

## 2016-09-07 NOTE — ED Notes (Signed)
RN is starting a line and collecting blood work 

## 2016-09-07 NOTE — ED Triage Notes (Signed)
Patient reports that she had dark red and bright red blood with clots continuously x 30 minutes starting at 0730 today. Patient denies abdominal pain, N/V, weakness, or dizziness.

## 2016-09-08 ENCOUNTER — Telehealth: Payer: Self-pay

## 2016-09-08 DIAGNOSIS — C50212 Malignant neoplasm of upper-inner quadrant of left female breast: Secondary | ICD-10-CM | POA: Diagnosis not present

## 2016-09-08 DIAGNOSIS — K922 Gastrointestinal hemorrhage, unspecified: Secondary | ICD-10-CM | POA: Diagnosis not present

## 2016-09-08 DIAGNOSIS — E785 Hyperlipidemia, unspecified: Secondary | ICD-10-CM

## 2016-09-08 DIAGNOSIS — I2510 Atherosclerotic heart disease of native coronary artery without angina pectoris: Secondary | ICD-10-CM

## 2016-09-08 DIAGNOSIS — I10 Essential (primary) hypertension: Secondary | ICD-10-CM | POA: Diagnosis not present

## 2016-09-08 DIAGNOSIS — K921 Melena: Secondary | ICD-10-CM | POA: Diagnosis not present

## 2016-09-08 DIAGNOSIS — K625 Hemorrhage of anus and rectum: Secondary | ICD-10-CM | POA: Diagnosis not present

## 2016-09-08 DIAGNOSIS — I2583 Coronary atherosclerosis due to lipid rich plaque: Secondary | ICD-10-CM

## 2016-09-08 LAB — BASIC METABOLIC PANEL
Anion gap: 11 (ref 5–15)
BUN: 11 mg/dL (ref 6–20)
CHLORIDE: 104 mmol/L (ref 101–111)
CO2: 25 mmol/L (ref 22–32)
CREATININE: 0.82 mg/dL (ref 0.44–1.00)
Calcium: 9 mg/dL (ref 8.9–10.3)
GFR calc non Af Amer: >60 mL/min (ref >60)
Glucose, Bld: 123 mg/dL — ABNORMAL HIGH (ref 65–99)
Potassium: 3.7 mmol/L (ref 3.5–5.1)
Sodium: 140 mmol/L (ref 135–145)

## 2016-09-08 LAB — CBC
HEMATOCRIT: 37 % (ref 36.0–46.0)
Hemoglobin: 12.5 g/dL (ref 12.0–15.0)
MCH: 32 pg (ref 26.0–34.0)
MCHC: 33.8 g/dL (ref 30.0–36.0)
MCV: 94.6 fL (ref 78.0–100.0)
Platelets: 154 10*3/uL (ref 150–400)
RBC: 3.91 MIL/uL (ref 3.87–5.11)
RDW: 14.5 % (ref 11.5–15.5)
WBC: 8 10*3/uL (ref 4.0–10.5)

## 2016-09-08 MED ORDER — HYDROCORTISONE 1 % EX OINT: 1.0000 "application " | TOPICAL_OINTMENT | Freq: Two times a day (BID) | CUTANEOUS | 0 refills | Status: DC

## 2016-09-08 MED ORDER — BOOST / RESOURCE BREEZE PO LIQD: 1.0000 | Freq: Three times a day (TID) | ORAL | 0 refills | Status: DC

## 2016-09-08 NOTE — Telephone Encounter (Signed)
-----   Message from Levin Erp, [place] sent at 09/08/2016  9:20 AM EST ----- Regarding: schedule direct colo with Dr. Carlean Purl Please schedule pt for direct outpatient colo with Dr. Carlean Purl in the next 1-2 mos. Please call her and arrange. She was recently admitted to the hospital for self-limited rectal bleeding.   Thanks-JLL

## 2016-09-08 NOTE — Telephone Encounter (Signed)
Patient notified of recommendations and consents to proceed with colonoscopy.  She is scheduled for 09/29/16 8:30 and colonoscopy for 10/07/16

## 2016-09-08 NOTE — Progress Notes (Signed)
    Progress Note   Subjective  Chief Complaint: Hematochezia  Pt doing well this morning, she did have a BM last night which was regular and formed. She tells me she had a "trace" amount of brb on her pad this morning when she went to the bathroom. Otherwise she tells me she is hungry and ready for a regular meal and ready to go home. Continues to deny other symptoms.    Objective   Vital signs in last 24 hours: Temp:  [98.3 F (36.8 C)-98.9 F (37.2 C)] 98.3 F (36.8 C) (01/04 0545) Pulse Rate:  [66-85] 77 (01/04 0545) Resp:  [14-18] 16 (01/04 0545) BP: (102-132)/(58-80) 126/64 (01/04 0545) SpO2:  [93 %-97 %] 95 % (01/04 0545) Last BM Date: 09/07/16 General: Caucasian female in NAD Heart:  Regular rate and rhythm; no murmurs Lungs: Respirations even and unlabored, lungs CTA bilaterally Abdomen:  Soft, nontender and nondistended. Normal bowel sounds. Extremities:  Without edema. Neurologic:  Alert and oriented,  grossly normal neurologically. Psych:  Cooperative. Normal mood and affect.   Lab Results:  Recent Labs  09/07/16 1602 09/07/16 1825 09/07/16 2159 09/08/16 0411  WBC 9.0  --  10.2 8.0  HGB 12.4 12.6 12.0 12.5  HCT 36.6  --  35.8* 37.0  PLT 153  --  160 154   BMET  Recent Labs  09/07/16 0921 09/08/16 0411  NA 140 140  K 3.7 3.7  CL 106 104  CO2 26 25  GLUCOSE 128* 123*  BUN 19 11  CREATININE 0.76 0.82  CALCIUM 8.9 9.0      Assessment / Plan:   Assessment: 1. Rectal Bleeding: one episode morning of 09/08/15-lg amt of brb, slight decrease in hgb, none since admission, external exam revealed ext hem with stigmata of recent bleeding, this is thought to have been the etiology, with pts report of "flat polyp" in the rectum and last colo >3 yrs ago, per Dr. Carlean Purl will arrange for direct colo outpatient in the next 1-2 mos.  Plan: 1. OK for discharge this morning per Dr. Carlean Purl. Hgb is stable, no further bleeding overnight.  2. Arranged for direct  outpatient colo with Dr. Carlean Purl, our clinic will call and help patient schedule. 3. Ok to resume regular diet.   Thank you for this kind consultation. We will sign off.   LOS: 0 days   Levin Erp  09/08/2016, 9:14 AM  Pager # [phone]  Agree with Ms. Lemmon's evaluation and management. Gatha Mayer, MD, [facility]

## 2016-09-08 NOTE — Discharge Summary (Signed)
Physician Discharge Summary  Jasmin Holloway [id number] DOB: 1934/04/30 DOA: 09/07/2016  PCP: Sheela Stack, MD  Admit date: 09/07/2016 Discharge date: 09/08/2016  Admitted From: Home Disposition:  Home  Recommendations for Outpatient Follow-up:  1. Follow up with PCP in 1-2 weeks 2. Follow up with GI Clinic with Dr. Carlean Purl for outpatient colonoscopy 3. Please obtain BMP/CBC in one week  Home Health: NO Equipment/Devices: None  Discharge Condition: Stable CODE STATUS: FULL CODE Diet recommendation: Heart Healthy    Brief/Interim Summary: Jasmin Holloway is a 83 y.o. female with medical history of hypertension, hyperlipidemia, coronary artery disease, invasive ductal carcinoma of the left breast presented with hematochezia on the morning of 09/08/2015.  Patient describes that she woke up around 7 AM and had a normalsoft solidbowel movement, but right after passing her stool, she had a large amount of bright red and then dark red blood which was produced. She lives at [facility] [facility] and called the nurse. She was stuck on the commode for at least 30 minutes because every time she would stand up she would see dripsof bright red blood. The patient states that she intermittently has some blood on her toilet paper which she attributed to history of hemorrhoids with constipation. However she has not noted any hematochezia for over 3 months. She had a previous colonoscopy, but does not recall the results she states that this was 7-10 years ago. She denies any chest discomfort, shortness of breath, dizziness, presyncope. There is no abdominal pain, nausea, vomiting, fevers, chills.  She denies any NSAIDS. In the emergency department, the patient was afebrile and hemodynamically stable saturating well on room air. Rectal exam showed bright red blood in the emergency department. Hemoglobin was 11.9. Otherwise CBC was essentially unremarkable. BMP and hepatic panel was unremarkable. [facility] was  consulted and evaluated the patient and recommended direct outpatient colonoscopy as patient's Hb was stable with no furhter bleeding. They recommended regular diet and Hydrocortisone Ointment 2x a day for a week for her thrombosed hemorrhoid. Patient was deemed medically stable and was told to follow up with PCP and Gastroenterology for colonoscopy and return to the ED if symptoms worsened or changed.   Discharge Diagnoses:  Active Problems:   Coronary artery disease   Essential hypertension   Hyperlipidemia   Breast cancer of upper-inner quadrant of left female breast ([facility])   Hematochezia  Hematochezia/BRBPR -Suspect hemorrhoidal versus diverticular bleed; Most likely Hemorrhoidal as patient has thrombosed Hemorrhoid -Appreciated GI consult -Can go back to Regular Diet -Hemoglobin was stable and slightly improved -Outpatient follow up with Dr. Carlean Purl in GI for Colonoscopy; GI office to call to schedule appointment -Continue Hydrocortisone Ointment for Thrombosed Hemorrhoud  Acute blood loss anemia  -Baseline hemoglobin approximately 13  -Presenting hemoglobin 11.9 and improved to 12.5 -Check CBC as an Outpatient   Hypertension  -Continue home benazepril/HCTZ and Metoprolol tartrate   Hyperlipidemia  -Continue Lipitor   Invasive ductal carcinoma of the left breast  -Status post left mastectomy  -Continue Tamoxifen  -follows Dr. Lindi Adie; Follow as an Outpatient  Coronary artery disease  -No anginal symptoms  -Continue Metoprolol tartrate and Lipitor  -May Resume ASA at D/C  Discharge Instructions  Discharge Instructions    Call MD for:  difficulty breathing, headache or visual disturbances    Complete by:  As directed    Call MD for:  extreme fatigue    Complete by:  As directed    Call MD for:  persistant dizziness or light-headedness  Complete by:  As directed    Call MD for:  persistant nausea and vomiting    Complete by:  As directed    Call MD for:   redness, tenderness, or signs of infection (pain, swelling, redness, odor or green/yellow discharge around incision site)    Complete by:  As directed    Call MD for:  temperature >100.4    Complete by:  As directed    Diet - low sodium heart healthy    Complete by:  As directed    Discharge instructions    Complete by:  As directed    Follow up with PCP and with Dr. Carlean Purl as an outpatient. Take all medications as prescribed. If symptoms worsen or change please return to the ED for evaluation.   Increase activity slowly    Complete by:  As directed      Allergies as of 09/08/2016      Reactions   Codeine Nausea Only      Medication List    STOP taking these medications   cephALEXin 500 MG capsule Commonly known as:  KEFLEX   diazepam 2 MG tablet Commonly known as:  VALIUM   hydrocortisone 25 MG suppository Commonly known as:  ANUSOL-HC   naproxen sodium 220 MG tablet Commonly known as:  ANAPROX   omeprazole 20 MG capsule Commonly known as:  PRILOSEC   ondansetron 4 MG disintegrating tablet Commonly known as:  ZOFRAN-ODT     TAKE these medications   aspirin EC 81 MG tablet Take 81 mg by mouth every evening.   atorvastatin 40 MG tablet Commonly known as:  LIPITOR Take 1 tablet (40 mg total) by mouth daily. What changed:  when to take this   benazepril-hydrochlorthiazide 10-12.5 MG tablet Commonly known as:  LOTENSIN HCT Take 1 tablet by mouth daily. KEEP OV.   Biotin 5000 MCG Caps Take 5,000 mcg by mouth daily.   CALCIUM CITRATE PLUS PO Take 1 tablet by mouth 2 (two) times daily.   cholecalciferol 1000 units tablet Commonly known as:  VITAMIN D Take 1,000 Units by mouth daily.   EVENING PRIMROSE OIL PO Take 1,300 mg by mouth daily.   feeding supplement Liqd Take 1 Container by mouth 3 (three) times daily between meals.   Fish Oil 1200 MG Cpdr Take 1,200 mg by mouth daily.   hydrocortisone 1 % ointment Apply 1 application topically 2 (two) times  daily. PER GI apply with gloved Finger for a week.   loperamide 2 MG capsule Commonly known as:  IMODIUM Take 1 capsule (2 mg total) by mouth 4 (four) times daily as needed for diarrhea or loose stools.   Melatonin 3 MG Caps Take 3 mg by mouth at bedtime as needed (Sleep).   metoprolol tartrate 25 MG tablet Commonly known as:  LOPRESSOR Take 1 tablet (25 mg total) by mouth 2 (two) times daily. KEEP OV.   multivitamin with minerals Tabs tablet Take 1 tablet by mouth daily.   OSTEO BI-FLEX JOINT SHIELD Tabs Take 2 tablets by mouth daily.   pantoprazole 40 MG tablet Commonly known as:  PROTONIX Take 1 tablet (40 mg total) by mouth daily. What changed:  when to take this   tamoxifen 20 MG tablet Commonly known as:  NOLVADEX Take 1 tablet by mouth  daily   vitamin C 500 MG tablet Commonly known as:  ASCORBIC ACID Take 500 mg by mouth daily.      Follow-up Information    SOUTH,STEPHEN  Antony Haste, MD. Call in 1 week(s).   Specialty:  Endocrinology Why:  Call to [facility] Follow Up Contact information: [place] [address] [phone]        Silvano Rusk, MD. Call in 1 week(s).   Specialty:  Gastroenterology Why:  Follow up for Outpatient Colonoscopy Contact information: [address] [address] [phone]          Allergies  Allergen Reactions  . Codeine Nausea Only    Consultations:  Gastroenterology  Procedures/Studies: No results found.  Subjective: Seen and examined at bedside and was doing well. No more bleeding episodes. No CP/SOB/Dizziness or lightheadedness. No other concerns or complaints at this time.   Discharge Exam: Vitals:   09/07/16 2016 09/08/16 0545  BP: 132/80 126/64  Pulse: 85 77  Resp: 18 16  Temp: 98.9 F (37.2 C) 98.3 F (36.8 C)   Vitals:   09/07/16 1314 09/07/16 1430 09/07/16 2016 09/08/16 0545  BP: 102/61 110/60 132/80 126/64  Pulse: 71 74 85 77  Resp: 15 14 18 16   Temp:   98.9  F (37.2 C) 98.3 F (36.8 C)  TempSrc:   Oral Oral  SpO2: 95% 94% 93% 95%  Weight:      Height:       General: Pt is alert, awake, not in acute distress Cardiovascular: RRR, S1/S2 +, no rubs, no gallops Respiratory: CTA bilaterally, no wheezing, no rhonchi Abdominal: Soft, NT, ND, bowel sounds + Extremities: no edema, no cyanosis Gu: Patient has thrombosed External Hemorrhoid.  The results of significant diagnostics from this hospitalization (including imaging, microbiology, ancillary and laboratory) are listed below for reference.    Microbiology: No results found for this or any previous visit (from the past 240 hour(s)).   Labs: BNP (last 3 results) No results for input(s): BNP in the last 8760 hours. Basic Metabolic Panel:  Recent Labs Lab 09/07/16 0921 09/08/16 0411  NA 140 140  K 3.7 3.7  CL 106 104  CO2 26 25  GLUCOSE 128* 123*  BUN 19 11  CREATININE 0.76 0.82  CALCIUM 8.9 9.0   Liver Function Tests:  Recent Labs Lab 09/07/16 0921  AST 47*  ALT 49  ALKPHOS 41  BILITOT 0.5  PROT 6.7  ALBUMIN 3.9    Recent Labs Lab 09/07/16 0921  LIPASE 50   No results for input(s): AMMONIA in the last 168 hours. CBC:  Recent Labs Lab 09/07/16 0921 09/07/16 1243 09/07/16 1602 09/07/16 1825 09/07/16 2159 09/08/16 0411  WBC 7.1  --  9.0  --  10.2 8.0  NEUTROABS 3.8  --   --   --   --   --   HGB 11.9* 12.7 12.4 12.6 12.0 12.5  HCT 35.8*  --  36.6  --  35.8* 37.0  MCV 94.7  --  94.3  --  92.5 94.6  PLT 154  --  153  --  160 154   Cardiac Enzymes: No results for input(s): CKTOTAL, CKMB, CKMBINDEX, TROPONINI in the last 168 hours. BNP: Invalid input(s): POCBNP CBG: No results for input(s): GLUCAP in the last 168 hours. D-Dimer No results for input(s): DDIMER in the last 72 hours. Hgb A1c No results for input(s): HGBA1C in the last 72 hours. Lipid Profile No results for input(s): CHOL, HDL, LDLCALC, TRIG, CHOLHDL, LDLDIRECT in the last 72  hours. Thyroid function studies No results for input(s): TSH, T4TOTAL, T3FREE, THYROIDAB in the last 72 hours.  Invalid input(s): FREET3 Anemia  work up No results for input(s): VITAMINB12, FOLATE, FERRITIN, TIBC, IRON, RETICCTPCT in the last 72 hours. Urinalysis    Component Value Date/Time   COLORURINE YELLOW 12/03/2015 1927   APPEARANCEUR CLOUDY (A) 12/03/2015 1927   LABSPEC 1.017 12/03/2015 1927   PHURINE 5.5 12/03/2015 1927   GLUCOSEU NEGATIVE 12/03/2015 1927   HGBUR TRACE (A) 12/03/2015 1927   BILIRUBINUR NEGATIVE 12/03/2015 1927   KETONESUR 15 (A) 12/03/2015 1927   PROTEINUR NEGATIVE 12/03/2015 1927   NITRITE NEGATIVE 12/03/2015 1927   LEUKOCYTESUR TRACE (A) 12/03/2015 1927   Sepsis Labs Invalid input(s): PROCALCITONIN,  WBC,  LACTICIDVEN Microbiology No results found for this or any previous visit (from the past 240 hour(s)).  Time coordinating discharge: Over 30 minutes  SIGNED:  Kerney Elbe, DO [facility] 09/08/2016, 10:15 AM Pager [phone]  If 7PM-7AM, please contact night-coverage [email or web address] Password [email or web address]

## 2016-09-11 ENCOUNTER — Other Ambulatory Visit: Payer: Self-pay | Admitting: Hematology and Oncology

## 2016-09-11 ENCOUNTER — Other Ambulatory Visit: Payer: Self-pay | Admitting: Cardiovascular Disease

## 2016-09-20 ENCOUNTER — Ambulatory Visit
Admission: RE | Admit: 2016-09-20 | Discharge: 2016-09-20 | Disposition: A | Discharge status: Home / Self Care | Payer: Medicare Other | Source: Ambulatory Visit | Attending: Endocrinology | Admitting: Endocrinology

## 2016-09-20 DIAGNOSIS — Z853 Personal history of malignant neoplasm of breast: Secondary | ICD-10-CM

## 2016-09-20 DIAGNOSIS — D649 Anemia, unspecified: Secondary | ICD-10-CM | POA: Diagnosis not present

## 2016-09-20 DIAGNOSIS — R921 Mammographic calcification found on diagnostic imaging of breast: Secondary | ICD-10-CM | POA: Diagnosis not present

## 2016-09-27 DIAGNOSIS — M546 Pain in thoracic spine: Secondary | ICD-10-CM | POA: Diagnosis not present

## 2016-09-27 DIAGNOSIS — S22070D Wedge compression fracture of T9-T10 vertebra, subsequent encounter for fracture with routine healing: Secondary | ICD-10-CM | POA: Diagnosis not present

## 2016-09-29 ENCOUNTER — Ambulatory Visit (AMBULATORY_SURGERY_CENTER): Payer: Self-pay

## 2016-09-29 VITALS — Ht 63.5 in | Wt 144.8 lb

## 2016-09-29 DIAGNOSIS — K625 Hemorrhage of anus and rectum: Secondary | ICD-10-CM

## 2016-09-29 NOTE — Progress Notes (Signed)
No allergies to eggs or soy No diet meds No home oxygen PONV with general anesthesia  Declined emmi

## 2016-10-03 DIAGNOSIS — N3946 Mixed incontinence: Secondary | ICD-10-CM | POA: Diagnosis not present

## 2016-10-03 DIAGNOSIS — N3020 Other chronic cystitis without hematuria: Secondary | ICD-10-CM | POA: Diagnosis not present

## 2016-10-04 DIAGNOSIS — E784 Other hyperlipidemia: Secondary | ICD-10-CM | POA: Diagnosis not present

## 2016-10-04 DIAGNOSIS — Z9861 Coronary angioplasty status: Secondary | ICD-10-CM | POA: Diagnosis not present

## 2016-10-04 DIAGNOSIS — K922 Gastrointestinal hemorrhage, unspecified: Secondary | ICD-10-CM | POA: Diagnosis not present

## 2016-10-04 DIAGNOSIS — Z1389 Encounter for screening for other disorder: Secondary | ICD-10-CM | POA: Diagnosis not present

## 2016-10-04 DIAGNOSIS — Z6824 Body mass index (BMI) 24.0-24.9, adult: Secondary | ICD-10-CM | POA: Diagnosis not present

## 2016-10-04 DIAGNOSIS — I10 Essential (primary) hypertension: Secondary | ICD-10-CM | POA: Diagnosis not present

## 2016-10-04 DIAGNOSIS — C50919 Malignant neoplasm of unspecified site of unspecified female breast: Secondary | ICD-10-CM | POA: Diagnosis not present

## 2016-10-04 DIAGNOSIS — R7309 Other abnormal glucose: Secondary | ICD-10-CM | POA: Diagnosis not present

## 2016-10-07 ENCOUNTER — Encounter: Payer: Self-pay | Admitting: Internal Medicine

## 2016-10-07 ENCOUNTER — Ambulatory Visit (AMBULATORY_SURGERY_CENTER): Payer: Medicare Other | Admitting: Internal Medicine

## 2016-10-07 VITALS — BP 115/47 | HR 83 | Temp 96.6°F | Resp 13 | Ht 63.5 in | Wt 144.0 lb

## 2016-10-07 DIAGNOSIS — K219 Gastro-esophageal reflux disease without esophagitis: Secondary | ICD-10-CM | POA: Diagnosis not present

## 2016-10-07 DIAGNOSIS — K625 Hemorrhage of anus and rectum: Secondary | ICD-10-CM | POA: Diagnosis present

## 2016-10-07 DIAGNOSIS — I10 Essential (primary) hypertension: Secondary | ICD-10-CM | POA: Diagnosis not present

## 2016-10-07 DIAGNOSIS — Z886 Allergy status to analgesic agent status: Secondary | ICD-10-CM | POA: Diagnosis not present

## 2016-10-07 MED ORDER — SODIUM CHLORIDE 0.9 % IV SOLN: 500.0000 mL | INTRAVENOUS | Status: DC

## 2016-10-07 NOTE — Op Note (Signed)
[place] Patient Name: Remingtyn Trollinger Procedure Date: 10/07/2016 1:04 PM MRN: [id number] Endoscopist: Gatha Mayer , MD Age: 83 Referring MD:  Date of Birth: 26-May-1934 Gender: Female Account #: [id number] Procedure:                Colonoscopy Indications:              Rectal bleeding Medicines:                Propofol per Anesthesia, Monitored Anesthesia Care Procedure:                Pre-Anesthesia Assessment:                           - Prior to the procedure, a History and Physical                            was performed, and patient medications and                            allergies were reviewed. The patient's tolerance of                            previous anesthesia was also reviewed. The risks                            and benefits of the procedure and the sedation                            options and risks were discussed with the patient.                            All questions were answered, and informed consent                            was obtained. Prior Anticoagulants: The patient has                            taken no previous anticoagulant or antiplatelet                            agents. ASA Grade Assessment: II - A patient with                            mild systemic disease. After reviewing the risks                            and benefits, the patient was deemed in                            satisfactory condition to undergo the procedure.                           After obtaining informed consent, the colonoscope  was passed under direct vision. Throughout the                            procedure, the patient's blood pressure, pulse, and                            oxygen saturations were monitored continuously. The                            Model PCF-H190DL [id number]) scope was introduced                            through the anus and advanced to the the cecum,                            identified by  appendiceal orifice and ileocecal                            valve. The colonoscopy was performed without                            difficulty. The patient tolerated the procedure                            well. The quality of the bowel preparation was                            excellent. The bowel preparation used was Miralax.                            The ileocecal valve, appendiceal orifice, and                            rectum were photographed. Scope In: 1:11:02 PM Scope Out: 1:23:20 PM Scope Withdrawal Time: 0 hours 7 minutes 11 seconds  Total Procedure Duration: 0 hours 12 minutes 18 seconds  Findings:                 The perianal and digital rectal examinations were                            normal.                           A few small and large-mouthed diverticula were                            found in the sigmoid colon.                           Internal hemorrhoids were found during retroflexion.                           The exam was otherwise without abnormality on  direct and retroflexion views. Complications:            No immediate complications. Estimated Blood Loss:     Estimated blood loss: none. Impression:               - Diverticulosis in the sigmoid colon.                           - Internal hemorrhoids.                           - The examination was otherwise normal on direct                            and retroflexion views.                           - No specimens collected. Recommendation:           - Patient has a contact number available for                            emergencies. The signs and symptoms of potential                            delayed complications were discussed with the                            patient. Return to normal activities tomorrow.                            Written discharge instructions were provided to the                            patient.                           - Resume previous  diet.                           - Continue present medications.                           - No repeat colonoscopy due to age. Gatha Mayer, MD 10/07/2016 1:31:07 PM This report has been signed electronically.

## 2016-10-07 NOTE — Patient Instructions (Addendum)
    I saw hemorrhoids and diverticulosis - both of which could have bled. No polyps or cancer seen.  I appreciate the opportunity to care for you. Gatha Mayer, MD, [facility]  Impression/Recommendations:  Diverticulosis handout given to patient. Hemorrhoid handout given to patient.  No repeat colonoscopy.  YOU HAD AN ENDOSCOPIC PROCEDURE TODAY AT [facility]:   Refer to the procedure report that was given to you for any specific questions about what was found during the examination.  If the procedure report does not answer your questions, please call your gastroenterologist to clarify.  If you requested that your care partner not be given the details of your procedure findings, then the procedure report has been included in a sealed envelope for you to review at your convenience later.  YOU SHOULD EXPECT: Some feelings of bloating in the abdomen. Passage of more gas than usual.  Walking can help get rid of the air that was put into your GI tract during the procedure and reduce the bloating. If you had a lower endoscopy (such as a colonoscopy or flexible sigmoidoscopy) you may notice spotting of blood in your stool or on the toilet paper. If you underwent a bowel prep for your procedure, you may not have a normal bowel movement for a few days.  Please Note:  You might notice some irritation and congestion in your nose or some drainage.  This is from the oxygen used during your procedure.  There is no need for concern and it should clear up in a day or so.  SYMPTOMS TO REPORT IMMEDIATELY:   Following lower endoscopy (colonoscopy or flexible sigmoidoscopy):  Excessive amounts of blood in the stool  Significant tenderness or worsening of abdominal pains  Swelling of the abdomen that is new, acute  Fever of 100F or higher For urgent or emergent issues, a gastroenterologist can be reached at any hour by calling [phone].   DIET:  We do recommend a small meal at  first, but then you may proceed to your regular diet.  Drink plenty of fluids but you should avoid alcoholic beverages for 24 hours.  ACTIVITY:  You should plan to take it easy for the rest of today and you should NOT DRIVE or use heavy machinery until tomorrow (because of the sedation medicines used during the test).    FOLLOW UP: Our staff will call the number listed on your records the next business day following your procedure to check on you and address any questions or concerns that you may have regarding the information given to you following your procedure. If we do not reach you, we will leave a message.  However, if you are feeling well and you are not experiencing any problems, there is no need to return our call.  We will assume that you have returned to your regular daily activities without incident.  If any biopsies were taken you will be contacted by phone or by letter within the next 1-3 weeks.  Please call us at [phone] if you have not heard about the biopsies in 3 weeks.    SIGNATURES/CONFIDENTIALITY: You and/or your care partner have signed paperwork which will be entered into your electronic medical record.  These signatures attest to the fact that that the information above on your After Visit Summary has been reviewed and is understood.  Full responsibility of the confidentiality of this discharge information lies with you and/or your care-partner.

## 2016-10-07 NOTE — Progress Notes (Signed)
Report to PACU, RN, vss, BBS= Clear.  

## 2016-10-10 ENCOUNTER — Telehealth: Payer: Self-pay | Admitting: *Deleted

## 2016-10-10 NOTE — Telephone Encounter (Signed)
  Follow up Call-  Call back number 10/07/2016  Post procedure Call Back phone  # [phone]  Permission to leave phone message Yes  Some recent data might be hidden     Patient questions:  Do you have a fever, pain , or abdominal swelling? No. Pain Score  0 *  Have you tolerated food without any problems? Yes.    Have you been able to return to your normal activities? Yes.    Do you have any questions about your discharge instructions: Diet   No. Medications  No. Follow up visit  No.  Do you have questions or concerns about your Care? No.  Actions: * If pain score is 4 or above: No action needed, pain <4.

## 2016-10-13 DIAGNOSIS — M859 Disorder of bone density and structure, unspecified: Secondary | ICD-10-CM | POA: Diagnosis not present

## 2016-10-31 DIAGNOSIS — Z961 Presence of intraocular lens: Secondary | ICD-10-CM | POA: Diagnosis not present

## 2016-10-31 DIAGNOSIS — H524 Presbyopia: Secondary | ICD-10-CM | POA: Diagnosis not present

## 2016-11-09 DIAGNOSIS — R350 Frequency of micturition: Secondary | ICD-10-CM | POA: Diagnosis not present

## 2016-11-09 DIAGNOSIS — N3946 Mixed incontinence: Secondary | ICD-10-CM | POA: Diagnosis not present

## 2016-12-21 ENCOUNTER — Encounter: Payer: Self-pay | Admitting: Cardiovascular Disease

## 2016-12-21 ENCOUNTER — Ambulatory Visit (INDEPENDENT_AMBULATORY_CARE_PROVIDER_SITE_OTHER): Payer: Medicare Other | Admitting: Cardiovascular Disease

## 2016-12-21 VITALS — BP 138/76 | HR 78 | Ht 63.75 in | Wt 143.0 lb

## 2016-12-21 DIAGNOSIS — E7800 Pure hypercholesterolemia, unspecified: Secondary | ICD-10-CM

## 2016-12-21 DIAGNOSIS — I10 Essential (primary) hypertension: Secondary | ICD-10-CM

## 2016-12-21 DIAGNOSIS — I2510 Atherosclerotic heart disease of native coronary artery without angina pectoris: Secondary | ICD-10-CM | POA: Diagnosis not present

## 2016-12-21 NOTE — Assessment & Plan Note (Signed)
History of CAD status post LAD diagonal branch intervention by Dr. Rollene Fare March 2000. Her last Myoview performed in 2000 was nonischemic. She denies chest pain or shortness of breath.

## 2016-12-21 NOTE — Progress Notes (Signed)
12/21/2016 Shine [facility]   1933-11-06  [id number]  Primary Physician Sheela Stack, MD Primary Cardiologist: Lorretta Harp MD Renae Gloss  HPI:  Ms. Junio is a delightful 83 year old thin appearing married Caucasian female mother of 2 children, grandmother to 2 grandchildren whose husband [name] is also a patient of mine. Last saw her in the office 12/23/15.The patient was formally a patient of of Dr. Terance Ice. She has a history of CAD status post LAD/diagonal branch intervention by Dr. Rollene Fare back in March of 2000. She has not had an procedure since. Her last Myoview performed in 2000 and was nonischemic. She is completely asymptomatic is very active. She and her husband have since moved into [facility] and used their exercise facility frequently.Her other problems include history of treated hypertension and hyperlipidemia.    Current Outpatient Prescriptions  Medication Sig Dispense Refill  . aspirin EC 81 MG tablet Take 81 mg by mouth every evening.     Marland Kitchen atorvastatin (LIPITOR) 40 MG tablet Take 1 tablet (40 mg total) by mouth daily. (Patient taking differently: Take 40 mg by mouth at bedtime. ) 90 tablet 3  . benazepril-hydrochlorthiazide (LOTENSIN HCT) 10-12.5 MG tablet TAKE 1 TABLET BY MOUTH  DAILY 90 tablet 0  . Biotin 5000 MCG CAPS Take 5,000 mcg by mouth daily.     . cholecalciferol (VITAMIN D) 1000 UNITS tablet Take 1,000 Units by mouth daily.    Marland Kitchen EVENING PRIMROSE OIL PO Take 1,300 mg by mouth daily.     . Melatonin 3 MG CAPS Take 3 mg by mouth at bedtime as needed (Sleep).     . metoprolol tartrate (LOPRESSOR) 25 MG tablet TAKE 1 TABLET BY MOUTH 2  TIMES DAILY 180 tablet 0  . Misc Natural Products (OSTEO BI-FLEX JOINT SHIELD) TABS Take 2 tablets by mouth daily.    . Multiple Minerals-Vitamins (CALCIUM CITRATE PLUS PO) Take 1 tablet by mouth 2 (two) times daily.    . Multiple Vitamin (MULTIVITAMIN WITH MINERALS) TABS  tablet Take 1 tablet by mouth daily.    . Omega-3 Fatty Acids (FISH OIL) 1200 MG CPDR Take 1,200 mg by mouth daily.     . pantoprazole (PROTONIX) 40 MG tablet Take 1 tablet (40 mg total) by mouth daily. (Patient taking differently: Take 40 mg by mouth at bedtime. ) 30 tablet 1  . tamoxifen (NOLVADEX) 20 MG tablet TAKE 1 TABLET BY MOUTH  DAILY 90 tablet 2  . vitamin C (ASCORBIC ACID) 500 MG tablet Take 500 mg by mouth daily.     Current Facility-Administered Medications  Medication Dose Route Frequency Provider Last Rate Last Dose  . 0.9 %  sodium chloride infusion  500 mL Intravenous Continuous Gatha Mayer, MD        Allergies  Allergen Reactions  . Codeine Nausea Only    Social History   Social History  . Marital status: Married    Spouse name: N/A  . Number of children: N/A  . Years of education: N/A   Occupational History  . Not on file.   Social History Main Topics  . Smoking status: Never Smoker  . Smokeless tobacco: Never Used  . Alcohol use 1.2 oz/week    2 Standard drinks or equivalent per week     Comment: wine  . Drug use: No  . Sexual activity: Not on file   Other Topics Concern  . Not on file   Social History Narrative  .  No narrative on file     Review of Systems: General: negative for chills, fever, night sweats or weight changes.  Cardiovascular: negative for chest pain, dyspnea on exertion, edema, orthopnea, palpitations, paroxysmal nocturnal dyspnea or shortness of breath Dermatological: negative for rash Respiratory: negative for cough or wheezing Urologic: negative for hematuria Abdominal: negative for nausea, vomiting, diarrhea, bright red blood per rectum, melena, or hematemesis Neurologic: negative for visual changes, syncope, or dizziness All other systems reviewed and are otherwise negative except as noted above.    Blood pressure 138/76, pulse 78, height 5' 3.75" (1.619 m), weight 143 lb (64.9 kg).  General appearance: alert and no  distress Neck: no adenopathy, no carotid bruit, no JVD, supple, symmetrical, trachea midline and thyroid not enlarged, symmetric, no tenderness/mass/nodules Lungs: clear to auscultation bilaterally Heart: regular rate and rhythm, S1, S2 normal, no murmur, click, rub or gallop Extremities: extremities normal, atraumatic, no cyanosis or edema  EKG sinus rhythm at 78 with left axis deviation. I personally reviewed this EKG.  ASSESSMENT AND PLAN:   Coronary artery disease History of CAD status post LAD diagonal branch intervention by Dr. Rollene Fare March 2000. Her last Myoview performed in 2000 was nonischemic. She denies chest pain or shortness of breath.  Essential hypertension History of hypertension blood pressure measures 130/76. She is on benazepril, hydrochlorothiazide and metoprolol. Continue current meds at current dosing  Hyperlipidemia History of hyperlipidemia on statin therapy followed by her PCP      Lorretta Harp MD [facility], [facility] 12/21/2016 10:54 AM

## 2016-12-21 NOTE — Assessment & Plan Note (Signed)
History of hyperlipidemia on statin therapy followed by her PCP. 

## 2016-12-21 NOTE — Patient Instructions (Signed)

## 2016-12-21 NOTE — Assessment & Plan Note (Signed)
History of hypertension blood pressure measures 130/76. She is on benazepril, hydrochlorothiazide and metoprolol. Continue current meds at current dosing

## 2016-12-26 DIAGNOSIS — M546 Pain in thoracic spine: Secondary | ICD-10-CM | POA: Diagnosis not present

## 2016-12-26 DIAGNOSIS — S22070D Wedge compression fracture of T9-T10 vertebra, subsequent encounter for fracture with routine healing: Secondary | ICD-10-CM | POA: Diagnosis not present

## 2017-01-13 ENCOUNTER — Telehealth: Payer: Self-pay | Admitting: *Deleted

## 2017-01-13 ENCOUNTER — Other Ambulatory Visit: Payer: Self-pay

## 2017-01-13 DIAGNOSIS — Z170 Estrogen receptor positive status [ER+]: Principal | ICD-10-CM

## 2017-01-13 DIAGNOSIS — C50212 Malignant neoplasm of upper-inner quadrant of left female breast: Secondary | ICD-10-CM

## 2017-01-13 NOTE — Telephone Encounter (Signed)
"  My last visit I was told to see a gynecologist.  I'm on tamoxifen for five years.  Who does Dr.Gudena recommend?  I'm 83 years old.  Have not seen Dr. Ronita Hipps in six years and need to start over.  I'd like to see a female.  Do you know anything about Dr. Edwinna Areola.  A friend mentioned this name to me.  Return number [phone]."

## 2017-01-13 NOTE — Telephone Encounter (Signed)
Will give pt a call thank you.

## 2017-01-20 ENCOUNTER — Telehealth: Payer: Self-pay | Admitting: Emergency Medicine

## 2017-01-20 NOTE — Telephone Encounter (Signed)
Patient called to follow up on GYN referral; this nurse faxed last office note, demographics and insurance card to Dr Satira Anis Miller's office. Advised patient that they should be contacting her in the next few days and if not to call this office back.

## 2017-01-23 ENCOUNTER — Telehealth: Payer: Self-pay | Admitting: Obstetrics & Gynecology

## 2017-01-23 NOTE — Telephone Encounter (Signed)
Called and left a message for patient to call back to schedule a new patient doctor referral. °

## 2017-02-13 ENCOUNTER — Other Ambulatory Visit (HOSPITAL_COMMUNITY)
Admission: RE | Admit: 2017-02-13 | Discharge: 2017-02-13 | Disposition: A | Discharge status: Home / Self Care | Payer: Medicare Other | Source: Ambulatory Visit | Attending: Obstetrics & Gynecology | Admitting: Obstetrics & Gynecology

## 2017-02-13 ENCOUNTER — Ambulatory Visit (INDEPENDENT_AMBULATORY_CARE_PROVIDER_SITE_OTHER): Payer: Medicare Other | Admitting: Obstetrics & Gynecology

## 2017-02-13 ENCOUNTER — Encounter: Payer: Self-pay | Admitting: Obstetrics & Gynecology

## 2017-02-13 VITALS — BP 134/70 | HR 76 | Resp 16 | Ht 63.25 in | Wt 143.0 lb

## 2017-02-13 DIAGNOSIS — Z01419 Encounter for gynecological examination (general) (routine) without abnormal findings: Secondary | ICD-10-CM

## 2017-02-13 DIAGNOSIS — N9089 Other specified noninflammatory disorders of vulva and perineum: Secondary | ICD-10-CM

## 2017-02-13 DIAGNOSIS — N859 Noninflammatory disorder of uterus, unspecified: Secondary | ICD-10-CM | POA: Diagnosis not present

## 2017-02-13 DIAGNOSIS — N858 Other specified noninflammatory disorders of uterus: Secondary | ICD-10-CM

## 2017-02-13 DIAGNOSIS — L900 Lichen sclerosus et atrophicus: Secondary | ICD-10-CM | POA: Diagnosis not present

## 2017-02-13 DIAGNOSIS — Z124 Encounter for screening for malignant neoplasm of cervix: Secondary | ICD-10-CM | POA: Insufficient documentation

## 2017-02-13 NOTE — Progress Notes (Signed)
[name] [name] here to establish care.  H/o invasive ductal carcinoma of the breast 11/15.  Lumpectomy was 12/15 with pathology showing 0.4cm invasive ductal carcinoma and DCIS.  Surgical margin was involved.  Repeat excision performed two weeks later.  Now on Tamoxifen 20mg  daily for five years.  Will complete therapy 09/2019.  Had episode of significant rectal bleeding in January.  Admitted and monitored with stable hemoglobin and only mild anemia.  Out patient colonoscopy was recommended and she was evaluated with colonoscopy by Dr. Carlean Purl in February.  No active bleeding was noted.  Internal hemorrhoids were noted.    Unrelated, pt had CT done 02/02/16 due to possible renal stones.  Review of CT results showed comment stating:   Small area of low-attenuation centrally within the uterus of questionable significance. Consider pelvic ultrasound if warranted Clinically.  Pt reports never being informed of this finding.  PCP:  Dr. Forde Dandy.  Is typically seen twice yearly. Cardiologist:  Dr. Gwenlyn Found.  H/o two stents placed.      Patient's last menstrual period was 09/05/1985 (approximate).          Sexually active: No.  The current method of family planning is post menopausal status.    Exercising: Yes.    walking Smoker:  no  Health Maintenance: Pap:  ? History of abnormal Pap:  no MMG:  09/20/16 BIRADS3:Probably Benign. F/u 6 months. Left Lumpectomy.  Colonoscopy:  10/07/16 normal. No f/u needed  BMD:   10/2016 with PCP TDaP:  PCP Pneumonia vaccine(s): Done with PCP Zostavax:   Done with PCP Hep C testing: N/A Screening Labs: PCP   reports that she has never smoked. She has never used smokeless tobacco. She reports that she drinks about 1.2 oz of alcohol per week . She reports that she does not use drugs.  Past Medical History:  Diagnosis Date  . Aortic sclerosis    03/29/13 echo  . Arthritis    HANDS  . Breast cancer ([place])   . Cataracts, bilateral   . Coronary artery  disease    DR. BERRY IS PT'S CARDIOLOGIST  . GERD (gastroesophageal reflux disease)   . History of vertebral fracture   . Hyperlipidemia   . Hypertension   . Mitral regurgitation    mild 03/29/2013 echo  . PONV (postoperative nausea and vomiting)    PT HAD SEVERE NAUSEA WAKING UP IN RR AFTER BREAST LUMPECTOMY  . Rectal bleeding    not sure exactly what date    Past Surgical History:  Procedure Laterality Date  . APPENDECTOMY    . BREAST LUMPECTOMY WITH RADIOACTIVE SEED LOCALIZATION Left 08/05/2014   Procedure: SEED LOCALIZED LEFT BREAST LUMPECTOMY;  Surgeon: Excell Seltzer, MD;  Location: [facility];  Service: General;  Laterality: Left;  . CATARACT EXTRACTION, BILATERAL    . COLONOSCOPY    . CORONARY ANGIOPLASTY WITH STENT PLACEMENT  11/16/1998   stent to the LAD and PTCA side branch diagonal  . DILATION AND CURETTAGE OF UTERUS    . EYE SURGERY     both cataracts  . NM MYOCAR PERF WALL MOTION  07/13/2009  . RE-EXCISION OF BREAST LUMPECTOMY Left 08/18/2014   Procedure: RE-EXCISION OF LEFT BREAST LUMPECTOMY;  Surgeon: Excell Seltzer, MD;  Location: [facility];  Service: General;  Laterality: Left;  . TONSILLECTOMY      Current Outpatient Prescriptions  Medication Sig Dispense Refill  . aspirin EC 81 MG tablet Take 81 mg by mouth every evening.     Marland Kitchen  atorvastatin (LIPITOR) 40 MG tablet Take 1 tablet (40 mg total) by mouth daily. (Patient taking differently: Take 40 mg by mouth at bedtime. ) 90 tablet 3  . benazepril-hydrochlorthiazide (LOTENSIN HCT) 10-12.5 MG tablet TAKE 1 TABLET BY MOUTH  DAILY 90 tablet 0  . Biotin 5000 MCG CAPS Take 5,000 mcg by mouth daily.     . cholecalciferol (VITAMIN D) 1000 UNITS tablet Take 1,000 Units by mouth daily.    Marland Kitchen EVENING PRIMROSE OIL PO Take 1,300 mg by mouth daily.     . metoprolol tartrate (LOPRESSOR) 25 MG tablet TAKE 1 TABLET BY MOUTH 2  TIMES DAILY 180 tablet 0  . Misc Natural Products (OSTEO BI-FLEX JOINT SHIELD) TABS  Take 2 tablets by mouth daily.    . Multiple Minerals-Vitamins (CALCIUM CITRATE PLUS PO) Take 1 tablet by mouth 2 (two) times daily.    . Multiple Vitamin (MULTIVITAMIN WITH MINERALS) TABS tablet Take 1 tablet by mouth daily.    . Omega-3 Fatty Acids (FISH OIL) 1200 MG CPDR Take 1,200 mg by mouth daily.     . pantoprazole (PROTONIX) 40 MG tablet Take 1 tablet (40 mg total) by mouth daily. (Patient taking differently: Take 40 mg by mouth at bedtime. ) 30 tablet 1  . tamoxifen (NOLVADEX) 20 MG tablet TAKE 1 TABLET BY MOUTH  DAILY 90 tablet 2  . vitamin C (ASCORBIC ACID) 500 MG tablet Take 500 mg by mouth daily.    . Melatonin 3 MG CAPS Take 3 mg by mouth at bedtime as needed (Sleep).      Current Facility-Administered Medications  Medication Dose Route Frequency Provider Last Rate Last Dose  . 0.9 %  sodium chloride infusion  500 mL Intravenous Continuous Gatha Mayer, MD        Family History  Problem Relation Age of Onset  . Cancer Mother   . Heart attack Father   . Colon cancer Neg Hx     ROS:  Pertinent items are noted in HPI.  Otherwise, a comprehensive ROS was negative.  Exam:   BP 134/70 (BP Location: Right Arm, Patient Position: Sitting, Cuff Size: Normal)   Pulse 76   Resp 16   Ht 5' 3.25" (1.607 m)   Wt 143 lb (64.9 kg)   LMP 09/05/1985 (Approximate)   BMI 25.13 kg/m      Height: 5' 3.25" (160.7 cm)  Ht Readings from Last 3 Encounters:  02/13/17 5' 3.25" (1.607 m)  12/21/16 5' 3.75" (1.619 m)  10/07/16 5' 3.5" (1.613 m)    General appearance: alert, cooperative and appears stated age Head: Normocephalic, without obvious abnormality, atraumatic Neck: no adenopathy, supple, symmetrical, trachea midline and thyroid normal to inspection and palpation Lungs: clear to auscultation bilaterally Breasts: normal right breast without masses, skin changes, nipple discharge, LAD; left breast with well healed incision, no masses, no skin changes, no LAD Heart: regular rate and  rhythm Abdomen: soft, non-tender; bowel sounds normal; no masses,  no organomegaly Extremities: extremities normal, atraumatic, no cyanosis or edema Skin: Skin color, texture, turgor normal. No rashes or lesions Lymph nodes: Cervical, supraclavicular, and axillary nodes normal. No abnormal inguinal nodes palpated Neurologic: Grossly normal   Pelvic: External genitalia:  Hypopigmented around inner labia majora especially anteriorly near clitoral region              Urethra:  normal appearing urethra with no masses, tenderness or lesions              Bartholins  and Skenes: normal                 Vagina: normal appearing vagina with normal color and discharge, no lesions              Cervix: no lesions              Pap taken: Yes.   Bimanual Exam:  Uterus:  normal size, contour, position, consistency, mobility, non-tender              Adnexa: normal adnexa and no mass, fullness, tenderness               Rectovaginal: Confirms               Anus:  normal sphincter tone, no lesions  Vulvar biopsy recommended.  Consent obtained.  Area for biopsy cleansed with Betadine x 3.  1.0cc 1% plain Lidocaine.  50mm punch biopsy obtained and excision with sterile technique.  Silver nitrate used for excellent hemostasis.  Pt tolerated procedure well.  No dressing applied.  Chaperone was present for exam.  A:  Well Woman with normal exam H/O invasive ductal ca 07/2014, s/p lumpectomy, on Tamoxifen Urinary incontinence, failed PT and medication use Vertebral fracture per pt's hx.  X ray was negative.  Pt reports having MRI (that is not visible in EPIC)  P:   Mammogram guidelines reviewed.  She has a six month MMG follow-up scheduled pap smear obtained today PUS will be scheduled Vulvar biopsy pending Return annually or prn

## 2017-02-14 ENCOUNTER — Telehealth: Payer: Self-pay | Admitting: Obstetrics & Gynecology

## 2017-02-14 LAB — CYTOLOGY - PAP
ADEQUACY: ABSENT
DIAGNOSIS: NEGATIVE

## 2017-02-14 NOTE — Telephone Encounter (Signed)
Call placed to patient to review benefits for a recommended ultrasound. Left voicemail requesting a return call.

## 2017-02-15 ENCOUNTER — Telehealth: Payer: Self-pay

## 2017-02-15 MED ORDER — CLOBETASOL PROPIONATE 0.05 % EX OINT: TOPICAL_OINTMENT | CUTANEOUS | 0 refills | Status: DC

## 2017-02-15 NOTE — Telephone Encounter (Signed)
Patient returning your call.

## 2017-02-15 NOTE — Telephone Encounter (Signed)
-----   Message from Megan Salon, MD sent at 02/15/2017 10:12 AM EDT ----- Please let pt know her pap was normal.  02 recall.  Also let her know her vulvar boipsy showed lichen sclerosus.  She needs to treat this with nightly topical clobetasol ointment 0.05% and recheck in one month with me.

## 2017-02-15 NOTE — Telephone Encounter (Signed)
Left message to call Kaitlyn at [phone]. 

## 2017-02-15 NOTE — Telephone Encounter (Signed)
Spoke with patient. Advised patient of message as seen below from [place]. Patient is agreeable and verbalizes understanding. Rx for Clobetasol ointment 0.05 % place pea sized amount to affected area daily at bedtime #30 gram 0RF sent to pharmacy on file. Recheck scheduled for 03/31/2017 at 9 am with Dr.Miller. Patient is agreeable to date and time.  Routing to provider for final review. Patient agreeable to disposition. Will close encounter.

## 2017-02-16 NOTE — Telephone Encounter (Signed)
Spoke with patient. Patient states that she has irritation around the inside labia majora, but did not notice irritation between the vagina and rectum until Dr.Miller pointed it out to her. Feels she notices it now. Advised if she is having irritation in both locations she will likely need to use more than a pea sized amount which is how rx is written. Patient verbalizes understanding and will use small amount to cover both areas that are irritated.  Routing to provider for final review. Patient agreeable to disposition. Will close encounter.

## 2017-02-16 NOTE — Telephone Encounter (Signed)
Please clarify with pt that this is around the inside of the labia majora and at the tissue between the vagina and rectum and will likely need more than a pea sized amount.  Thanks.

## 2017-02-21 NOTE — Telephone Encounter (Signed)
Returned call to patient to review benefits for scheduled appointment on 02/23/17. Left voicemail requesting a return call

## 2017-02-22 DIAGNOSIS — N390 Urinary tract infection, site not specified: Secondary | ICD-10-CM | POA: Diagnosis not present

## 2017-02-22 DIAGNOSIS — I10 Essential (primary) hypertension: Secondary | ICD-10-CM | POA: Diagnosis not present

## 2017-02-22 DIAGNOSIS — R7309 Other abnormal glucose: Secondary | ICD-10-CM | POA: Diagnosis not present

## 2017-02-22 DIAGNOSIS — E784 Other hyperlipidemia: Secondary | ICD-10-CM | POA: Diagnosis not present

## 2017-02-22 DIAGNOSIS — R8299 Other abnormal findings in urine: Secondary | ICD-10-CM | POA: Diagnosis not present

## 2017-02-22 DIAGNOSIS — M859 Disorder of bone density and structure, unspecified: Secondary | ICD-10-CM | POA: Diagnosis not present

## 2017-02-23 ENCOUNTER — Other Ambulatory Visit: Payer: Self-pay | Admitting: Obstetrics & Gynecology

## 2017-02-23 ENCOUNTER — Ambulatory Visit (INDEPENDENT_AMBULATORY_CARE_PROVIDER_SITE_OTHER): Payer: Medicare Other | Admitting: Obstetrics & Gynecology

## 2017-02-23 ENCOUNTER — Ambulatory Visit (INDEPENDENT_AMBULATORY_CARE_PROVIDER_SITE_OTHER): Payer: Medicare Other

## 2017-02-23 VITALS — BP 118/70 | HR 72 | Resp 16 | Ht 63.25 in | Wt 143.0 lb

## 2017-02-23 DIAGNOSIS — N859 Noninflammatory disorder of uterus, unspecified: Secondary | ICD-10-CM | POA: Diagnosis not present

## 2017-02-23 DIAGNOSIS — N9489 Other specified conditions associated with female genital organs and menstrual cycle: Secondary | ICD-10-CM | POA: Diagnosis not present

## 2017-02-23 DIAGNOSIS — N840 Polyp of corpus uteri: Secondary | ICD-10-CM | POA: Diagnosis not present

## 2017-02-23 DIAGNOSIS — I2510 Atherosclerotic heart disease of native coronary artery without angina pectoris: Secondary | ICD-10-CM

## 2017-02-23 DIAGNOSIS — R9389 Abnormal findings on diagnostic imaging of other specified body structures: Secondary | ICD-10-CM

## 2017-02-23 DIAGNOSIS — N858 Other specified noninflammatory disorders of uterus: Secondary | ICD-10-CM

## 2017-02-23 DIAGNOSIS — R938 Abnormal findings on diagnostic imaging of other specified body structures: Secondary | ICD-10-CM | POA: Diagnosis not present

## 2017-02-23 NOTE — Progress Notes (Signed)
83 y.o. [id number] Marriedfemale here for a pelvic ultrasound with sonohystogram due to abnormal appearing endometrium noted on CT scan 02/02/16.  No follow up was done for findings.  I saw this when reviewing chart at pt's new pt appointment.  Patient's last menstrual period was 09/05/1985 (approximate).  Contraception:  PMP  Technique:  Both transabdominal and transvaginal ultrasound examinations of the pelvis were performed. Transabdominal technique was performed for global imaging of the pelvis including uterus, ovaries, adnexal regions, and pelvic cul-de-sac.  It was necessary to proceed with endovaginal exam following the abdominal ultrasound transabdominal exam to visualize the endometrium and adnexa.  Color and duplex Doppler ultrasound was utilized to evaluate blood flow to the ovaries.    FINDINGS: Uterus: 6.0 x 4.4 x 2.7cm Endometrium: 62mm Adnexa:  Left: 1.7 x 1.2 x 1.6cm     Right: 2.1 x 0.9 x 1.5cm Cul de sac: no free fluid  SHSG:  After obtaining appropriate verbal consent from patient, the cervix was visualized using a speculum, and prepped with betadine.  A tenaculum  was applied to the cervix.  Dilation of the cervix was not necessary. The catheter was passed into the uterus and sterile saline introduced, with the following findings: 33mm and 66mm filling defects c/w polyps noted.  Biopsy not performed as hysteroscopic resection recommended.  Procedure, risks and benefits reviewed.  As pt is not having any issues, she is not completely sure she wants to proceed.  Will discuss with husband.  We will contact her again next week to discuss further planning.  Assessment: Thickened endometrium on CT scan 5/17 and today on PUS Endometrial polyps  Plan:   Have recommended pt proceed with hysteroscopy with polyp resection, D&C.  Will need to communicate with her next week about decision.  ~25 minutes spent with patient >50% of time was in face to face discussion of  above.

## 2017-02-23 NOTE — Telephone Encounter (Signed)
Appointment completed on 02/23/17

## 2017-02-26 ENCOUNTER — Encounter: Payer: Self-pay | Admitting: Obstetrics & Gynecology

## 2017-03-01 DIAGNOSIS — Z1389 Encounter for screening for other disorder: Secondary | ICD-10-CM | POA: Diagnosis not present

## 2017-03-01 DIAGNOSIS — Z9861 Coronary angioplasty status: Secondary | ICD-10-CM | POA: Diagnosis not present

## 2017-03-01 DIAGNOSIS — E784 Other hyperlipidemia: Secondary | ICD-10-CM | POA: Diagnosis not present

## 2017-03-01 DIAGNOSIS — N859 Noninflammatory disorder of uterus, unspecified: Secondary | ICD-10-CM | POA: Diagnosis not present

## 2017-03-01 DIAGNOSIS — R7309 Other abnormal glucose: Secondary | ICD-10-CM | POA: Diagnosis not present

## 2017-03-01 DIAGNOSIS — Z0000 Encounter for general adult medical examination without abnormal findings: Secondary | ICD-10-CM | POA: Diagnosis not present

## 2017-03-01 DIAGNOSIS — I10 Essential (primary) hypertension: Secondary | ICD-10-CM | POA: Diagnosis not present

## 2017-03-01 DIAGNOSIS — D649 Anemia, unspecified: Secondary | ICD-10-CM | POA: Diagnosis not present

## 2017-03-01 DIAGNOSIS — Z6824 Body mass index (BMI) 24.0-24.9, adult: Secondary | ICD-10-CM | POA: Diagnosis not present

## 2017-03-01 DIAGNOSIS — C50919 Malignant neoplasm of unspecified site of unspecified female breast: Secondary | ICD-10-CM | POA: Diagnosis not present

## 2017-03-01 DIAGNOSIS — R630 Anorexia: Secondary | ICD-10-CM | POA: Diagnosis not present

## 2017-03-01 DIAGNOSIS — I779 Disorder of arteries and arterioles, unspecified: Secondary | ICD-10-CM | POA: Diagnosis not present

## 2017-03-02 ENCOUNTER — Other Ambulatory Visit: Payer: Self-pay | Admitting: Endocrinology

## 2017-03-02 DIAGNOSIS — R630 Anorexia: Secondary | ICD-10-CM

## 2017-03-02 DIAGNOSIS — N859 Noninflammatory disorder of uterus, unspecified: Secondary | ICD-10-CM

## 2017-03-02 DIAGNOSIS — C50919 Malignant neoplasm of unspecified site of unspecified female breast: Secondary | ICD-10-CM

## 2017-03-02 DIAGNOSIS — N9489 Other specified conditions associated with female genital organs and menstrual cycle: Secondary | ICD-10-CM

## 2017-03-02 DIAGNOSIS — R109 Unspecified abdominal pain: Secondary | ICD-10-CM

## 2017-03-03 ENCOUNTER — Other Ambulatory Visit: Payer: Medicare Other

## 2017-03-03 ENCOUNTER — Telehealth: Payer: Self-pay | Admitting: Obstetrics & Gynecology

## 2017-03-03 NOTE — Telephone Encounter (Signed)
Patient says she is calling to schedule surgery.

## 2017-03-06 ENCOUNTER — Ambulatory Visit
Admission: RE | Admit: 2017-03-06 | Discharge: 2017-03-06 | Disposition: A | Discharge status: Home / Self Care | Payer: Medicare Other | Source: Ambulatory Visit | Attending: Endocrinology | Admitting: Endocrinology

## 2017-03-06 DIAGNOSIS — N9489 Other specified conditions associated with female genital organs and menstrual cycle: Secondary | ICD-10-CM

## 2017-03-06 DIAGNOSIS — R109 Unspecified abdominal pain: Secondary | ICD-10-CM

## 2017-03-06 DIAGNOSIS — C50919 Malignant neoplasm of unspecified site of unspecified female breast: Secondary | ICD-10-CM

## 2017-03-06 DIAGNOSIS — N858 Other specified noninflammatory disorders of uterus: Secondary | ICD-10-CM | POA: Diagnosis not present

## 2017-03-06 DIAGNOSIS — R630 Anorexia: Secondary | ICD-10-CM

## 2017-03-06 MED ORDER — IOPAMIDOL (ISOVUE-300) INJECTION 61%
100.0000 mL | Freq: Once | INTRAVENOUS | Status: AC | PRN
  Administered 2017-03-06: 100 mL via INTRAVENOUS

## 2017-03-09 ENCOUNTER — Telehealth: Payer: Self-pay | Admitting: Cardiovascular Disease

## 2017-03-09 NOTE — Telephone Encounter (Signed)
Spoke with patient in regards to benefits for recommended surgical procedure. Patient is agreeable, and states she will confirm at her pre-operative appointment. Patient is aware this is the professional benefits only. Patient aware will be contacted by hospital for separate benefits. Forwarding to nurse supervisor for scheduling.   Routing to Lamont Snowball, RN

## 2017-03-09 NOTE — Telephone Encounter (Signed)
Reviewed with Dr Sabra Heck. Need to check with Dr Gwenlyn Found, cardiology, regarding surgical clearance. Call to [place]. Left message for Dr Gwenlyn Found to see if office visit needed for surgical clearance.

## 2017-03-09 NOTE — Telephone Encounter (Signed)
° °  [facility] Pre-operative Risk Assessment    Request for surgical clearance:  1. What type of surgery is being performed? Hysteroscopy D&C  2. When is this surgery scheduled? Pending  Are there any medications that need to be held prior to surgery and how long? Not Sure yet   3. Name of physician performing surgery? Dr. Sabra Heck    4. What is your office phone and fax number? FAX# [phone]  Romana Juniper 03/09/2017, 2:28 PM  _________________________________________________________________   (provider comments below)

## 2017-03-09 NOTE — Telephone Encounter (Signed)
Call to patient to provide update regarding surgery clearance before scheduling. Female states she is not available. Left message to call back at her convenience.

## 2017-03-12 NOTE — Telephone Encounter (Signed)
OK to proceed with hysteroscopy at low risk

## 2017-03-13 NOTE — Telephone Encounter (Signed)
Clearance routed to number provided via EPIC. 

## 2017-03-14 NOTE — Telephone Encounter (Signed)
Returning a call to Jasmin Holloway. °

## 2017-03-14 NOTE — Telephone Encounter (Signed)
Return call to patient. Advised had been waiting for surgical clearance from cardiology. Per note in Epic, Dr Gwenlyn Found has cleared patient for surgery.  Patient is scheduled to travel week of 04-03-17 so would prefer to wait till following week. Will call patient back with date once confirmed.

## 2017-03-17 ENCOUNTER — Other Ambulatory Visit: Payer: Self-pay | Admitting: Obstetrics & Gynecology

## 2017-03-17 NOTE — Telephone Encounter (Signed)
Surgery scheduled for Wednesday 03-29-17 at 0730 at [facility]. Call to patient to review. Patient is out of town. Discussed with husband Fara Olden. Patient to call back on Monday to confirm date and review instructions.

## 2017-03-20 NOTE — Patient Instructions (Addendum)
Your procedure is scheduled on:  Wednesday, March 29, 2017  Enter through the [employer] [facility] at:  6:00 AM  Pick up the phone at the desk and dial [phone].  Call this number if you have problems the morning of surgery: [phone].  Remember: Do NOT eat food or drink after:  Midnight Tuesday  Take these medicines the morning of surgery with a SIP OF WATER:  Benazepril, Metoprolol, Tamoxifen  Stop ALL herbal medications, Omega 3, and Vitamin C at this time  Do NOT smoke the day of surgery.  Do NOT wear jewelry (body piercing), metal hair clips/bobby pins, make-up, artifical eyelashes or nail polish. Do NOT wear lotions, powders, or perfumes.  You may wear deodorant. Do NOT shave for 48 hours prior to surgery. Do NOT bring valuables to the hospital. Contacts, dentures, or bridgework may not be worn into surgery.  Have a responsible adult drive you home and stay with you for 24 hours after your procedure  Bring a copy of your healthcare power of attorney and living will documents.

## 2017-03-20 NOTE — Telephone Encounter (Signed)
Patient returning your call.

## 2017-03-21 ENCOUNTER — Encounter (HOSPITAL_COMMUNITY): Payer: Self-pay

## 2017-03-21 ENCOUNTER — Encounter (HOSPITAL_COMMUNITY)
Admission: RE | Admit: 2017-03-21 | Discharge: 2017-03-21 | Disposition: A | Discharge status: Home / Self Care | Payer: Medicare Other | Source: Ambulatory Visit | Attending: Obstetrics & Gynecology | Admitting: Obstetrics & Gynecology

## 2017-03-21 DIAGNOSIS — Z01818 Encounter for other preprocedural examination: Secondary | ICD-10-CM | POA: Diagnosis not present

## 2017-03-21 DIAGNOSIS — N840 Polyp of corpus uteri: Secondary | ICD-10-CM | POA: Insufficient documentation

## 2017-03-21 DIAGNOSIS — R938 Abnormal findings on diagnostic imaging of other specified body structures: Secondary | ICD-10-CM | POA: Insufficient documentation

## 2017-03-21 HISTORY — DX: Bursopathy, unspecified: M71.9

## 2017-03-21 HISTORY — DX: Headache: R51

## 2017-03-21 HISTORY — DX: Headache, unspecified: R51.9

## 2017-03-21 LAB — BASIC METABOLIC PANEL
Anion gap: 7 (ref 5–15)
BUN: 17 mg/dL (ref 6–20)
CALCIUM: 9.5 mg/dL (ref 8.9–10.3)
CHLORIDE: 105 mmol/L (ref 101–111)
CO2: 27 mmol/L (ref 22–32)
CREATININE: 0.84 mg/dL (ref 0.44–1.00)
GFR calc Af Amer: >60 mL/min (ref >60)
GFR calc non Af Amer: >60 mL/min (ref >60)
Glucose, Bld: 150 mg/dL — ABNORMAL HIGH (ref 65–99)
Potassium: 4.1 mmol/L (ref 3.5–5.1)
Sodium: 139 mmol/L (ref 135–145)

## 2017-03-21 LAB — CBC
HCT: 39.3 % (ref 36.0–46.0)
Hemoglobin: 13.2 g/dL (ref 12.0–15.0)
MCH: 31.9 pg (ref 26.0–34.0)
MCHC: 33.6 g/dL (ref 30.0–36.0)
MCV: 94.9 fL (ref 78.0–100.0)
PLATELETS: 162 10*3/uL (ref 150–400)
RBC: 4.14 MIL/uL (ref 3.87–5.11)
RDW: 14.1 % (ref 11.5–15.5)
WBC: 7.9 10*3/uL (ref 4.0–10.5)

## 2017-03-21 NOTE — Telephone Encounter (Signed)
Forwarding to Dr. Miller.

## 2017-03-21 NOTE — Telephone Encounter (Signed)
Call to patient. Confirmed date and time of surgery of 03-29-17 at 0730 at [facility] patient agreeable. Surgery instruction sheet reviewed and printed copy will be mailed.   Routing to provider for final review. Patient agreeable to disposition. Will close encounter.    Routing to Dr Quincy Simmonds (covering for Dr Sabra Heck out of office.)

## 2017-03-23 ENCOUNTER — Telehealth: Payer: Self-pay

## 2017-03-23 DIAGNOSIS — R7309 Other abnormal glucose: Secondary | ICD-10-CM

## 2017-03-23 NOTE — Telephone Encounter (Signed)
-----   Message from Salvadore Dom, MD sent at 03/21/2017  6:46 PM EDT ----- Please inform the patient that in her pre-op lab work she had an elevated glucose. Can you add a hgbA1C onto the blood work that was drawn? If not she should have one.

## 2017-03-23 NOTE — Telephone Encounter (Signed)
Spoke with patient. Results given. Hemoglobin A1c cannot be added. Lab appointment scheduled for 03/24/2017 at 10:30 am. Patient is agreeable to date and time. Order placed.  Routing to provider for final review. Patient agreeable to disposition. Will close encounter.

## 2017-03-24 ENCOUNTER — Other Ambulatory Visit: Payer: Medicare Other

## 2017-03-24 ENCOUNTER — Other Ambulatory Visit: Payer: Self-pay | Admitting: Obstetrics and Gynecology

## 2017-03-24 DIAGNOSIS — R7309 Other abnormal glucose: Secondary | ICD-10-CM

## 2017-03-25 LAB — HEMOGLOBIN A1C
Est. average glucose Bld gHb Est-mCnc: 131 mg/dL
Hgb A1c MFr Bld: 6.2 % — ABNORMAL HIGH (ref 4.8–5.6)

## 2017-03-27 ENCOUNTER — Telehealth: Payer: Self-pay | Admitting: Obstetrics & Gynecology

## 2017-03-27 ENCOUNTER — Other Ambulatory Visit: Payer: Self-pay | Admitting: Endocrinology

## 2017-03-27 DIAGNOSIS — R921 Mammographic calcification found on diagnostic imaging of breast: Secondary | ICD-10-CM

## 2017-03-27 NOTE — Telephone Encounter (Signed)
Patient is asking to talk with a Gay Filler. Patient said this is urgent and needs to talk with [facility] ASAP. Patient did not receive the surgery information regarding medications before surgery.

## 2017-03-27 NOTE — Telephone Encounter (Signed)
Return call to patient. States she has not received instruction sheet in the mail and is uncertain about the medications she had to stop taking prior to surgery.  Advised instruction sheet was mailed and apologized that hospital mail delivery can take longer to process. Assured her that copy would be provided at appointment tomorrow. Reviewed over the counter medication restrictions and that she may continue Tylenol until morning of surgery.  Patient has appointment with Dr Sabra Heck tomorrow.   Routing to provider for final review. Patient agreeable to disposition. Will close encounter.

## 2017-03-27 NOTE — Progress Notes (Signed)
83 y.o. [id number] MarriedCaucasian female here for discussion of upcoming procedure. Hysteroscopy with polyp resection, D&C planned due to presence of two polyps and current tamoxifen use.  The polyps were noted on ultrasound performed 02/23/17 with ultrasound showing 6 x 4.4 x 2.7cm uterus with 22mm endometrium and SHGM showed two polyps.  Pt had previously undergone CT scan showing endometrial irregularity.  Procedure discussed with patient.  Recovery and pain management discussed.  Risks discussed including but not limited to bleeding, rare risk of transfusion, infection, 1% risk of uterine perforation with risks of fluid deficit causing cardiac arrythmia, cerebral swelling and/or need to stop procedure early.  Fluid emboli and rare risk of death discussed.  DVT/PE, rare risk of risk of bowel/bladder/ureteral/vascular injury.  Patient aware if pathology abnormal she may need additional treatment.  All questions answered.    Pt does have cardiovascular history and had stents placed about 20 years ago.  Dr. Gwenlyn Found gave verbal cardiac clearance due to pt's stability over past many years.    Ob Hx:   Patient's last menstrual period was 09/05/1985 (approximate).          Sexually active: No. Birth control: post menopausal status  Last pap: 02/13/17 negative  Last MMG: 09/20/16 BIRADS 3 probably benign-  6 month follow up  Tobacco: Never smoker  Past Surgical History:  Procedure Laterality Date  . APPENDECTOMY    . BREAST LUMPECTOMY WITH RADIOACTIVE SEED LOCALIZATION Left 08/05/2014   Procedure: SEED LOCALIZED LEFT BREAST LUMPECTOMY;  Surgeon: Excell Seltzer, MD;  Location: [place];  Service: General;  Laterality: Left;  . CATARACT EXTRACTION, BILATERAL    . COLONOSCOPY    . CORONARY ANGIOPLASTY WITH STENT PLACEMENT  11/16/1998   stent to the LAD and PTCA side branch diagonal  . DILATION AND CURETTAGE OF UTERUS    . EYE SURGERY     both cataracts  . NM MYOCAR PERF WALL MOTION   07/13/2009  . RE-EXCISION OF BREAST LUMPECTOMY Left 08/18/2014   Procedure: RE-EXCISION OF LEFT BREAST LUMPECTOMY;  Surgeon: Excell Seltzer, MD;  Location: [facility];  Service: General;  Laterality: Left;  . TONSILLECTOMY      Past Medical History:  Diagnosis Date  . Aortic sclerosis    03/29/13 echo  . Arthritis    HANDS,   . Breast cancer ([place])   . Bursitis    hip  . Cataracts, bilateral   . Coronary artery disease    DR. BERRY IS PT'S CARDIOLOGIST  . GERD (gastroesophageal reflux disease)   . Headache    history of migraines  . History of vertebral fracture   . Hyperlipidemia   . Hypertension   . Mitral regurgitation    mild 03/29/2013 echo  . PONV (postoperative nausea and vomiting)    PT HAD SEVERE NAUSEA WAKING UP IN RR AFTER BREAST LUMPECTOMY  . Rectal bleeding    not sure exactly what date    Allergies: Codeine  Current Outpatient Prescriptions  Medication Sig Dispense Refill  . acetaminophen (TYLENOL) 650 MG CR tablet Take 1,300 mg by mouth every 8 (eight) hours as needed for pain.    Marland Kitchen aspirin EC 81 MG tablet Take 81 mg by mouth every evening.     Marland Kitchen atorvastatin (LIPITOR) 40 MG tablet Take 1 tablet (40 mg total) by mouth daily. (Patient taking differently: Take 40 mg by mouth at bedtime. ) 90 tablet 3  . benazepril-hydrochlorthiazide (LOTENSIN HCT) 10-12.5 MG tablet TAKE 1 TABLET BY MOUTH  DAILY 90 tablet 0  . Biotin 5000 MCG CAPS Take 5,000 mcg by mouth daily.     . cholecalciferol (VITAMIN D) 1000 UNITS tablet Take 1,000 Units by mouth daily.    . clobetasol ointment (TEMOVATE) 0.05 % Apply pea size appointment to affected area daily at bedtime. 30 g 0  . EVENING PRIMROSE OIL PO Take 1,300 mg by mouth daily.     . Melatonin 3 MG CAPS Take 3 mg by mouth at bedtime as needed (Sleep).     . metoprolol tartrate (LOPRESSOR) 25 MG tablet TAKE 1 TABLET BY MOUTH 2  TIMES DAILY 180 tablet 0  . Misc Natural Products (OSTEO BI-FLEX JOINT SHIELD) TABS Take 2 tablets by  mouth daily.    . Multiple Minerals-Vitamins (CALCIUM CITRATE PLUS PO) Take 1 tablet by mouth 2 (two) times daily.    . Multiple Vitamin (MULTIVITAMIN WITH MINERALS) TABS tablet Take 1 tablet by mouth daily.    . naproxen sodium (ALEVE) 220 MG tablet Take 220-440 mg by mouth daily as needed (pain).    . Omega-3 Fatty Acids (FISH OIL) 1200 MG CPDR Take 1,200 mg by mouth daily.     . pantoprazole (PROTONIX) 40 MG tablet Take 1 tablet (40 mg total) by mouth daily. (Patient taking differently: Take 40 mg by mouth at bedtime. ) 30 tablet 1  . tamoxifen (NOLVADEX) 20 MG tablet TAKE 1 TABLET BY MOUTH  DAILY 90 tablet 2  . vitamin C (ASCORBIC ACID) 500 MG tablet Take 500 mg by mouth daily.     No current facility-administered medications for this visit.     ROS: A comprehensive review of systems was negative.  Exam:    BP 118/70 (BP Location: Right Arm, Patient Position: Sitting, Cuff Size: Normal)   Pulse 62   Resp 12   Wt 143 lb 4.8 oz (65 kg)   LMP 09/05/1985 (Approximate)   BMI 24.79 kg/m   General appearance: alert and cooperative Head: Normocephalic, without obvious abnormality, atraumatic Neck: no adenopathy, supple, symmetrical, trachea midline and thyroid not enlarged, symmetric, no tenderness/mass/nodules Lungs: clear to auscultation bilaterally Heart: regular rate and rhythm, S1, S2 normal, no murmur, click, rub or gallop Abdomen: soft, non-tender; bowel sounds normal; no masses,  no organomegaly Extremities: extremities normal, atraumatic, no cyanosis or edema Skin: Skin color, texture, turgor normal. No rashes or lesions Lymph nodes: Cervical, supraclavicular, and axillary nodes normal. no inguinal nodes palpated Neurologic: Grossly normal  Pelvic: not performed  A: Endometrial polyps Tamoxifen use H/O stent placed about 20 years ago    P:  Hysteroscopy with polyp resection, D&C  planned Rx for Vicodin given. Post operative instructions reviewed and given in written  form.

## 2017-03-28 ENCOUNTER — Ambulatory Visit (INDEPENDENT_AMBULATORY_CARE_PROVIDER_SITE_OTHER): Payer: Medicare Other | Admitting: Obstetrics & Gynecology

## 2017-03-28 ENCOUNTER — Encounter: Payer: Self-pay | Admitting: Obstetrics & Gynecology

## 2017-03-28 VITALS — BP 118/70 | HR 62 | Resp 12 | Wt 143.3 lb

## 2017-03-28 DIAGNOSIS — N840 Polyp of corpus uteri: Secondary | ICD-10-CM

## 2017-03-28 MED ORDER — HYDROCODONE-ACETAMINOPHEN 5-325 MG PO TABS: 1.0000 | ORAL_TABLET | Freq: Four times a day (QID) | ORAL | 0 refills | Status: DC | PRN

## 2017-03-29 ENCOUNTER — Ambulatory Visit (HOSPITAL_COMMUNITY)
Admission: RE | Admit: 2017-03-29 | Discharge: 2017-03-29 | Disposition: A | Discharge status: Home / Self Care | Payer: Medicare Other | Source: Ambulatory Visit | Attending: Obstetrics & Gynecology | Admitting: Obstetrics & Gynecology

## 2017-03-29 ENCOUNTER — Ambulatory Visit (HOSPITAL_COMMUNITY): Payer: Medicare Other | Admitting: Certified Registered Nurse Anesthetist

## 2017-03-29 ENCOUNTER — Encounter (HOSPITAL_COMMUNITY)
Admission: RE | Disposition: A | Discharge status: Home / Self Care | Payer: Self-pay | Source: Ambulatory Visit | Attending: Obstetrics & Gynecology

## 2017-03-29 ENCOUNTER — Encounter (HOSPITAL_COMMUNITY): Payer: Self-pay | Admitting: Certified Registered Nurse Anesthetist

## 2017-03-29 DIAGNOSIS — M19041 Primary osteoarthritis, right hand: Secondary | ICD-10-CM | POA: Diagnosis not present

## 2017-03-29 DIAGNOSIS — Z955 Presence of coronary angioplasty implant and graft: Secondary | ICD-10-CM | POA: Insufficient documentation

## 2017-03-29 DIAGNOSIS — Z885 Allergy status to narcotic agent status: Secondary | ICD-10-CM | POA: Insufficient documentation

## 2017-03-29 DIAGNOSIS — G43909 Migraine, unspecified, not intractable, without status migrainosus: Secondary | ICD-10-CM | POA: Insufficient documentation

## 2017-03-29 DIAGNOSIS — K219 Gastro-esophageal reflux disease without esophagitis: Secondary | ICD-10-CM | POA: Insufficient documentation

## 2017-03-29 DIAGNOSIS — Z79899 Other long term (current) drug therapy: Secondary | ICD-10-CM | POA: Diagnosis not present

## 2017-03-29 DIAGNOSIS — Z853 Personal history of malignant neoplasm of breast: Secondary | ICD-10-CM | POA: Diagnosis not present

## 2017-03-29 DIAGNOSIS — N8500 Endometrial hyperplasia, unspecified: Secondary | ICD-10-CM | POA: Diagnosis not present

## 2017-03-29 DIAGNOSIS — Z9841 Cataract extraction status, right eye: Secondary | ICD-10-CM | POA: Insufficient documentation

## 2017-03-29 DIAGNOSIS — Z9842 Cataract extraction status, left eye: Secondary | ICD-10-CM | POA: Diagnosis not present

## 2017-03-29 DIAGNOSIS — I2510 Atherosclerotic heart disease of native coronary artery without angina pectoris: Secondary | ICD-10-CM | POA: Insufficient documentation

## 2017-03-29 DIAGNOSIS — Z809 Family history of malignant neoplasm, unspecified: Secondary | ICD-10-CM | POA: Diagnosis not present

## 2017-03-29 DIAGNOSIS — Z8249 Family history of ischemic heart disease and other diseases of the circulatory system: Secondary | ICD-10-CM | POA: Diagnosis not present

## 2017-03-29 DIAGNOSIS — Z7982 Long term (current) use of aspirin: Secondary | ICD-10-CM | POA: Insufficient documentation

## 2017-03-29 DIAGNOSIS — E785 Hyperlipidemia, unspecified: Secondary | ICD-10-CM | POA: Insufficient documentation

## 2017-03-29 DIAGNOSIS — M19042 Primary osteoarthritis, left hand: Secondary | ICD-10-CM | POA: Insufficient documentation

## 2017-03-29 DIAGNOSIS — I340 Nonrheumatic mitral (valve) insufficiency: Secondary | ICD-10-CM | POA: Diagnosis not present

## 2017-03-29 DIAGNOSIS — I700 Atherosclerosis of aorta: Secondary | ICD-10-CM | POA: Diagnosis not present

## 2017-03-29 DIAGNOSIS — M7070 Other bursitis of hip, unspecified hip: Secondary | ICD-10-CM | POA: Insufficient documentation

## 2017-03-29 DIAGNOSIS — I10 Essential (primary) hypertension: Secondary | ICD-10-CM | POA: Diagnosis not present

## 2017-03-29 DIAGNOSIS — N840 Polyp of corpus uteri: Secondary | ICD-10-CM | POA: Diagnosis not present

## 2017-03-29 DIAGNOSIS — R938 Abnormal findings on diagnostic imaging of other specified body structures: Secondary | ICD-10-CM | POA: Insufficient documentation

## 2017-03-29 HISTORY — PX: DILATATION & CURETTAGE/HYSTEROSCOPY WITH MYOSURE: SHX6511

## 2017-03-29 SURGERY — DILATATION & CURETTAGE/HYSTEROSCOPY WITH MYOSURE
Anesthesia: General | Site: Vagina

## 2017-03-29 MED ORDER — FENTANYL CITRATE (PF) 100 MCG/2ML IJ SOLN
INTRAMUSCULAR | Status: AC
  Filled 2017-03-29: qty 2

## 2017-03-29 MED ORDER — DEXAMETHASONE SODIUM PHOSPHATE 4 MG/ML IJ SOLN
INTRAMUSCULAR | Status: AC
  Filled 2017-03-29: qty 1

## 2017-03-29 MED ORDER — ONDANSETRON HCL 4 MG/2ML IJ SOLN
INTRAMUSCULAR | Status: DC | PRN
  Administered 2017-03-29: 4 mg via INTRAVENOUS

## 2017-03-29 MED ORDER — DEXAMETHASONE SODIUM PHOSPHATE 10 MG/ML IJ SOLN
INTRAMUSCULAR | Status: DC | PRN
  Administered 2017-03-29: 6 mg via INTRAVENOUS

## 2017-03-29 MED ORDER — ONDANSETRON HCL 4 MG/2ML IJ SOLN
INTRAMUSCULAR | Status: AC
  Filled 2017-03-29: qty 2

## 2017-03-29 MED ORDER — LIDOCAINE HCL 1 % IJ SOLN
INTRAMUSCULAR | Status: AC
  Filled 2017-03-29: qty 20

## 2017-03-29 MED ORDER — FENTANYL CITRATE (PF) 100 MCG/2ML IJ SOLN
INTRAMUSCULAR | Status: DC | PRN
  Administered 2017-03-29: 25 ug via INTRAVENOUS

## 2017-03-29 MED ORDER — PROPOFOL 10 MG/ML IV BOLUS
INTRAVENOUS | Status: DC | PRN
  Administered 2017-03-29: 120 mg via INTRAVENOUS

## 2017-03-29 MED ORDER — LIDOCAINE HCL (CARDIAC) 20 MG/ML IV SOLN
INTRAVENOUS | Status: DC | PRN
  Administered 2017-03-29: 50 mg via INTRAVENOUS

## 2017-03-29 MED ORDER — LIDOCAINE-EPINEPHRINE 1 %-1:100000 IJ SOLN
INTRAMUSCULAR | Status: AC
  Filled 2017-03-29: qty 1

## 2017-03-29 MED ORDER — LIDOCAINE HCL 1 % IJ SOLN
INTRAMUSCULAR | Status: DC | PRN
  Administered 2017-03-29: 10 mL

## 2017-03-29 MED ORDER — MIDAZOLAM HCL 2 MG/2ML IJ SOLN
INTRAMUSCULAR | Status: AC
  Filled 2017-03-29: qty 2

## 2017-03-29 MED ORDER — ONDANSETRON HCL 4 MG/2ML IJ SOLN: 4.0000 mg | Freq: Once | INTRAMUSCULAR | Status: DC | PRN

## 2017-03-29 MED ORDER — MIDAZOLAM HCL 2 MG/2ML IJ SOLN
INTRAMUSCULAR | Status: DC | PRN
  Administered 2017-03-29: 1 mg via INTRAVENOUS

## 2017-03-29 MED ORDER — PROPOFOL 10 MG/ML IV BOLUS
INTRAVENOUS | Status: AC
  Filled 2017-03-29: qty 20

## 2017-03-29 MED ORDER — PHENYLEPHRINE HCL 10 MG/ML IJ SOLN
INTRAMUSCULAR | Status: DC | PRN
  Administered 2017-03-29: 80 ug via INTRAVENOUS
  Administered 2017-03-29: 40 ug via INTRAVENOUS
  Administered 2017-03-29: 80 ug via INTRAVENOUS
  Administered 2017-03-29 (×2): 40 ug via INTRAVENOUS

## 2017-03-29 MED ORDER — FENTANYL CITRATE (PF) 100 MCG/2ML IJ SOLN: 25.0000 ug | INTRAMUSCULAR | Status: DC | PRN

## 2017-03-29 MED ORDER — SODIUM CHLORIDE 0.9 % IR SOLN
Status: DC | PRN
  Administered 2017-03-29: 3000 mL

## 2017-03-29 MED ORDER — LACTATED RINGERS IV SOLN
INTRAVENOUS | Status: DC
  Administered 2017-03-29: 07:00:00 via INTRAVENOUS

## 2017-03-29 SURGICAL SUPPLY — 18 items
CANISTER SUCT 3000ML PPV (MISCELLANEOUS) ×3 IMPLANT
CATH ROBINSON RED A/P 16FR (CATHETERS) ×3 IMPLANT
CLOTH BEACON ORANGE TIMEOUT ST (SAFETY) ×3 IMPLANT
CONTAINER PREFILL 10% NBF 60ML (FORM) ×3 IMPLANT
DEVICE MYOSURE LITE (MISCELLANEOUS) ×3 IMPLANT
DEVICE MYOSURE REACH (MISCELLANEOUS) IMPLANT
DILATOR CANAL MILEX (MISCELLANEOUS) IMPLANT
FILTER ARTHROSCOPY CONVERTOR (FILTER) ×3 IMPLANT
GLOVE BIOGEL PI IND STRL 7.0 (GLOVE) ×2 IMPLANT
GLOVE BIOGEL PI INDICATOR 7.0 (GLOVE) ×4
GLOVE ECLIPSE 6.5 STRL STRAW (GLOVE) ×3 IMPLANT
GOWN STRL REUS W/TWL LRG LVL3 (GOWN DISPOSABLE) ×6 IMPLANT
PACK VAGINAL MINOR WOMEN LF (CUSTOM PROCEDURE TRAY) ×3 IMPLANT
PAD OB MATERNITY 4.3X12.25 (PERSONAL CARE ITEMS) ×3 IMPLANT
SEAL ROD LENS SCOPE MYOSURE (ABLATOR) ×3 IMPLANT
TOWEL OR 17X24 6PK STRL BLUE (TOWEL DISPOSABLE) ×6 IMPLANT
TUBING AQUILEX INFLOW (TUBING) ×3 IMPLANT
TUBING AQUILEX OUTFLOW (TUBING) ×3 IMPLANT

## 2017-03-29 NOTE — Anesthesia Postprocedure Evaluation (Signed)
Anesthesia Post Note  Patient: Jasmin Holloway  Procedure(s) Performed: Procedure(s) (LRB): DILATATION & CURETTAGE/HYSTEROSCOPY WITH MYOSURE, sampling of endometrium (N/A)     Patient location during evaluation: PACU Anesthesia Type: General Level of consciousness: awake, awake and alert and oriented Pain management: pain level controlled Vital Signs Assessment: post-procedure vital signs reviewed and stable Respiratory status: spontaneous breathing, nonlabored ventilation and respiratory function stable Cardiovascular status: blood pressure returned to baseline Anesthetic complications: no    Last Vitals:  Vitals:   03/29/17 0900 03/29/17 0930  BP:  113/79  Pulse: 64 66  Resp: 15 16  Temp: 36.6 C 36.6 C    Last Pain:  Vitals:   03/29/17 0616  TempSrc: Oral                 [name]

## 2017-03-29 NOTE — Transfer of Care (Signed)
Immediate Anesthesia Transfer of Care Note  Patient: Jasmin Holloway  Procedure(s) Performed: Procedure(s): DILATATION & CURETTAGE/HYSTEROSCOPY WITH MYOSURE, sampling of endometrium (N/A)  Patient Location: PACU  Anesthesia Type:General  Level of Consciousness: awake, alert  and oriented  Airway & Oxygen Therapy: Patient Spontanous Breathing and Patient connected to nasal cannula oxygen  Post-op Assessment: Report given to RN and Post -op Vital signs reviewed and stable  Post vital signs: Reviewed and stable  Last Vitals:  Vitals:   03/29/17 0616  BP: 132/66  Pulse: 65  Resp: 16  Temp: 36.6 C    Last Pain:  Vitals:   03/29/17 0616  TempSrc: Oral      Patients Stated Pain Goal: 3 (35/24/81 8590)  Complications: No apparent anesthesia complications

## 2017-03-29 NOTE — H&P (Signed)
Jasmin Holloway is an 83 y.o. female G3P2 MWF here for hysteroscopy with polyp resection due to the presence to two endometrial polyps measuring 56mm and 29mm.  Pt has been on Tamoxifen.  Irregular endometrium was noted on CT scan and additional evaluation showed these two polyps.  Due to number, size and tamoxifen use, feel resection and pathology is prudent.  Risks and benefits have been discussed and questions answered today.    Pertinent Gynecological History: Menses: post-menopausal Bleeding: none Contraception: post menopausal status DES exposure: denies Blood transfusions: none Sexually transmitted diseases: no past history Previous GYN Procedures: none  Last mammogram: normal Date: 09/20/16 Last pap: normal Date: 02/03/17 OB History: G3, P2   Menstrual History: Patient's last menstrual period was 09/05/1985 (approximate).    Past Medical History:  Diagnosis Date  . Aortic sclerosis    03/29/13 echo  . Arthritis    HANDS,   . Breast cancer ([place])   . Bursitis    hip  . Cataracts, bilateral   . Coronary artery disease    DR. BERRY IS PT'S CARDIOLOGIST  . GERD (gastroesophageal reflux disease)   . Headache    history of migraines  . History of vertebral fracture   . Hyperlipidemia   . Hypertension   . Mitral regurgitation    mild 03/29/2013 echo  . PONV (postoperative nausea and vomiting)    PT HAD SEVERE NAUSEA WAKING UP IN RR AFTER BREAST LUMPECTOMY  . Rectal bleeding    not sure exactly what date    Past Surgical History:  Procedure Laterality Date  . APPENDECTOMY    . BREAST LUMPECTOMY WITH RADIOACTIVE SEED LOCALIZATION Left 08/05/2014   Procedure: SEED LOCALIZED LEFT BREAST LUMPECTOMY;  Surgeon: Excell Seltzer, MD;  Location: [place];  Service: General;  Laterality: Left;  . CATARACT EXTRACTION, BILATERAL    . COLONOSCOPY    . CORONARY ANGIOPLASTY WITH STENT PLACEMENT  11/16/1998   stent to the LAD and PTCA side branch diagonal  . DILATION  AND CURETTAGE OF UTERUS    . EYE SURGERY     both cataracts  . NM MYOCAR PERF WALL MOTION  07/13/2009  . RE-EXCISION OF BREAST LUMPECTOMY Left 08/18/2014   Procedure: RE-EXCISION OF LEFT BREAST LUMPECTOMY;  Surgeon: Excell Seltzer, MD;  Location: [facility];  Service: General;  Laterality: Left;  . TONSILLECTOMY      Family History  Problem Relation Age of Onset  . Cancer Mother   . Heart attack Father   . Colon cancer Neg Hx     Social History:  reports that she has never smoked. She has never used smokeless tobacco. She reports that she drinks about 1.2 oz of alcohol per week . She reports that she does not use drugs.  Allergies:  Allergies  Allergen Reactions  . Codeine Nausea Only    Prescriptions Prior to Admission  Medication Sig Dispense Refill Last Dose  . acetaminophen (TYLENOL) 650 MG CR tablet Take 1,300 mg by mouth every 8 (eight) hours as needed for pain.   03/28/2017 at Unknown time  . aspirin EC 81 MG tablet Take 81 mg by mouth every evening.    Past Week at Unknown time  . atorvastatin (LIPITOR) 40 MG tablet Take 1 tablet (40 mg total) by mouth daily. (Patient taking differently: Take 40 mg by mouth at bedtime. ) 90 tablet 3 03/28/2017 at Unknown time  . benazepril-hydrochlorthiazide (LOTENSIN HCT) 10-12.5 MG tablet TAKE 1 TABLET BY MOUTH  DAILY  90 tablet 0 03/29/2017 at Unknown time  . Biotin 5000 MCG CAPS Take 5,000 mcg by mouth daily.    Past Week at Unknown time  . cholecalciferol (VITAMIN D) 1000 UNITS tablet Take 1,000 Units by mouth daily.   Past Week at Unknown time  . clobetasol ointment (TEMOVATE) 0.05 % Apply pea size appointment to affected area daily at bedtime. 30 g 0 Past Week at Unknown time  . EVENING PRIMROSE OIL PO Take 1,300 mg by mouth daily.    Past Week at Unknown time  . metoprolol tartrate (LOPRESSOR) 25 MG tablet TAKE 1 TABLET BY MOUTH 2  TIMES DAILY 180 tablet 0 03/29/2017 at Unknown time  . Misc Natural Products (OSTEO BI-FLEX JOINT SHIELD)  TABS Take 2 tablets by mouth daily.   Past Week at Unknown time  . Multiple Minerals-Vitamins (CALCIUM CITRATE PLUS PO) Take 1 tablet by mouth 2 (two) times daily.   Past Week at Unknown time  . Multiple Vitamin (MULTIVITAMIN WITH MINERALS) TABS tablet Take 1 tablet by mouth daily.   Past Week at Unknown time  . naproxen sodium (ALEVE) 220 MG tablet Take 220-440 mg by mouth daily as needed (pain).   Past Week at Unknown time  . Omega-3 Fatty Acids (FISH OIL) 1200 MG CPDR Take 1,200 mg by mouth daily.    Past Week at Unknown time  . pantoprazole (PROTONIX) 40 MG tablet Take 1 tablet (40 mg total) by mouth daily. (Patient taking differently: Take 40 mg by mouth at bedtime. ) 30 tablet 1 Past Month at Unknown time  . tamoxifen (NOLVADEX) 20 MG tablet TAKE 1 TABLET BY MOUTH  DAILY 90 tablet 2 03/29/2017 at Unknown time  . vitamin C (ASCORBIC ACID) 500 MG tablet Take 500 mg by mouth daily.   Past Week at Unknown time  . HYDROcodone-acetaminophen (NORCO/VICODIN) 5-325 MG tablet Take 1-2 tablets by mouth every 6 (six) hours as needed for moderate pain or severe pain. 12 tablet 0   . Melatonin 3 MG CAPS Take 3 mg by mouth at bedtime as needed (Sleep).    Unknown at Unknown time    Review of Systems  All other systems reviewed and are negative.   Blood pressure 132/66, pulse 65, temperature 97.9 F (36.6 C), temperature source Oral, resp. rate 16, last menstrual period 09/05/1985, SpO2 97 %. Physical Exam  Constitutional: She is oriented to person, place, and time. She appears well-developed and well-nourished.  Cardiovascular: Normal rate and regular rhythm.   Respiratory: Effort normal and breath sounds normal.  Neurological: She is alert and oriented to person, place, and time.  Skin: Skin is warm and dry.  Psychiatric: She has a normal mood and affect.    No results found for this or any previous visit (from the past 24 hour(s)).  No results found.  Assessment/Plan: 83 yo G3P2 MWF here for  hysteroscopy with polyp resection and D&C due to irregular appearing endometrium on CT, polyp findings with ultrasound, and tamoxifen use.  Procedure reviewed. Questions answered.  Pt here and ready to proceed.  Jasmin Holloway 03/29/2017, 6:59 AM

## 2017-03-29 NOTE — Anesthesia Preprocedure Evaluation (Addendum)
Anesthesia Evaluation  Patient identified by MRN, date of birth, ID band Patient awake    Reviewed: Allergy & Precautions, NPO status , Patient's Chart, lab work & pertinent test results  Airway Mallampati: II  TM Distance: >3 FB Neck ROM: Full    Dental  (+) Dental Advisory Given, Edentulous Upper, Edentulous Lower   Pulmonary    breath sounds clear to auscultation       Cardiovascular hypertension,  Rhythm:Regular Rate:Normal     Neuro/Psych    GI/Hepatic   Endo/Other    Renal/GU      Musculoskeletal   Abdominal   Peds  Hematology   Anesthesia Other Findings   Reproductive/Obstetrics                            Anesthesia Physical Anesthesia Plan  ASA: III  Anesthesia Plan: General   Post-op Pain Management:    Induction: Intravenous  PONV Risk Score and Plan: Ondansetron and Dexamethasone  Airway Management Planned: LMA  Additional Equipment:   Intra-op Plan:   Post-operative Plan:   Informed Consent: I have reviewed the patients History and Physical, chart, labs and discussed the procedure including the risks, benefits and alternatives for the proposed anesthesia with the patient or authorized representative who has indicated his/her understanding and acceptance.   Dental advisory given  Plan Discussed with: CRNA and Anesthesiologist  Anesthesia Plan Comments:         Anesthesia Quick Evaluation

## 2017-03-29 NOTE — Op Note (Signed)
03/29/2017  7:52 AM  PATIENT:  Jasmin Holloway  83 y.o. female  PRE-OPERATIVE DIAGNOSIS:  Thickened endometrium, enndometrial polyp  POST-OPERATIVE DIAGNOSIS:  Thickened endometrium, enndometrial polyp  PROCEDURE:  Procedure(s): DILATATION & CURETTAGE/HYSTEROSCOPY WITH MYOSURE, sampling of endometrium  SURGEON:  Evian Derringer SUZANNE  ASSISTANTS: OR staff   ANESTHESIA:   general  ESTIMATED BLOOD LOSS: 10cc  BLOOD ADMINISTERED:none   FLUIDS: 700cc LR  UOP: 50cc clear UOP drained with I&O cath at beginning of procedure  SPECIMEN:  Endometrial curettings  DISPOSITION OF SPECIMEN:  PATHOLOGY  FINDINGS: irregular appearing endometrium with web-like tissue present at the beginning of the procedure.  DESCRIPTION OF OPERATION: Patient was taken to the operating room.  She is placed in the supine position. SCDs were on her lower extremities and functioning properly. General anesthesia with an LMA was administered without difficulty.  Legs were then placed in the [place] in the low lithotomy position. The legs were lifted to the high lithotomy position and the Betadine prep was used on the inner thighs perineum and vagina x3. Patient was draped in a normal standard fashion. An in and out catheterization with a red rubber Foley catheter was performed. Approximately 50 cc of clear urine was noted. A bivalve speculum was placed the vagina. The anterior lip of the cervix was grasped with single-tooth tenaculum.  A paracervical block of 1% lidocaine mixed one-to-one without epinephrine.  10 cc was used total. The cervix is dilated up to #21 [facility] dilators. The endometrial cavity sounded to 6 cm.   A myosure diagnostic hysteroscope was obtained. NS was used as a hysteroscopic fluid. The hysteroscope was advanced through the endocervical canal into the endometrial cavity. The tubal ostia were noted bilaterally. There were not any polyps but irregular and weblike appearing tissue present.   No polyp resection was needed.  There was originally what appeared to be a septum in the middle of the uterus.  A #1 toothed curette was used to curette the cavity until rough gritty texture was noted in all quadrants. The hysteroscope was used to visualized the cavity again and there irregular appearing tissue was still present in locations.  The myosure lite resection tip was obtained and the entire cavity was sampled with this device.  As well, tissue within the most distal portion of the endocervical canal was sampled as well.  At this point, procedure was ended.  The hysteroscope was removed. The fluid deficit was 75 cc. The tenaculum was removed from the anterior lip of the cervix. The speculum was removed from the vagina. The prep was cleansed of the patient's skin. The legs are positioned back in the supine position. Sponge, lap, needle, initially counts were correct x2. Patient was taken to recovery in stable condition.  COUNTS:  YES  PLAN OF CARE: Transfer to PACU

## 2017-03-29 NOTE — Anesthesia Procedure Notes (Signed)
Procedure Name: LMA Insertion Date/Time: 03/29/2017 7:22 AM Performed by: Bufford Spikes Pre-anesthesia Checklist: Patient identified, Emergency Drugs available, Suction available and Patient being monitored Patient Re-evaluated:Patient Re-evaluated prior to induction Oxygen Delivery Method: Circle system utilized Preoxygenation: Pre-oxygenation with 100% oxygen Induction Type: IV induction Ventilation: Mask ventilation without difficulty LMA: LMA inserted LMA Size: 3.0 Tube type: Oral Number of attempts: 1 Placement Confirmation: ETT inserted through vocal cords under direct vision,  positive ETCO2 and breath sounds checked- equal and bilateral Tube secured with: Tape Dental Injury: Teeth and Oropharynx as per pre-operative assessment

## 2017-03-29 NOTE — Discharge Instructions (Addendum)
Post-surgical Instructions, Outpatient Surgery  You may expect to feel dizzy, weak, and drowsy for as long as 24 hours after receiving the medicine that made you sleep (anesthetic). For the first 24 hours after your surgery:    Do not drive a car, ride a bicycle, participate in physical activities, or take public transportation until you are done taking narcotic pain medicines or as directed by Dr. Sabra Heck.   Do not drink alcohol or take tranquilizers.   Do not take medicine that has not been prescribed by your physicians.   Do not sign important papers or make important decisions while on narcotic pain medicines.   Have a responsible person with you.   CARE OF INCISION  If you have a bandage, you may remove it in one day.  If there are steri-strips or dermabond, just let this loosen on its own.   You may shower on the first day after your surgery.  Do not sit in a tub bath for one week.  Avoid heavy lifting (more than 10 pounds/4.5 kilograms), pushing, or pulling.   Avoid activities that may risk injury to your incisions.   PAIN MANAGEMENT  Tylenol 500mg  (extra strength Tylenol).  Two tablets every six hours.  You can also take the lower dosed Tylenol, 325mg  tablets, as well.       Vicodin 5/325mg .  For more severe pain, take one or two tablets every four to six hours as needed for pain control.  (Remember that narcotic pain medications increase your risk of constipation.  If this becomes a problem, you may take an over the counter stool softener like Colace 100mg  up to four times a day.)  DO'S AND DON'T'S  Do not take a tub bath for one week.  You may shower on the first day after your surgery  Do not do any heavy lifting for one to two weeks.  This increases the chance of bleeding.  Do move around as you feel able.  Stairs are fine.  You may begin to exercise again as you feel able.  Do not lift any weights for two weeks.  Do not put anything in the vagina for two weeks--no  tampons, intercourse, or douching.    REGULAR MEDIATIONS/VITAMINS:  You may restart all of your regular medications as prescribed.  You may restart your aspirin.    You may restart all of your vitamins as you normally take them.    PLEASE CALL OR SEEK MEDICAL CARE IF:  You have persistent nausea and vomiting.   You have trouble eating or drinking.   You have an oral temperature above 100.5.   You have constipation that is not helped by adjusting diet or increasing fluid intake. Pain medicines are a common cause of constipation.   You have heavy vaginal bleeding   Post Anesthesia Home Care Instructions  Activity: Get plenty of rest for the remainder of the day. A responsible individual must stay with you for 24 hours following the procedure.  For the next 24 hours, DO NOT: -Drive a car -Paediatric nurse -Drink alcoholic beverages -Take any medication unless instructed by your physician -Make any legal decisions or sign important papers.  Meals: Start with liquid foods such as gelatin or soup. Progress to regular foods as tolerated. Avoid greasy, spicy, heavy foods. If nausea and/or vomiting occur, drink only clear liquids until the nausea and/or vomiting subsides. Call your physician if vomiting continues.  Special Instructions/Symptoms: Your throat may feel dry or sore from the  anesthesia or the breathing tube placed in your throat during surgery. If this causes discomfort, gargle with warm salt water. The discomfort should disappear within 24 hours.  If you had a scopolamine patch placed behind your ear for the management of post- operative nausea and/or vomiting:  1. The medication in the patch is effective for 72 hours, after which it should be removed.  Wrap patch in a tissue and discard in the trash. Wash hands thoroughly with soap and water. 2. You may remove the patch earlier than 72 hours if you experience unpleasant side effects which may include dry mouth,  dizziness or visual disturbances. 3. Avoid touching the patch. Wash your hands with soap and water after contact with the patch.

## 2017-03-30 ENCOUNTER — Encounter (HOSPITAL_COMMUNITY): Payer: Self-pay | Admitting: Obstetrics & Gynecology

## 2017-03-31 ENCOUNTER — Ambulatory Visit: Payer: Medicare Other | Admitting: Obstetrics & Gynecology

## 2017-04-03 ENCOUNTER — Telehealth: Payer: Self-pay | Admitting: *Deleted

## 2017-04-03 NOTE — Telephone Encounter (Signed)
Message left to return call to Heriberto Stmartin at [phone].    

## 2017-04-03 NOTE — Telephone Encounter (Signed)
-----   Message from Megan Salon, MD sent at 04/02/2017  9:43 PM EDT ----- Please let pt know her HBA1C was elevated an is in the pre-diabetes range.  We will discuss this at her follow-up appt next week as well.

## 2017-04-03 NOTE — Telephone Encounter (Signed)
-----   Message from Megan Salon, MD sent at 04/01/2017  3:51 PM EDT ----- Please let pt know her pathology showed a benign endometrial polyp.  She has follow-up scheduled already.

## 2017-04-10 NOTE — Telephone Encounter (Signed)
Call to patient. Results reviewed with patient as seen below from Dr. Sabra Heck. Patient verbalized understanding. Patient has post op appointment scheduled for Friday 04/14/17 at 1115.   Patient agreeable to disposition. Will close encounter.

## 2017-04-10 NOTE — Telephone Encounter (Deleted)
-----   Message from Megan Salon, MD sent at 04/02/2017  9:43 PM EDT ----- Please let pt know her HBA1C was elevated an is in the pre-diabetes range.  We will discuss this at her follow-up appt next week as well.

## 2017-04-11 ENCOUNTER — Other Ambulatory Visit: Payer: Self-pay | Admitting: Specialist

## 2017-04-11 DIAGNOSIS — S22070D Wedge compression fracture of T9-T10 vertebra, subsequent encounter for fracture with routine healing: Secondary | ICD-10-CM | POA: Diagnosis not present

## 2017-04-11 DIAGNOSIS — Z01812 Encounter for preprocedural laboratory examination: Secondary | ICD-10-CM | POA: Diagnosis not present

## 2017-04-11 DIAGNOSIS — M546 Pain in thoracic spine: Secondary | ICD-10-CM | POA: Diagnosis not present

## 2017-04-11 NOTE — Progress Notes (Signed)
Post Operative Visit  Procedure:DILATATION & CURETTAGE/HYSTEROSCOPY WITH MYOSURE, sampling of endometrium  Days Post-op: 13 days   Subjective: Doing well.  Reports she had spotting for a few days after the procedure.  Pathology reviewed with pt.  Reports she never took anything for pain post-operatively.    Has some unrelated questions.  Reports she saw Dr. Matilde Sprang about some mild urinary   Objective: BP 110/70   Pulse 68   Resp 16   Ht 5' 3.25" (1.607 m)   Wt 143 lb (64.9 kg)   LMP 09/05/1985 (Approximate)   BMI 25.13 kg/m   EXAM General: alert, cooperative and no distress Resp: clear to auscultation bilaterally Cardio: regular rate and rhythm, S1, S2 normal, no murmur, click, rub or gallop GI: soft, non-tender; bowel sounds normal; no masses,  no organomegaly Extremities: extremities normal, atraumatic, no cyanosis or edema Vaginal Bleeding: none  Gyn:  NAEFG, vaginal without lesions, cervix close, no bleeding or discharge, no CMT  Assessment: s/p hysteroscopy with polyp resection, D&C Pre-diabetes OAB H/o breast cancer on Tamoxifen  Plan: Return for AEX Gave recommendations for possible medications for OAB.  Will call if desires rx.  ~15 minutes spent with patient >50% of time was in face to face discussion of above.

## 2017-04-13 ENCOUNTER — Ambulatory Visit
Admission: RE | Admit: 2017-04-13 | Discharge: 2017-04-13 | Disposition: A | Discharge status: Home / Self Care | Payer: Medicare Other | Source: Ambulatory Visit | Attending: Endocrinology | Admitting: Endocrinology

## 2017-04-13 ENCOUNTER — Other Ambulatory Visit: Payer: Self-pay | Admitting: Endocrinology

## 2017-04-13 DIAGNOSIS — R921 Mammographic calcification found on diagnostic imaging of breast: Secondary | ICD-10-CM

## 2017-04-13 DIAGNOSIS — R922 Inconclusive mammogram: Secondary | ICD-10-CM | POA: Diagnosis not present

## 2017-04-14 ENCOUNTER — Ambulatory Visit (INDEPENDENT_AMBULATORY_CARE_PROVIDER_SITE_OTHER): Payer: Medicare Other | Admitting: Obstetrics & Gynecology

## 2017-04-14 ENCOUNTER — Encounter: Payer: Self-pay | Admitting: Obstetrics & Gynecology

## 2017-04-14 VITALS — BP 110/70 | HR 68 | Resp 16 | Ht 63.25 in | Wt 143.0 lb

## 2017-04-14 DIAGNOSIS — I2510 Atherosclerotic heart disease of native coronary artery without angina pectoris: Secondary | ICD-10-CM | POA: Diagnosis not present

## 2017-04-14 DIAGNOSIS — N840 Polyp of corpus uteri: Secondary | ICD-10-CM | POA: Diagnosis not present

## 2017-04-14 DIAGNOSIS — N3281 Overactive bladder: Secondary | ICD-10-CM | POA: Diagnosis not present

## 2017-04-14 DIAGNOSIS — R938 Abnormal findings on diagnostic imaging of other specified body structures: Secondary | ICD-10-CM

## 2017-04-14 DIAGNOSIS — R9389 Abnormal findings on diagnostic imaging of other specified body structures: Secondary | ICD-10-CM

## 2017-04-14 NOTE — Patient Instructions (Addendum)
Myrbetriq 25mg  or 50mg  daily.    Toviaz 4mg  or 8mg .

## 2017-04-17 ENCOUNTER — Telehealth: Payer: Self-pay | Admitting: Obstetrics & Gynecology

## 2017-04-17 NOTE — Telephone Encounter (Signed)
Left message to call Kaidynce Pfister at [phone].  

## 2017-04-17 NOTE — Telephone Encounter (Signed)
Patient called and left a message after hours. She said she was supposed to get a new prescription for a "salve" in case she needed it at her visit with Dr. Sabra Heck on 04/14/17 but she did not see it listed in her after visit summary. I returned the patient's call and let her know I am forwarding her request on to the clinical staff.  [facility] on [address]

## 2017-04-24 ENCOUNTER — Ambulatory Visit
Admission: RE | Admit: 2017-04-24 | Discharge: 2017-04-24 | Disposition: A | Discharge status: Home / Self Care | Payer: Medicare Other | Source: Ambulatory Visit | Attending: Specialist | Admitting: Specialist

## 2017-04-24 DIAGNOSIS — S22070A Wedge compression fracture of T9-T10 vertebra, initial encounter for closed fracture: Secondary | ICD-10-CM | POA: Diagnosis not present

## 2017-04-24 DIAGNOSIS — S22070D Wedge compression fracture of T9-T10 vertebra, subsequent encounter for fracture with routine healing: Secondary | ICD-10-CM

## 2017-04-24 MED ORDER — GADOBENATE DIMEGLUMINE 529 MG/ML IV SOLN
13.0000 mL | Freq: Once | INTRAVENOUS | Status: AC | PRN
  Administered 2017-04-24: 13 mL via INTRAVENOUS

## 2017-04-25 NOTE — Telephone Encounter (Signed)
Spoke with patient. Patient states she discussed prescription options for bladder leakage on 04/14/17. Patient could not remember the names of the two medications recommended, would like to know before requesting prescription. Advised patient would review with Dr. Sabra Heck and return call with recommendations, patient is agreeable.  Dr. Sabra Heck -please advise on RX recommendations for OAB?

## 2017-04-26 ENCOUNTER — Other Ambulatory Visit: Payer: Self-pay | Admitting: Sports Medicine

## 2017-04-26 DIAGNOSIS — S22070A Wedge compression fracture of T9-T10 vertebra, initial encounter for closed fracture: Secondary | ICD-10-CM

## 2017-04-26 NOTE — Telephone Encounter (Signed)
Spoke with patient, advised as seen below per Dr. Sabra Heck. Patient states she will read about medications and return call to request RX. Patient verbalizes understanding.  Patient is agreeable to disposition. Will close encounter.

## 2017-04-26 NOTE — Telephone Encounter (Signed)
The two medications were on her AVS--Myrbetriq 25mg  or 50mg  daily or Toviaz 4mg  or 8mg .

## 2017-05-04 ENCOUNTER — Other Ambulatory Visit (HOSPITAL_COMMUNITY): Payer: Self-pay | Admitting: Interventional Radiology

## 2017-05-04 DIAGNOSIS — M546 Pain in thoracic spine: Secondary | ICD-10-CM

## 2017-05-04 DIAGNOSIS — S22070D Wedge compression fracture of T9-T10 vertebra, subsequent encounter for fracture with routine healing: Secondary | ICD-10-CM

## 2017-05-15 ENCOUNTER — Telehealth (HOSPITAL_COMMUNITY): Payer: Self-pay

## 2017-05-15 NOTE — Telephone Encounter (Signed)
Called to reschedule consult, left message for pt to return call. AW 

## 2017-05-18 ENCOUNTER — Ambulatory Visit (HOSPITAL_COMMUNITY)
Admission: RE | Admit: 2017-05-18 | Discharge: 2017-05-18 | Disposition: A | Discharge status: Home / Self Care | Payer: Medicare Other | Source: Ambulatory Visit | Attending: Interventional Radiology | Admitting: Interventional Radiology

## 2017-05-18 DIAGNOSIS — M549 Dorsalgia, unspecified: Secondary | ICD-10-CM | POA: Diagnosis not present

## 2017-05-18 DIAGNOSIS — M546 Pain in thoracic spine: Secondary | ICD-10-CM

## 2017-05-18 DIAGNOSIS — S22070D Wedge compression fracture of T9-T10 vertebra, subsequent encounter for fracture with routine healing: Secondary | ICD-10-CM

## 2017-05-18 HISTORY — PX: IR RADIOLOGIST EVAL & MGMT: IMG5224

## 2017-05-19 ENCOUNTER — Encounter (HOSPITAL_COMMUNITY): Payer: Self-pay | Admitting: Interventional Radiology

## 2017-05-25 NOTE — Assessment & Plan Note (Signed)
Left breast invasive ductal carcinoma status post lumpectomy 08/05/2014 0.4 cm tumor, T1 aN0 M0 stage IA ER 90% PR 90% HER-2 negative ratio 1.34, Ki-67 10%, started tamoxifen 08/22/2014  Tamoxifen toxicities: Tolerating tamoxifen extremely well without any major problems or concerns. Patient wakes up a few times in the night because she cannot stay asleep Hot flashes not been a major problem occasionally they do keep her awake.  Breast cancer surveillance: 1. Breast exam 05/26/2017 is normal 2. Mammograms 09/20/16: increasing density along the posterior aspect of the previously identified fat necrosis at the resection site. There are few mildly coarse calcifications within this increasing densitydensity Cat C  Return to clinic in 1 year for follow-up

## 2017-05-26 ENCOUNTER — Ambulatory Visit (HOSPITAL_BASED_OUTPATIENT_CLINIC_OR_DEPARTMENT_OTHER): Payer: Medicare Other | Admitting: Hematology and Oncology

## 2017-05-26 ENCOUNTER — Telehealth: Payer: Self-pay | Admitting: Hematology and Oncology

## 2017-05-26 DIAGNOSIS — Z170 Estrogen receptor positive status [ER+]: Secondary | ICD-10-CM | POA: Diagnosis not present

## 2017-05-26 DIAGNOSIS — C50212 Malignant neoplasm of upper-inner quadrant of left female breast: Secondary | ICD-10-CM

## 2017-05-26 DIAGNOSIS — Z79810 Long term (current) use of selective estrogen receptor modulators (SERMs): Secondary | ICD-10-CM | POA: Diagnosis not present

## 2017-05-26 MED ORDER — TAMOXIFEN CITRATE 20 MG PO TABS
20.0000 mg | ORAL_TABLET | Freq: Every day | ORAL | 3 refills | Status: DC
Start: 2017-05-26 — End: 2018-05-25

## 2017-05-26 NOTE — Progress Notes (Signed)
Patient Care Team: Reynold Bowen, MD as PCP - General (Endocrinology) Nicholas Lose, MD as Consulting Physician (Hematology and Oncology) Excell Seltzer, MD as Consulting Physician (General Surgery) Eppie Gibson, MD as Attending Physician (Radiation Oncology) Holley Bouche, NP as Nurse Practitioner (Nurse Practitioner)  DIAGNOSIS:  Encounter Diagnosis  Name Primary?  . Malignant neoplasm of upper-inner quadrant of left breast in female, estrogen receptor positive ([place])     SUMMARY OF ONCOLOGIC HISTORY:   Breast cancer of upper-inner quadrant of left female breast ([place])   07/01/2014 Breast MRI    Left breast 10:00 position 0.8 x 0.7 x 0.6 cm lobulated enhancing mass, liver cysts      07/07/2014 Initial Diagnosis    Left breast invasive ductal carcinoma with DCIS with calcifications, grade 2 with micropapillary features ER 90%, PR 90%, HER-2 negative ratio 1.34, Ki-67 10%      08/05/2014 Surgery    Left breast lumpectomy: 0.4 cm IDC, with DCIS, margins negative      09/05/2014 -  Anti-estrogen oral therapy    Tamoxifen '20mg'$  once daily for 5 years (will complete in 09/2019)       CHIEF COMPLIANT: Follow-up on tamoxifen therapy  INTERVAL HISTORY: Jasmin Holloway is a 83 year old with above-mentioned history of left breast cancer with DCIS underwent lumpectomy and is currently on tamoxifen. Tamoxifen does not appear to be bothering her. She denies any hot flashes or myalgias. She has had problems with sleep. She does wake up several times in the night which she attributes to being on tamoxifen. She denies any lumps or nodules in breast.Patient had done a vertebral fracture in the thoracic vertebra. She continues to have pain issues. She had surgery on that. Most recent thoracic MRI showed stable findings in August 2018.  REVIEW OF SYSTEMS:   Constitutional: Denies fevers, chills or abnormal weight loss Eyes: Denies blurriness of vision Ears, nose, mouth, throat, and  face: Denies mucositis or sore throat Respiratory: Denies cough, dyspnea or wheezes Cardiovascular: Denies palpitation, chest discomfort Gastrointestinal:  Denies nausea, heartburn or change in bowel habits Skin: Denies abnormal skin rashes Lymphatics: Denies new lymphadenopathy or easy bruising Neurological:Denies numbness, tingling or new weaknesses Behavioral/Psych: Mood is stable, no new changes  Extremities: No lower extremity edema Breast:  denies any pain or lumps or nodules in either breasts All other systems were reviewed with the patient and are negative.  I have reviewed the past medical history, past surgical history, social history and family history with the patient and they are unchanged from previous note.  ALLERGIES:  is allergic to codeine.  MEDICATIONS:  Current Outpatient Prescriptions  Medication Sig Dispense Refill  . acetaminophen (TYLENOL) 650 MG CR tablet Take 1,300 mg by mouth every 8 (eight) hours as needed for pain.    Marland Kitchen aspirin EC 81 MG tablet Take 81 mg by mouth every evening.     Marland Kitchen atorvastatin (LIPITOR) 40 MG tablet Take 1 tablet (40 mg total) by mouth daily. (Patient taking differently: Take 40 mg by mouth at bedtime. ) 90 tablet 3  . benazepril-hydrochlorthiazide (LOTENSIN HCT) 10-12.5 MG tablet TAKE 1 TABLET BY MOUTH  DAILY 90 tablet 0  . Biotin 5000 MCG CAPS Take 5,000 mcg by mouth daily.     . cholecalciferol (VITAMIN D) 1000 UNITS tablet Take 1,000 Units by mouth daily.    . clobetasol ointment (TEMOVATE) 0.05 % Apply pea size appointment to affected area daily at bedtime. 30 g 0  . EVENING PRIMROSE OIL PO  Take 1,300 mg by mouth daily.     . Melatonin 3 MG CAPS Take 3 mg by mouth at bedtime as needed (Sleep).     . metoprolol tartrate (LOPRESSOR) 25 MG tablet TAKE 1 TABLET BY MOUTH 2  TIMES DAILY 180 tablet 0  . Misc Natural Products (OSTEO BI-FLEX JOINT SHIELD) TABS Take 2 tablets by mouth daily.    . Multiple Minerals-Vitamins (CALCIUM CITRATE  PLUS PO) Take 1 tablet by mouth 2 (two) times daily.    . Multiple Vitamin (MULTIVITAMIN WITH MINERALS) TABS tablet Take 1 tablet by mouth daily.    . naproxen sodium (ALEVE) 220 MG tablet Take 220-440 mg by mouth daily as needed (pain).    . Omega-3 Fatty Acids (FISH OIL) 1200 MG CPDR Take 1,200 mg by mouth daily.     . pantoprazole (PROTONIX) 40 MG tablet Take 1 tablet (40 mg total) by mouth daily. (Patient taking differently: Take 40 mg by mouth at bedtime. ) 30 tablet 1  . tamoxifen (NOLVADEX) 20 MG tablet Take 1 tablet (20 mg total) by mouth daily. 90 tablet 3  . vitamin C (ASCORBIC ACID) 500 MG tablet Take 500 mg by mouth daily.     No current facility-administered medications for this visit.     PHYSICAL EXAMINATION: ECOG PERFORMANCE STATUS: 1 - Symptomatic but completely ambulatory  Vitals:   05/26/17 1001  BP: 126/68  Pulse: 73  Resp: 16  Temp: 98.2 F (36.8 C)  SpO2: 96%   Filed Weights   05/26/17 1001  Weight: 143 lb 9.6 oz (65.1 kg)    GENERAL:alert, no distress and comfortable SKIN: skin color, texture, turgor are normal, no rashes or significant lesions EYES: normal, Conjunctiva are pink and non-injected, sclera clear OROPHARYNX:no exudate, no erythema and lips, buccal mucosa, and tongue normal  NECK: supple, thyroid normal size, non-tender, without nodularity LYMPH:  no palpable lymphadenopathy in the cervical, axillary or inguinal LUNGS: clear to auscultation and percussion with normal breathing effort HEART: regular rate & rhythm and no murmurs and no lower extremity edema ABDOMEN:abdomen soft, non-tender and normal bowel sounds MUSCULOSKELETAL:no cyanosis of digits and no clubbing  NEURO: alert & oriented x 3 with fluent speech, no focal motor/sensory deficits EXTREMITIES: No lower extremity edema BREAST: No palpable masses or nodules in either right or left breasts. No palpable axillary supraclavicular or infraclavicular adenopathy no breast tenderness or  nipple discharge. (exam performed in the presence of a chaperone)  LABORATORY DATA:  I have reviewed the data as listed   Chemistry      Component Value Date/Time   NA 139 03/21/2017 1015   NA 143 07/16/2014 1242   K 4.1 03/21/2017 1015   K 3.9 07/16/2014 1242   CL 105 03/21/2017 1015   CO2 27 03/21/2017 1015   CO2 27 07/16/2014 1242   BUN 17 03/21/2017 1015   BUN 16.2 07/16/2014 1242   CREATININE 0.84 03/21/2017 1015   CREATININE 0.9 07/16/2014 1242      Component Value Date/Time   CALCIUM 9.5 03/21/2017 1015   CALCIUM 10.5 (H) 07/16/2014 1242   ALKPHOS 41 09/07/2016 0921   ALKPHOS 53 07/16/2014 1242   AST 47 (H) 09/07/2016 0921   AST 30 07/16/2014 1242   ALT 49 09/07/2016 0921   ALT 30 07/16/2014 1242   BILITOT 0.5 09/07/2016 0921   BILITOT 0.69 07/16/2014 1242       Lab Results  Component Value Date   WBC 7.9 03/21/2017   HGB 13.2  03/21/2017   HCT 39.3 03/21/2017   MCV 94.9 03/21/2017   PLT 162 03/21/2017   NEUTROABS 3.8 09/07/2016    ASSESSMENT & PLAN:  Breast cancer of upper-inner quadrant of left female breast ([place]) Left breast invasive ductal carcinoma status post lumpectomy 08/05/2014 0.4 cm tumor, T1 aN0 M0 stage IA ER 90% PR 90% HER-2 negative ratio 1.34, Ki-67 10%, started tamoxifen 08/22/2014  Tamoxifen toxicities: Tolerating tamoxifen extremely well without any major problems or concerns. Patient wakes up a few times in the night because she cannot stay asleep Hot flashes not been a major problem occasionally they do keep her awake.  Breast cancer surveillance: 1. Breast exam 05/26/2017 is normal 2. Mammograms 09/20/16: increasing density along the posterior aspect of the previously identified fat necrosis at the resection site. There are few mildly coarse calcifications within this increasing densitydensity Cat C  Return to clinic in 1 year for follow-up    I spent 25 minutes talking to the patient of which more than half was spent in  counseling and coordination of care.  No orders of the defined types were placed in this encounter.  The patient has a good understanding of the overall plan. she agrees with it. she will call with any problems that may develop before the next visit here.   Rulon Eisenmenger, MD 05/26/17

## 2017-05-26 NOTE — Telephone Encounter (Signed)
Gave patient avs and calendar per 9/21 los

## 2017-06-02 DIAGNOSIS — Z23 Encounter for immunization: Secondary | ICD-10-CM | POA: Diagnosis not present

## 2017-07-04 DIAGNOSIS — D485 Neoplasm of uncertain behavior of skin: Secondary | ICD-10-CM | POA: Diagnosis not present

## 2017-07-04 DIAGNOSIS — L570 Actinic keratosis: Secondary | ICD-10-CM | POA: Diagnosis not present

## 2017-07-04 DIAGNOSIS — L814 Other melanin hyperpigmentation: Secondary | ICD-10-CM | POA: Diagnosis not present

## 2017-07-04 DIAGNOSIS — Z85828 Personal history of other malignant neoplasm of skin: Secondary | ICD-10-CM | POA: Diagnosis not present

## 2017-07-04 DIAGNOSIS — L940 Localized scleroderma [morphea]: Secondary | ICD-10-CM | POA: Diagnosis not present

## 2017-07-04 DIAGNOSIS — L304 Erythema intertrigo: Secondary | ICD-10-CM | POA: Diagnosis not present

## 2017-07-04 DIAGNOSIS — L821 Other seborrheic keratosis: Secondary | ICD-10-CM | POA: Diagnosis not present

## 2017-08-22 DIAGNOSIS — K219 Gastro-esophageal reflux disease without esophagitis: Secondary | ICD-10-CM | POA: Diagnosis not present

## 2017-08-22 DIAGNOSIS — R7309 Other abnormal glucose: Secondary | ICD-10-CM | POA: Diagnosis not present

## 2017-08-22 DIAGNOSIS — C50212 Malignant neoplasm of upper-inner quadrant of left female breast: Secondary | ICD-10-CM | POA: Diagnosis not present

## 2017-08-22 DIAGNOSIS — S22070A Wedge compression fracture of T9-T10 vertebra, initial encounter for closed fracture: Secondary | ICD-10-CM | POA: Diagnosis not present

## 2017-08-22 DIAGNOSIS — Z6824 Body mass index (BMI) 24.0-24.9, adult: Secondary | ICD-10-CM | POA: Diagnosis not present

## 2017-08-22 DIAGNOSIS — E7849 Other hyperlipidemia: Secondary | ICD-10-CM | POA: Diagnosis not present

## 2017-08-22 DIAGNOSIS — N859 Noninflammatory disorder of uterus, unspecified: Secondary | ICD-10-CM | POA: Diagnosis not present

## 2017-08-22 DIAGNOSIS — I10 Essential (primary) hypertension: Secondary | ICD-10-CM | POA: Diagnosis not present

## 2017-08-22 DIAGNOSIS — I2510 Atherosclerotic heart disease of native coronary artery without angina pectoris: Secondary | ICD-10-CM | POA: Diagnosis not present

## 2017-09-08 ENCOUNTER — Other Ambulatory Visit: Payer: Self-pay | Admitting: Endocrinology

## 2017-09-08 DIAGNOSIS — Z853 Personal history of malignant neoplasm of breast: Secondary | ICD-10-CM

## 2017-10-16 ENCOUNTER — Ambulatory Visit
Admission: RE | Admit: 2017-10-16 | Discharge: 2017-10-16 | Disposition: A | Discharge status: Home / Self Care | Payer: Medicare Other | Source: Ambulatory Visit | Attending: Endocrinology | Admitting: Endocrinology

## 2017-10-16 DIAGNOSIS — Z853 Personal history of malignant neoplasm of breast: Secondary | ICD-10-CM

## 2017-10-16 DIAGNOSIS — R921 Mammographic calcification found on diagnostic imaging of breast: Secondary | ICD-10-CM | POA: Diagnosis not present

## 2017-11-01 DIAGNOSIS — H52203 Unspecified astigmatism, bilateral: Secondary | ICD-10-CM | POA: Diagnosis not present

## 2017-11-01 DIAGNOSIS — Z961 Presence of intraocular lens: Secondary | ICD-10-CM | POA: Diagnosis not present

## 2017-11-15 DIAGNOSIS — L570 Actinic keratosis: Secondary | ICD-10-CM | POA: Diagnosis not present

## 2017-11-15 DIAGNOSIS — L821 Other seborrheic keratosis: Secondary | ICD-10-CM | POA: Diagnosis not present

## 2017-11-15 DIAGNOSIS — Z85828 Personal history of other malignant neoplasm of skin: Secondary | ICD-10-CM | POA: Diagnosis not present

## 2018-01-02 ENCOUNTER — Encounter: Payer: Self-pay | Admitting: Cardiovascular Disease

## 2018-01-02 ENCOUNTER — Ambulatory Visit (INDEPENDENT_AMBULATORY_CARE_PROVIDER_SITE_OTHER): Payer: Medicare Other | Admitting: Cardiovascular Disease

## 2018-01-02 VITALS — BP 110/70 | HR 70 | Ht 63.5 in | Wt 142.0 lb

## 2018-01-02 DIAGNOSIS — I10 Essential (primary) hypertension: Secondary | ICD-10-CM

## 2018-01-02 DIAGNOSIS — E7800 Pure hypercholesterolemia, unspecified: Secondary | ICD-10-CM

## 2018-01-02 DIAGNOSIS — I2510 Atherosclerotic heart disease of native coronary artery without angina pectoris: Secondary | ICD-10-CM | POA: Diagnosis not present

## 2018-01-02 NOTE — Assessment & Plan Note (Signed)
History of CAD diagonal branch intervention by Dr. Rollene Fare in 2000.  She has had no recurrent symptoms.

## 2018-01-02 NOTE — Assessment & Plan Note (Signed)
History of essential hypertension with blood pressure measured today at 110/70.  She is on Lotensin/hydrochlorothiazide and metoprolol.  Continue current meds at current dosing.

## 2018-01-02 NOTE — Assessment & Plan Note (Signed)
History of hyperlipidemia on statin therapy followed by her PCP. 

## 2018-01-02 NOTE — Patient Instructions (Signed)
Medication Instructions: Your physician recommends that you continue on your current medications as directed. Please refer to the Current Medication list given to you today.  Labwork: I will request blood work from Dr. Forde Dandy.   Follow-Up: Your physician wants you to follow-up in: 1 year with Dr. Gwenlyn Found. You will receive a reminder letter in the mail two months in advance. If you don't receive a letter, please call our office to schedule the follow-up appointment.  If you need a refill on your cardiac medications before your next appointment, please call your pharmacy.

## 2018-01-02 NOTE — Progress Notes (Signed)
01/02/2018 [place]   01/15/1934  [id number]  Primary Physician Reynold Bowen, MD Primary Cardiologist: Lorretta Harp MD Lupe Carney, [place]  HPI:  Jasmin Holloway is a 84 y.o.  thin appearing married Caucasian female mother of 2 children, grandmother to 2 grandchildren whose husband [name] is also a patient of mine. Last saw her in the office  12/21/2016.The patient was formally a patient of of Dr. Terance Ice. She has a history of CAD status post LAD/diagonal branch intervention by Dr. Rollene Fare back in March of 2000. She has not had an procedure since. Her last Myoview performed in 2000 and was nonischemic.  She and her husband have since [facility] into [facility] and used their exercise facility frequently.Her other problems include history of treated hypertension and hyperlipidemia.  Since I saw her a year ago she has remained completely asymptomatic.     Current Meds  Medication Sig  . acetaminophen (TYLENOL) 650 MG CR tablet Take 1,300 mg by mouth every 8 (eight) hours as needed for pain.  Marland Kitchen aspirin EC 81 MG tablet Take 81 mg by mouth every evening.   Marland Kitchen atorvastatin (LIPITOR) 40 MG tablet Take 1 tablet (40 mg total) by mouth daily. (Patient taking differently: Take 40 mg by mouth at bedtime. )  . benazepril-hydrochlorthiazide (LOTENSIN HCT) 10-12.5 MG tablet TAKE 1 TABLET BY MOUTH  DAILY  . Biotin 5000 MCG CAPS Take 5,000 mcg by mouth daily.   . cholecalciferol (VITAMIN D) 1000 UNITS tablet Take 1,000 Units by mouth daily.  . clobetasol ointment (TEMOVATE) 0.05 % Apply pea size appointment to affected area daily at bedtime.  Marland Kitchen EVENING PRIMROSE OIL PO Take 1,300 mg by mouth daily.   . Melatonin 3 MG CAPS Take 3 mg by mouth at bedtime as needed (Sleep).   . metoprolol tartrate (LOPRESSOR) 25 MG tablet TAKE 1 TABLET BY MOUTH 2  TIMES DAILY  . Misc Natural Products (OSTEO BI-FLEX JOINT SHIELD) TABS Take 2 tablets by mouth daily.  . Multiple  Minerals-Vitamins (CALCIUM CITRATE PLUS PO) Take 1 tablet by mouth 2 (two) times daily.  . Multiple Vitamin (MULTIVITAMIN WITH MINERALS) TABS tablet Take 1 tablet by mouth daily.  . Omega-3 Fatty Acids (FISH OIL) 1200 MG CPDR Take 1,200 mg by mouth daily.   . pantoprazole (PROTONIX) 40 MG tablet Take 1 tablet (40 mg total) by mouth daily. (Patient taking differently: Take 40 mg by mouth at bedtime. )  . tamoxifen (NOLVADEX) 20 MG tablet Take 1 tablet (20 mg total) by mouth daily.  . vitamin C (ASCORBIC ACID) 500 MG tablet Take 500 mg by mouth daily.     Allergies  Allergen Reactions  . Codeine Nausea Only    Social History   Socioeconomic History  . Marital status: Married    Spouse name: Not on file  . Number of children: Not on file  . Years of education: Not on file  . Highest education level: Not on file  Occupational History  . Not on file  Social Needs  . Financial resource strain: Not on file  . Food insecurity:    Worry: Not on file    Inability: Not on file  . Transportation needs:    Medical: Not on file    Non-medical: Not on file  Tobacco Use  . Smoking status: Never Smoker  . Smokeless tobacco: Never Used  Substance and Sexual Activity  . Alcohol use: Yes    Alcohol/week: 2.4  oz    Types: 2 Standard drinks or equivalent, 2 Glasses of wine per week  . Drug use: No  . Sexual activity: Never    Birth control/protection: Post-menopausal  Lifestyle  . Physical activity:    Days per week: Not on file    Minutes per session: Not on file  . Stress: Not on file  Relationships  . Social connections:    Talks on phone: Not on file    Gets together: Not on file    Attends religious service: Not on file    Active member of club or organization: Not on file    Attends meetings of clubs or organizations: Not on file    Relationship status: Not on file  . Intimate partner violence:    Fear of current or ex partner: Not on file    Emotionally abused: Not on file     Physically abused: Not on file    Forced sexual activity: Not on file  Other Topics Concern  . Not on file  Social History Narrative  . Not on file     Review of Systems: General: negative for chills, fever, night sweats or weight changes.  Cardiovascular: negative for chest pain, dyspnea on exertion, edema, orthopnea, palpitations, paroxysmal nocturnal dyspnea or shortness of breath Dermatological: negative for rash Respiratory: negative for cough or wheezing Urologic: negative for hematuria Abdominal: negative for nausea, vomiting, diarrhea, bright red blood per rectum, melena, or hematemesis Neurologic: negative for visual changes, syncope, or dizziness All other systems reviewed and are otherwise negative except as noted above.    Blood pressure 110/70, pulse 70, height 5' 3.5" (1.613 m), weight 142 lb (64.4 kg), last menstrual period 09/05/1985.  General appearance: alert and no distress Neck: no adenopathy, no carotid bruit, no JVD, supple, symmetrical, trachea midline and thyroid not enlarged, symmetric, no tenderness/mass/nodules Lungs: clear to auscultation bilaterally Heart: regular rate and rhythm, S1, S2 normal, no murmur, click, rub or gallop Extremities: extremities normal, atraumatic, no cyanosis or edema Pulses: 2+ and symmetric Skin: Skin color, texture, turgor normal. No rashes or lesions Neurologic: Alert and oriented X 3, normal strength and tone. Normal symmetric reflexes. Normal coordination and gait  EKG sinus rhythm at 70 left axis deviation.  I personally reviewed this EKG.  ASSESSMENT AND PLAN:   Coronary artery disease History of CAD diagonal branch intervention by Dr. Rollene Fare in 2000.  She has had no recurrent symptoms.    Essential hypertension History of essential hypertension with blood pressure measured today at 110/70.  She is on Lotensin/hydrochlorothiazide and metoprolol.  Continue current meds at current dosing.  Hyperlipidemia History  of hyperlipidemia on statin therapy followed by her PCP      Lorretta Harp MD [facility], [facility] 01/02/2018 9:10 AM

## 2018-03-12 DIAGNOSIS — R7309 Other abnormal glucose: Secondary | ICD-10-CM | POA: Diagnosis not present

## 2018-03-12 DIAGNOSIS — E7849 Other hyperlipidemia: Secondary | ICD-10-CM | POA: Diagnosis not present

## 2018-03-12 DIAGNOSIS — M859 Disorder of bone density and structure, unspecified: Secondary | ICD-10-CM | POA: Diagnosis not present

## 2018-03-12 DIAGNOSIS — R82998 Other abnormal findings in urine: Secondary | ICD-10-CM | POA: Diagnosis not present

## 2018-03-14 DIAGNOSIS — Z0000 Encounter for general adult medical examination without abnormal findings: Secondary | ICD-10-CM | POA: Diagnosis not present

## 2018-03-14 DIAGNOSIS — Z1389 Encounter for screening for other disorder: Secondary | ICD-10-CM | POA: Diagnosis not present

## 2018-03-14 DIAGNOSIS — K219 Gastro-esophageal reflux disease without esophagitis: Secondary | ICD-10-CM | POA: Diagnosis not present

## 2018-03-14 DIAGNOSIS — I779 Disorder of arteries and arterioles, unspecified: Secondary | ICD-10-CM | POA: Diagnosis not present

## 2018-03-14 DIAGNOSIS — C50919 Malignant neoplasm of unspecified site of unspecified female breast: Secondary | ICD-10-CM | POA: Diagnosis not present

## 2018-03-14 DIAGNOSIS — Z6824 Body mass index (BMI) 24.0-24.9, adult: Secondary | ICD-10-CM | POA: Diagnosis not present

## 2018-03-14 DIAGNOSIS — M859 Disorder of bone density and structure, unspecified: Secondary | ICD-10-CM | POA: Diagnosis not present

## 2018-03-14 DIAGNOSIS — E7849 Other hyperlipidemia: Secondary | ICD-10-CM | POA: Diagnosis not present

## 2018-03-14 DIAGNOSIS — R7309 Other abnormal glucose: Secondary | ICD-10-CM | POA: Diagnosis not present

## 2018-03-14 DIAGNOSIS — M5416 Radiculopathy, lumbar region: Secondary | ICD-10-CM | POA: Diagnosis not present

## 2018-03-14 DIAGNOSIS — F418 Other specified anxiety disorders: Secondary | ICD-10-CM | POA: Diagnosis not present

## 2018-03-14 DIAGNOSIS — Z9861 Coronary angioplasty status: Secondary | ICD-10-CM | POA: Diagnosis not present

## 2018-03-16 DIAGNOSIS — Z1212 Encounter for screening for malignant neoplasm of rectum: Secondary | ICD-10-CM | POA: Diagnosis not present

## 2018-04-04 DIAGNOSIS — H6123 Impacted cerumen, bilateral: Secondary | ICD-10-CM | POA: Diagnosis not present

## 2018-04-15 IMAGING — MG 2D DIGITAL DIAGNOSTIC UNILATERAL LEFT MAMMOGRAM WITH CAD AND ADJ
8 series · 8 of 16 positions shown · non-contrast
Comparison: Previous exams including most recent diagnostic
mammogram dated 09/20/2016.

CLINICAL DATA: History of left breast cancer in 7169 status post
breast conservation surgery. Patient presents today for a six-month
follow-up of probably benign post lumpectomy changes and fat
necrosis within the inner left breast.

EXAM:
2D DIGITAL DIAGNOSTIC UNILATERAL LEFT MAMMOGRAM WITH CAD AND ADJUNCT
TOMO

[L ML]
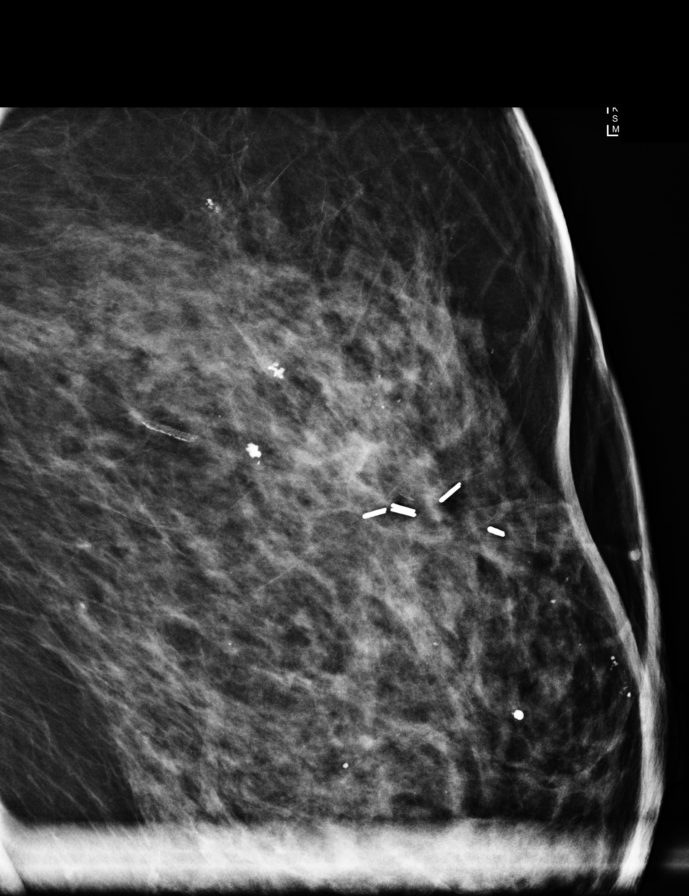

[L CC (1 of 2)]
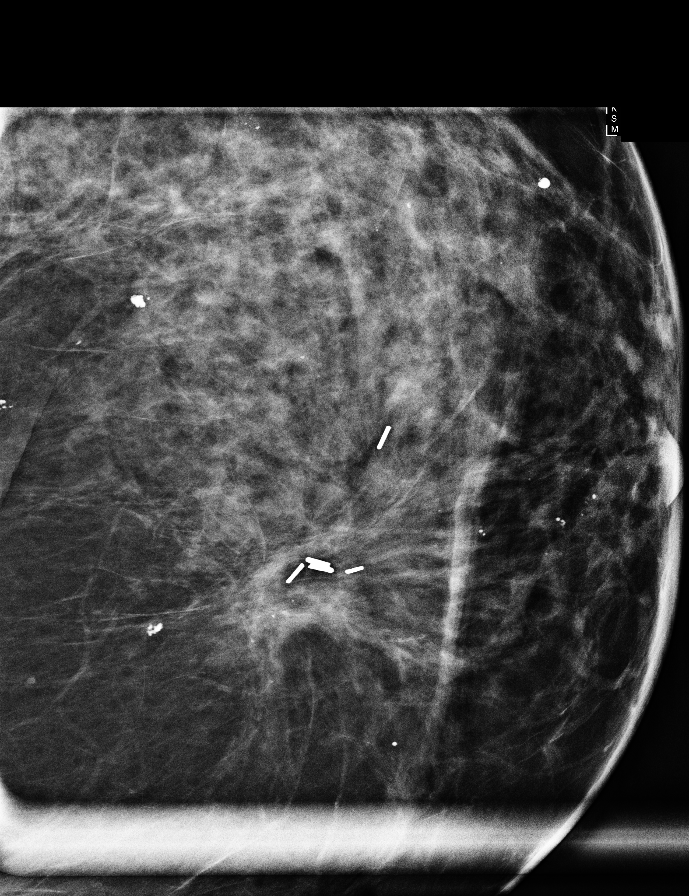

[L MLO]
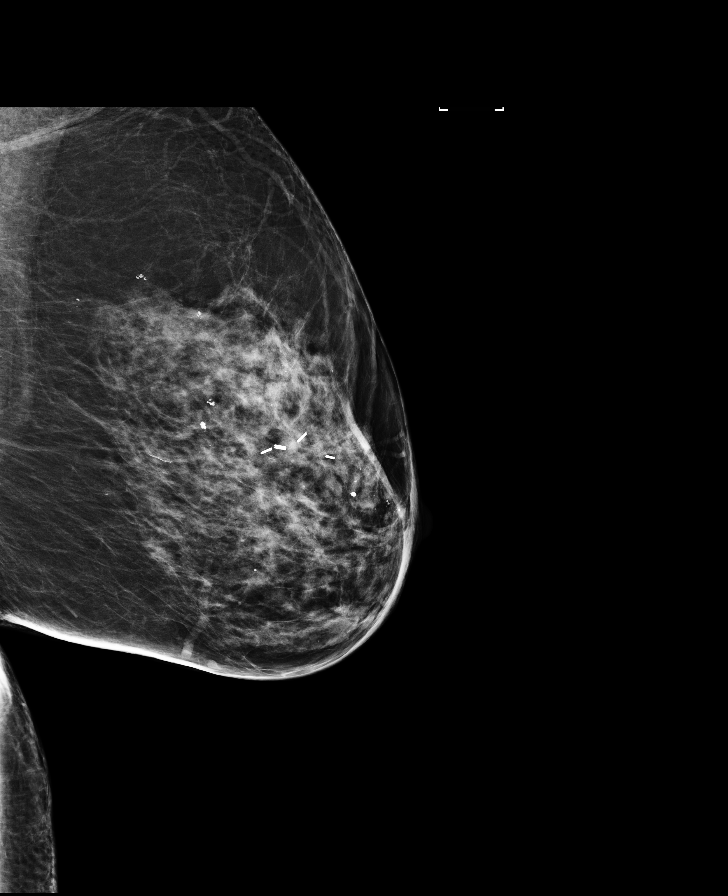

[L CC synth-2D]
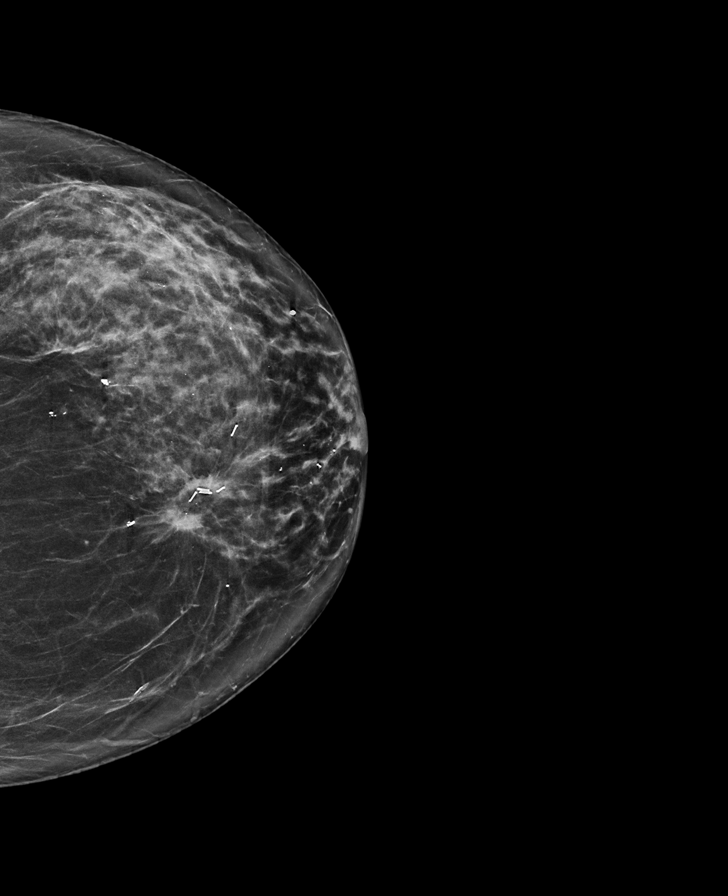

[L MLO synth-2D]
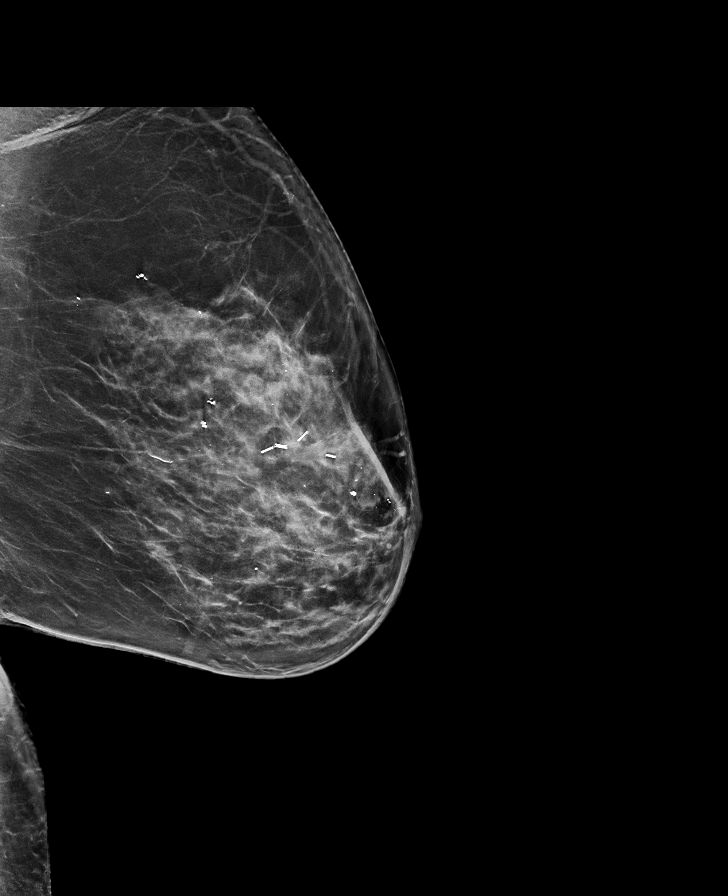

[L CC (2 of 2)]
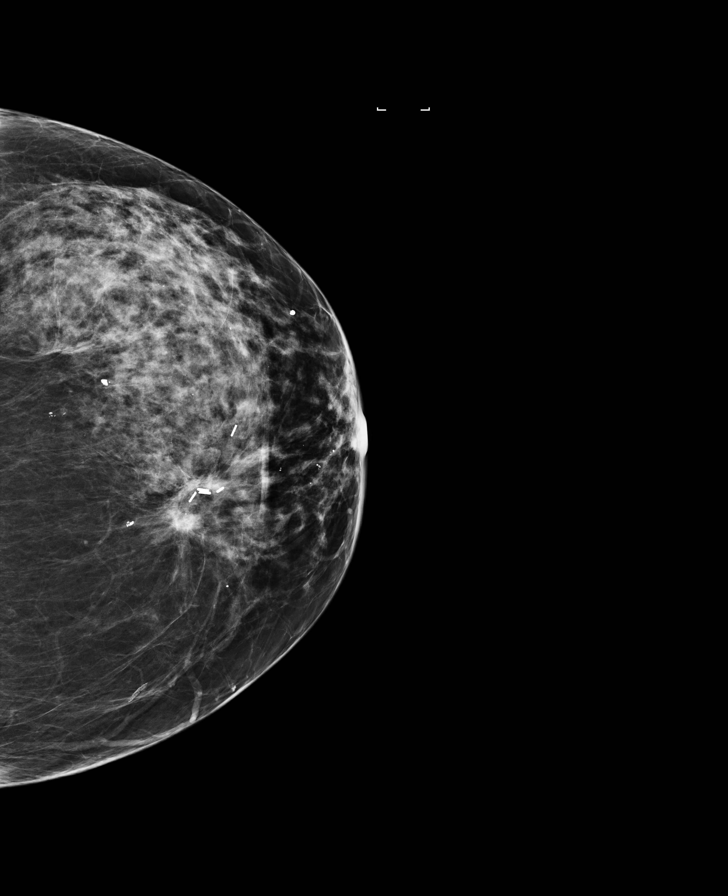

[L MLO tomo · tomo slice 41/82.0]
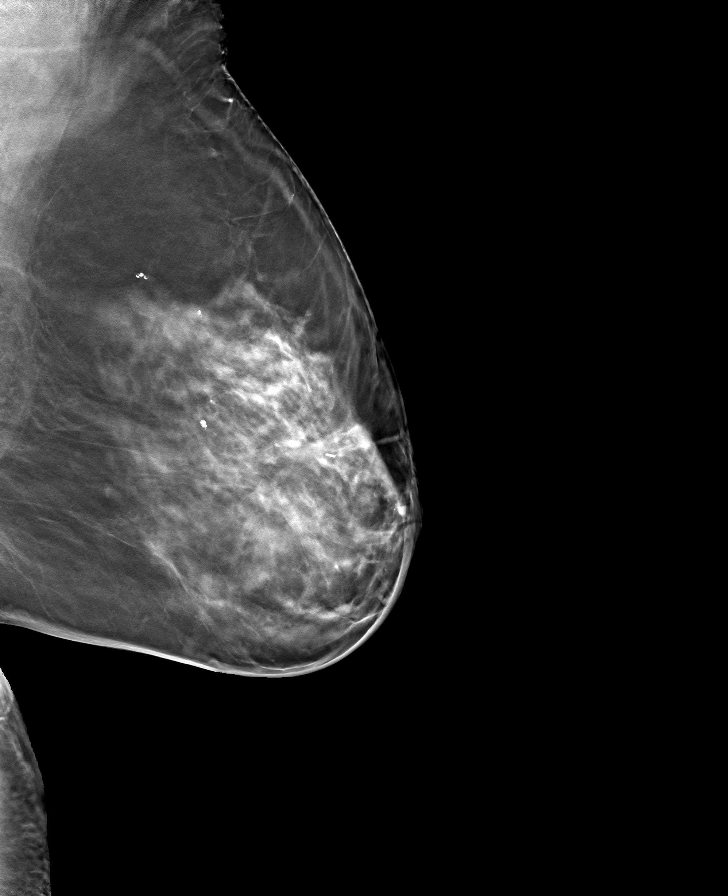

[L CC tomo · tomo slice 34/67.0]
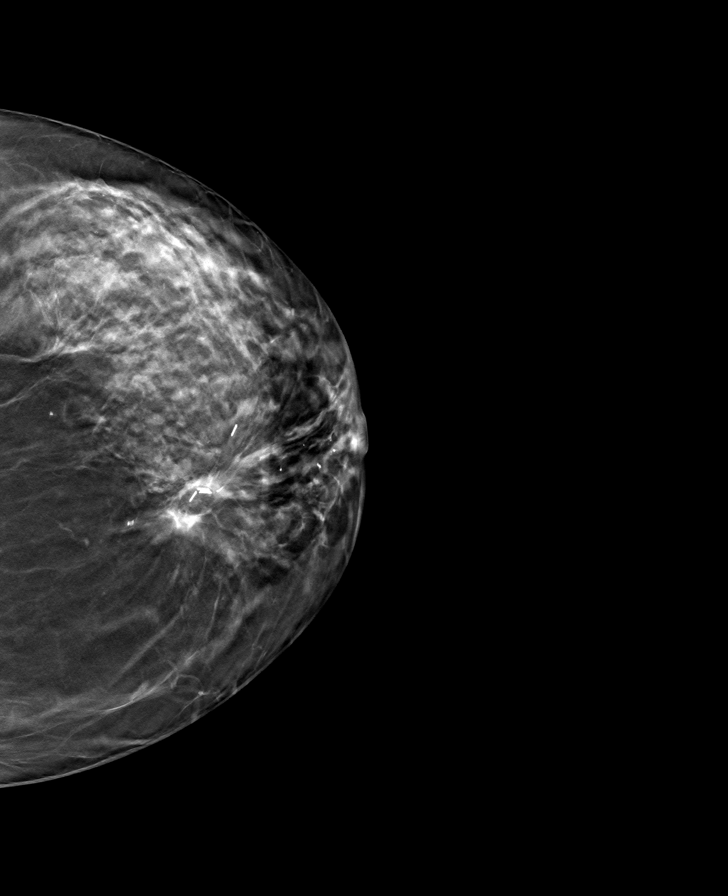

[8 of 16 positions shown; findings below may reference images not displayed]

ACR Breast Density Category c: The breast tissue is heterogeneously
dense, which may obscure small masses.
FINDINGS: There are stable post lumpectomy changes within the upper inner
quadrant. The loosely grouped calcifications adjacent to the
lumpectomy site are also stable.

There are no new dominant masses, suspicious calcifications or
secondary signs of malignancy within the left breast.

Mammographic images were processed with CAD.
IMPRESSION: Stable probably benign post lumpectomy changes, and associated
calcifications, probable fat necrosis, within the upper inner
quadrant of the left breast.

Recommend additional follow-up left breast diagnostic mammogram in 6
months to ensure continued stability. This will be performed as a
bilateral diagnostic mammogram in conjunction with patient's routine
right breast screening mammogram schedule.

RECOMMENDATION:
Bilateral diagnostic mammogram, with left breast magnification
views, in 6 months.

I have discussed the findings and recommendations with the patient.
Results were also provided in writing at the conclusion of the
visit. If applicable, a reminder letter will be sent to the patient
regarding the next appointment.

BI-RADS CATEGORY  3: Probably benign.

## 2018-05-25 ENCOUNTER — Telehealth: Payer: Self-pay | Admitting: Hematology and Oncology

## 2018-05-25 ENCOUNTER — Inpatient Hospital Stay: Payer: Medicare Other | Attending: Hematology and Oncology | Admitting: Hematology and Oncology

## 2018-05-25 DIAGNOSIS — Z7982 Long term (current) use of aspirin: Secondary | ICD-10-CM | POA: Insufficient documentation

## 2018-05-25 DIAGNOSIS — Z170 Estrogen receptor positive status [ER+]: Secondary | ICD-10-CM | POA: Diagnosis not present

## 2018-05-25 DIAGNOSIS — Z79899 Other long term (current) drug therapy: Secondary | ICD-10-CM | POA: Insufficient documentation

## 2018-05-25 DIAGNOSIS — Z79810 Long term (current) use of selective estrogen receptor modulators (SERMs): Secondary | ICD-10-CM | POA: Insufficient documentation

## 2018-05-25 DIAGNOSIS — I2510 Atherosclerotic heart disease of native coronary artery without angina pectoris: Secondary | ICD-10-CM

## 2018-05-25 DIAGNOSIS — C50212 Malignant neoplasm of upper-inner quadrant of left female breast: Secondary | ICD-10-CM | POA: Diagnosis not present

## 2018-05-25 MED ORDER — TAMOXIFEN CITRATE 20 MG PO TABS: 20.0000 mg | ORAL_TABLET | Freq: Every day | ORAL | 3 refills | Status: DC

## 2018-05-25 MED ORDER — TRAMADOL HCL 50 MG PO TABS: 50.0000 mg | ORAL_TABLET | Freq: Four times a day (QID) | ORAL | Status: AC | PRN

## 2018-05-25 NOTE — Assessment & Plan Note (Signed)
Left breast invasive ductal carcinoma status post lumpectomy 08/05/2014 0.4 cm tumor, T1 aN0 M0 stage IA ER 90% PR 90% HER-2 negative ratio 1.34, Ki-67 10%, started tamoxifen 08/22/2014  Tamoxifen toxicities: Tolerating tamoxifen extremely well without any major problems or concerns. Patient wakes up a few times in the night because she cannot stay asleep Hot flashes not been a major problem occasionally they do keep her awake.  Breast cancer surveillance: 1. Breast exam 9/20/2019is normal 2. Mammograms 10/16/2017: Stable loosely grouped calcifications at the left lumpectomy site.   No suspicious findings breast density Cat C  Return to clinic in 1 year for follow-up

## 2018-05-25 NOTE — Telephone Encounter (Signed)
Gave pt avs and calendar  °

## 2018-05-25 NOTE — Progress Notes (Signed)
Patient Care Team: Reynold Bowen, MD as PCP - General (Endocrinology) Nicholas Lose, MD as Consulting Physician (Hematology and Oncology) Excell Seltzer, MD as Consulting Physician (General Surgery) Eppie Gibson, MD as Attending Physician (Radiation Oncology) Holley Bouche, NP as Nurse Practitioner (Nurse Practitioner)  DIAGNOSIS:  Encounter Diagnosis  Name Primary?  . Malignant neoplasm of upper-inner quadrant of left breast in female, estrogen receptor positive ([place])     SUMMARY OF ONCOLOGIC HISTORY:   Breast cancer of upper-inner quadrant of left female breast ([place])   07/01/2014 Breast MRI    Left breast 10:00 position 0.8 x 0.7 x 0.6 cm lobulated enhancing mass, liver cysts    07/07/2014 Initial Diagnosis    Left breast invasive ductal carcinoma with DCIS with calcifications, grade 2 with micropapillary features ER 90%, PR 90%, HER-2 negative ratio 1.34, Ki-67 10%    08/05/2014 Surgery    Left breast lumpectomy: 0.4 cm IDC, with DCIS, margins negative    09/05/2014 -  Anti-estrogen oral therapy    Tamoxifen '20mg'$  once daily for 5 years (will complete in 09/2019)     CHIEF COMPLIANT: Follow-up on tamoxifen therapy  INTERVAL HISTORY: Jasmin Holloway is a 84 year old with above-mentioned history of left breast cancer currently on tamoxifen appears to be tolerating it extremely well.  She denies any hot flashes.  She continues to have trouble sleeping at night.  She was also having back pain for which she took her husband's supply of tramadol and she felt better.  REVIEW OF SYSTEMS:   Constitutional: Denies fevers, chills or abnormal weight loss Eyes: Denies blurriness of vision Ears, nose, mouth, throat, and face: Denies mucositis or sore throat Respiratory: Denies cough, dyspnea or wheezes Cardiovascular: Denies palpitation, chest discomfort Gastrointestinal:  Denies nausea, heartburn or change in bowel habits Skin: Denies abnormal skin rashes Lymphatics:  Denies new lymphadenopathy or easy bruising Neurological:Denies numbness, tingling or new weaknesses Behavioral/Psych: Mood is stable, no new changes  Extremities: No lower extremity edema Breast:  denies any pain or lumps or nodules in either breasts All other systems were reviewed with the patient and are negative.  I have reviewed the past medical history, past surgical history, social history and family history with the patient and they are unchanged from previous note.  ALLERGIES:  is allergic to codeine.  MEDICATIONS:  Current Outpatient Medications  Medication Sig Dispense Refill  . acetaminophen (TYLENOL) 650 MG CR tablet Take 1,300 mg by mouth every 8 (eight) hours as needed for pain.    Marland Kitchen aspirin EC 81 MG tablet Take 81 mg by mouth every evening.     Marland Kitchen atorvastatin (LIPITOR) 40 MG tablet Take 1 tablet (40 mg total) by mouth daily. (Patient taking differently: Take 40 mg by mouth at bedtime. ) 90 tablet 3  . benazepril-hydrochlorthiazide (LOTENSIN HCT) 10-12.5 MG tablet TAKE 1 TABLET BY MOUTH  DAILY 90 tablet 0  . Biotin 5000 MCG CAPS Take 5,000 mcg by mouth daily.     . cholecalciferol (VITAMIN D) 1000 UNITS tablet Take 1,000 Units by mouth daily.    . clobetasol ointment (TEMOVATE) 0.05 % Apply pea size appointment to affected area daily at bedtime. 30 g 0  . EVENING PRIMROSE OIL PO Take 1,300 mg by mouth daily.     . Melatonin 3 MG CAPS Take 3 mg by mouth at bedtime as needed (Sleep).     . metoprolol tartrate (LOPRESSOR) 25 MG tablet TAKE 1 TABLET BY MOUTH 2  TIMES DAILY 180 tablet  0  . Misc Natural Products (OSTEO BI-FLEX JOINT SHIELD) TABS Take 2 tablets by mouth daily.    . Multiple Minerals-Vitamins (CALCIUM CITRATE PLUS PO) Take 1 tablet by mouth 2 (two) times daily.    . Multiple Vitamin (MULTIVITAMIN WITH MINERALS) TABS tablet Take 1 tablet by mouth daily.    . Omega-3 Fatty Acids (FISH OIL) 1200 MG CPDR Take 1,200 mg by mouth daily.     . pantoprazole (PROTONIX) 40 MG  tablet Take 1 tablet (40 mg total) by mouth daily. (Patient taking differently: Take 40 mg by mouth at bedtime. ) 30 tablet 1  . tamoxifen (NOLVADEX) 20 MG tablet Take 1 tablet (20 mg total) by mouth daily. 90 tablet 3  . vitamin C (ASCORBIC ACID) 500 MG tablet Take 500 mg by mouth daily.     No current facility-administered medications for this visit.     PHYSICAL EXAMINATION: ECOG PERFORMANCE STATUS: 1 - Symptomatic but completely ambulatory  Vitals:   05/25/18 1008  BP: 125/67  Pulse: 69  Resp: 16  Temp: 98.1 F (36.7 C)  SpO2: 98%   Filed Weights   05/25/18 1008  Weight: 141 lb 4.8 oz (64.1 kg)    GENERAL:alert, no distress and comfortable SKIN: skin color, texture, turgor are normal, no rashes or significant lesions EYES: normal, Conjunctiva are pink and non-injected, sclera clear OROPHARYNX:no exudate, no erythema and lips, buccal mucosa, and tongue normal  NECK: supple, thyroid normal size, non-tender, without nodularity LYMPH:  no palpable lymphadenopathy in the cervical, axillary or inguinal LUNGS: clear to auscultation and percussion with normal breathing effort HEART: regular rate & rhythm and no murmurs and no lower extremity edema ABDOMEN:abdomen soft, non-tender and normal bowel sounds MUSCULOSKELETAL:no cyanosis of digits and no clubbing  NEURO: alert & oriented x 3 with fluent speech, no focal motor/sensory deficits EXTREMITIES: No lower extremity edema BREAST: No palpable masses or nodules in either right or left breasts. No palpable axillary supraclavicular or infraclavicular adenopathy no breast tenderness or nipple discharge. (exam performed in the presence of a chaperone)  LABORATORY DATA:  I have reviewed the data as listed CMP Latest Ref Rng & Units 03/21/2017 09/08/2016 09/07/2016  Glucose 65 - 99 mg/dL 150(H) 123(H) 128(H)  BUN 6 - 20 mg/dL '17 11 19  '$ Creatinine 0.44 - 1.00 mg/dL 0.84 0.82 0.76  Sodium 135 - 145 mmol/L 139 140 140  Potassium 3.5 - 5.1  mmol/L 4.1 3.7 3.7  Chloride 101 - 111 mmol/L 105 104 106  CO2 22 - 32 mmol/L '27 25 26  '$ Calcium 8.9 - 10.3 mg/dL 9.5 9.0 8.9  Total Protein 6.5 - 8.1 g/dL - - 6.7  Total Bilirubin 0.3 - 1.2 mg/dL - - 0.5  Alkaline Phos 38 - 126 U/L - - 41  AST 15 - 41 U/L - - 47(H)  ALT 14 - 54 U/L - - 49    Lab Results  Component Value Date   WBC 7.9 03/21/2017   HGB 13.2 03/21/2017   HCT 39.3 03/21/2017   MCV 94.9 03/21/2017   PLT 162 03/21/2017   NEUTROABS 3.8 09/07/2016    ASSESSMENT & PLAN:  Breast cancer of upper-inner quadrant of left female breast ([place]) Left breast invasive ductal carcinoma status post lumpectomy 08/05/2014 0.4 cm tumor, T1 aN0 M0 stage IA ER 90% PR 90% HER-2 negative ratio 1.34, Ki-67 10%, started tamoxifen 08/22/2014  Tamoxifen toxicities: Tolerating tamoxifen extremely well without any major problems or concerns. Patient wakes up a few times  in the night because she cannot stay asleep Hot flashes not been a major problem occasionally they do keep her awake. Patient will complete tamoxifen but December 2020.  I discussed with her not to take her husband's pain medication that she should talk with her primary care physician regarding her back issues. Patient lives at [facility] and takes active part in the physical exercise program.  Breast cancer surveillance: 1. Breast exam 9/20/2019is normal 2. Mammograms 10/16/2017: Stable loosely grouped calcifications at the left lumpectomy site.   No suspicious findings breast density Cat C  Return to clinic in 1 year for follow-up   No orders of the defined types were placed in this encounter.  The patient has a good understanding of the overall plan. she agrees with it. she will call with any problems that may develop before the next visit here.   Harriette Ohara, MD 05/25/18

## 2018-06-11 DIAGNOSIS — Z23 Encounter for immunization: Secondary | ICD-10-CM | POA: Diagnosis not present

## 2018-07-19 DIAGNOSIS — K5730 Diverticulosis of large intestine without perforation or abscess without bleeding: Secondary | ICD-10-CM | POA: Diagnosis not present

## 2018-07-19 DIAGNOSIS — K648 Other hemorrhoids: Secondary | ICD-10-CM | POA: Diagnosis not present

## 2018-07-19 DIAGNOSIS — Z1211 Encounter for screening for malignant neoplasm of colon: Secondary | ICD-10-CM | POA: Diagnosis not present

## 2018-07-19 DIAGNOSIS — Z86010 Personal history of colonic polyps: Secondary | ICD-10-CM | POA: Diagnosis not present

## 2018-07-19 DIAGNOSIS — Z853 Personal history of malignant neoplasm of breast: Secondary | ICD-10-CM | POA: Diagnosis not present

## 2018-08-01 DIAGNOSIS — L84 Corns and callosities: Secondary | ICD-10-CM | POA: Diagnosis not present

## 2018-08-01 DIAGNOSIS — L821 Other seborrheic keratosis: Secondary | ICD-10-CM | POA: Diagnosis not present

## 2018-08-01 DIAGNOSIS — L853 Xerosis cutis: Secondary | ICD-10-CM | POA: Diagnosis not present

## 2018-08-01 DIAGNOSIS — L304 Erythema intertrigo: Secondary | ICD-10-CM | POA: Diagnosis not present

## 2018-08-01 DIAGNOSIS — L570 Actinic keratosis: Secondary | ICD-10-CM | POA: Diagnosis not present

## 2018-08-01 DIAGNOSIS — L940 Localized scleroderma [morphea]: Secondary | ICD-10-CM | POA: Diagnosis not present

## 2018-08-01 DIAGNOSIS — Z85828 Personal history of other malignant neoplasm of skin: Secondary | ICD-10-CM | POA: Diagnosis not present

## 2018-09-13 ENCOUNTER — Other Ambulatory Visit: Payer: Self-pay | Admitting: Endocrinology

## 2018-09-13 DIAGNOSIS — Z853 Personal history of malignant neoplasm of breast: Secondary | ICD-10-CM

## 2018-11-05 ENCOUNTER — Ambulatory Visit
Admission: RE | Admit: 2018-11-05 | Discharge: 2018-11-05 | Disposition: A | Discharge status: Home / Self Care | Payer: Medicare Other | Source: Ambulatory Visit | Attending: Endocrinology | Admitting: Endocrinology

## 2018-11-05 DIAGNOSIS — Z853 Personal history of malignant neoplasm of breast: Secondary | ICD-10-CM

## 2019-01-01 ENCOUNTER — Telehealth: Payer: Self-pay | Admitting: Cardiovascular Disease

## 2019-01-01 ENCOUNTER — Telehealth: Payer: Self-pay | Admitting: *Deleted

## 2019-01-01 NOTE — Telephone Encounter (Signed)
Call home phone/ consent/ my chart declined/ pre reg completed

## 2019-01-01 NOTE — Telephone Encounter (Signed)
Cardiac Questionnaire:    Since your last visit or hospitalization:    1. Have you been having new or worsening chest pain? NO   2. Have you been having new or worsening shortness of breath? NO 3. Have you been having new or worsening leg swelling, wt gain, or increase in abdominal girth (pants fitting more tightly)? NO   4. Have you had any passing out spells? NO    *A YES to any of these questions would result in the appointment being kept. *If all the answers to these questions are NO, we should indicate that given the current situation regarding the worldwide coronarvirus pandemic, at the recommendation of the CDC, we are looking to limit gatherings in our waiting area, and thus will reschedule their appointment beyond four weeks from today.   _____________   YJEHU-31 Pre-Screening Questions:  . Do you currently have a fever? NO . Have you recently travelled on a cruise, internationally, or to [place], [place], [place], [place], [place], or [place], [place] [employer]) ? NO . Have you been in contact with someone that is currently pending confirmation of Covid19 testing or has been confirmed to have the [place] virus? NO . Are you currently experiencing fatigue or cough? NO            Virtual Visit Pre-Appointment Phone Call  "(Name), I am calling you today to discuss your upcoming appointment. We are currently trying to limit exposure to the virus that causes COVID-19 by seeing patients at home rather than in the office."  1. "What is the BEST phone number to call the day of the visit?" - include this in appointment notes  2. "Do you have or have access to (through a family member/friend) a smartphone with video capability that we can use for your visit?" a. If yes - list this number in appt notes as "cell" (if different from BEST phone #) and list the appointment type as a VIDEO visit in appointment notes b. If no - list the appointment type as a PHONE visit in appointment notes  3. Confirm  consent - "In the setting of the current Covid19 crisis, you are scheduled for a (phone or video) visit with your provider on (date) at (time).  Just as we do with many in-office visits, in order for you to participate in this visit, we must obtain consent.  If you'd like, I can send this to your mychart (if signed up) or email for you to review.  Otherwise, I can obtain your verbal consent now.  All virtual visits are billed to your insurance company just like a normal visit would be.  By agreeing to a virtual visit, we'd like you to understand that the technology does not allow for your provider to perform an examination, and thus may limit your provider's ability to fully assess your condition. If your provider identifies any concerns that need to be evaluated in person, we will make arrangements to do so.  Finally, though the technology is pretty good, we cannot assure that it will always work on either your or our end, and in the setting of a video visit, we may have to convert it to a phone-only visit.  In either situation, we cannot ensure that we have a secure connection.  Are you willing to proceed?" STAFF: Did the patient verbally acknowledge consent to telehealth visit? Document YES/NO here: YES  4. Advise patient to be prepared - "Two hours prior to your appointment, go ahead and  check your blood pressure, pulse, oxygen saturation, and your weight (if you have the equipment to check those) and write them all down. When your visit starts, your provider will ask you for this information. If you have an Apple Watch or Kardia device, please plan to have heart rate information ready on the day of your appointment. Please have a pen and paper handy nearby the day of the visit as well."  5. Give patient instructions for MyChart download to smartphone OR Doximity/Doxy.me as below if video visit (depending on what platform provider is using)  6. Inform patient they will receive a phone call 15 minutes prior  to their appointment time (may be from unknown caller ID) so they should be prepared to answer    TELEPHONE CALL NOTE  Gittel Mccamish Shuffler has been deemed a candidate for a follow-up tele-health visit to limit community exposure during the Covid-19 pandemic. I spoke with the patient via phone to ensure availability of phone/video source, confirm preferred email & phone number, and discuss instructions and expectations.  I reminded Brittiny Levitz Muehl to be prepared with any vital sign and/or heart rhythm information that could potentially be obtained via home monitoring, at the time of her visit. I reminded Taijah Macrae Rimel to expect a phone call prior to her visit.  Venetia Maxon, CMA 01/01/2019 2:40 PM   INSTRUCTIONS FOR DOWNLOADING THE MYCHART APP TO SMARTPHONE  - The patient must first make sure to have activated MyChart and know their login information - If Apple, go to [employer] and type in MyChart in the search bar and download the app. If Android, ask patient to go to [employer] and type in [place] in the search bar and download the app. The app is free but as with any other app downloads, their phone may require them to verify saved payment information or Apple/Android password.  - The patient will need to then log into the app with their MyChart username and password, and select [facility] as their healthcare provider to link the account. When it is time for your visit, go to the MyChart app, find appointments, and click Begin Video Visit. Be sure to Select Allow for your device to access the Microphone and Camera for your visit. You will then be connected, and your provider will be with you shortly.  **If they have any issues connecting, or need assistance please contact MyChart service desk [phone])**  **If using a computer, in order to ensure the best quality for their visit they will need to use either of the following Internet Browsers: [employer], or Google Chrome**  IF USING DOXIMITY or DOXY.ME - The patient will receive a link just prior to their visit by text.     FULL LENGTH CONSENT FOR TELE-HEALTH VISIT   I hereby voluntarily request, consent and authorize [facility] and its employed or contracted physicians, physician assistants, nurse practitioners or other licensed health care professionals (the Practitioner), to provide me with telemedicine health care services (the "Services") as deemed necessary by the treating Practitioner. I acknowledge and consent to receive the Services by the Practitioner via telemedicine. I understand that the telemedicine visit will involve communicating with the Practitioner through live audiovisual communication technology and the disclosure of certain medical information by electronic transmission. I acknowledge that I have been given the opportunity to request an in-person assessment or other available alternative prior to the telemedicine visit and am voluntarily participating in the telemedicine visit.  I understand that I have the right to withhold or withdraw my consent to the use of telemedicine in the course of my care at any time, without affecting my right to future care or treatment, and that the Practitioner or I may terminate the telemedicine visit at any time. I understand that I have the right to inspect all information obtained and/or recorded in the course of the telemedicine visit and may receive copies of available information for a reasonable fee.  I understand that some of the potential risks of receiving the Services via telemedicine include:  Marland Kitchen Delay or interruption in medical evaluation due to technological equipment failure or disruption; . Information transmitted may not be sufficient (e.g. poor resolution of images) to allow for appropriate medical decision making by the Practitioner; and/or  . In rare instances, security protocols could fail, causing a breach of personal  health information.  Furthermore, I acknowledge that it is my responsibility to provide information about my medical history, conditions and care that is complete and accurate to the best of my ability. I acknowledge that Practitioner's advice, recommendations, and/or decision may be based on factors not within their control, such as incomplete or inaccurate data provided by me or distortions of diagnostic images or specimens that may result from electronic transmissions. I understand that the practice of medicine is not an exact science and that Practitioner makes no warranties or guarantees regarding treatment outcomes. I acknowledge that I will receive a copy of this consent concurrently upon execution via email to the email address I last provided but may also request a printed copy by calling the office of [facility].    I understand that my insurance will be billed for this visit.   I have read or had this consent read to me. . I understand the contents of this consent, which adequately explains the benefits and risks of the Services being provided via telemedicine.  . I have been provided ample opportunity to ask questions regarding this consent and the Services and have had my questions answered to my satisfaction. . I give my informed consent for the services to be provided through the use of telemedicine in my medical care  By participating in this telemedicine visit I agree to the above.

## 2019-01-02 ENCOUNTER — Telehealth (INDEPENDENT_AMBULATORY_CARE_PROVIDER_SITE_OTHER): Payer: Medicare Other | Admitting: Cardiovascular Disease

## 2019-01-02 ENCOUNTER — Telehealth: Payer: Self-pay

## 2019-01-02 ENCOUNTER — Ambulatory Visit: Payer: Medicare Other | Admitting: Cardiovascular Disease

## 2019-01-02 DIAGNOSIS — E782 Mixed hyperlipidemia: Secondary | ICD-10-CM | POA: Diagnosis not present

## 2019-01-02 DIAGNOSIS — I10 Essential (primary) hypertension: Secondary | ICD-10-CM | POA: Diagnosis not present

## 2019-01-02 DIAGNOSIS — I2510 Atherosclerotic heart disease of native coronary artery without angina pectoris: Secondary | ICD-10-CM | POA: Diagnosis not present

## 2019-01-02 NOTE — Telephone Encounter (Signed)
Patient and/or DPR-approved person aware of AVS instructions and verbalized understanding. Letter including After Visit Summary and any other necessary documents to be mailed to the patient's address on file.  

## 2019-01-02 NOTE — Progress Notes (Signed)
Virtual Visit via Video Note   This visit type was conducted due to national recommendations for restrictions regarding the COVID-19 Pandemic (e.g. social distancing) in an effort to limit this patient's exposure and mitigate transmission in our community.  Due to her co-morbid illnesses, this patient is at least at moderate risk for complications without adequate follow up.  This format is felt to be most appropriate for this patient at this time.  All issues noted in this document were discussed and addressed.  A limited physical exam was performed with this format.  Please refer to the patient's chart for her consent to telehealth for [facility].   Evaluation Performed:  Follow-up visit  Date:  01/02/2019   ID:  TALEIGHA Holloway, DOB 1934/07/08, MRN [id number]  Patient Location: Home Provider Location: Home  PCP:  Reynold Bowen, MD  Cardiologist: Dr. Quay Burow Electrophysiologist:  None   Chief Complaint: 1 year follow-up CAD and risk factors  History of Present Illness:    Jasmin Holloway is a 85 y.o.  thin appearing married Caucasian female mother of 2 children, grandmother to 2 grandchildren whose husband [name] is also a patient of mine. Last saw her in the office  01/02/2018.The patient was formally a patient of of Dr. Terance Ice. She has a history of CAD status post LAD/diagonal branch intervention by Dr. Rollene Fare back in March of 2000. She has not had an procedure since. Her last Myoview performed in 2000 and was nonischemic.  She and her husband have since moved into [facility] retirement facility and used their exercise facility frequently.Her other problems include history of treated hypertension and hyperlipidemia.  Since I saw her a year ago she has remained completely asymptomatic.  She specifically denies chest pain or shortness of breath.  She has no COVID-19 symptoms.  The patient does not have symptoms concerning for COVID-19 infection (fever, chills,  cough, or new shortness of breath).    Past Medical History:  Diagnosis Date  . Aortic sclerosis    03/29/13 echo  . Arthritis    HANDS,   . Breast cancer ([facility])   . Bursitis    hip  . Cataracts, bilateral   . Coronary artery disease    DR.  IS PT'S CARDIOLOGIST  . GERD (gastroesophageal reflux disease)   . Headache    history of migraines  . History of vertebral fracture   . Hyperlipidemia   . Hypertension   . Mitral regurgitation    mild 03/29/2013 echo  . PONV (postoperative nausea and vomiting)    PT HAD SEVERE NAUSEA WAKING UP IN RR AFTER BREAST LUMPECTOMY  . Rectal bleeding    not sure exactly what date   Past Surgical History:  Procedure Laterality Date  . APPENDECTOMY    . BREAST LUMPECTOMY Left 08/2014  . BREAST LUMPECTOMY WITH RADIOACTIVE SEED LOCALIZATION Left 08/05/2014   Procedure: SEED LOCALIZED LEFT BREAST LUMPECTOMY;  Surgeon: Excell Seltzer, MD;  Location: [facility];  Service: General;  Laterality: Left;  . CATARACT EXTRACTION, BILATERAL    . COLONOSCOPY    . CORONARY ANGIOPLASTY WITH STENT PLACEMENT  11/16/1998   stent to the LAD and PTCA side branch diagonal  . DILATATION & CURETTAGE/HYSTEROSCOPY WITH MYOSURE N/A 03/29/2017   Procedure: DILATATION & CURETTAGE/HYSTEROSCOPY WITH MYOSURE, sampling of endometrium;  Surgeon: Megan Salon, MD;  Location: [facility];  Service: Gynecology;  Laterality: N/A;  . DILATION AND CURETTAGE OF UTERUS    . EYE SURGERY  both cataracts  . IR RADIOLOGIST EVAL & MGMT  05/18/2017  . NM MYOCAR PERF WALL MOTION  07/13/2009  . RE-EXCISION OF BREAST LUMPECTOMY Left 08/18/2014   Procedure: RE-EXCISION OF LEFT BREAST LUMPECTOMY;  Surgeon: Excell Seltzer, MD;  Location: [facility];  Service: General;  Laterality: Left;  . TONSILLECTOMY       Current Meds  Medication Sig  . aspirin EC 81 MG tablet Take 81 mg by mouth every evening.   Marland Kitchen atorvastatin (LIPITOR) 40 MG tablet Take 1 tablet (40 mg total) by mouth  daily. (Patient taking differently: Take 40 mg by mouth at bedtime. )  . benazepril-hydrochlorthiazide (LOTENSIN HCT) 10-12.5 MG tablet TAKE 1 TABLET BY MOUTH  DAILY  . Biotin 5000 MCG CAPS Take 5,000 mcg by mouth daily.   . cholecalciferol (VITAMIN D) 1000 UNITS tablet Take 1,000 Units by mouth daily.  Marland Kitchen EVENING PRIMROSE OIL PO Take 1,300 mg by mouth daily.   . Melatonin 3 MG CAPS Take 3 mg by mouth at bedtime as needed (Sleep).   . metoprolol tartrate (LOPRESSOR) 25 MG tablet TAKE 1 TABLET BY MOUTH 2  TIMES DAILY  . Misc Natural Products (OSTEO BI-FLEX JOINT SHIELD) TABS Take 2 tablets by mouth daily.  . Multiple Minerals-Vitamins (CALCIUM CITRATE PLUS PO) Take 1 tablet by mouth daily.   . Multiple Vitamin (MULTIVITAMIN WITH MINERALS) TABS tablet Take 1 tablet by mouth daily.  . Omega-3 Fatty Acids (FISH OIL) 1200 MG CPDR Take 1,200 mg by mouth daily.   . pantoprazole (PROTONIX) 40 MG tablet Take 1 tablet (40 mg total) by mouth daily. (Patient taking differently: Take 40 mg by mouth at bedtime. )  . tamoxifen (NOLVADEX) 20 MG tablet Take 1 tablet (20 mg total) by mouth daily.  . traMADol (ULTRAM) 50 MG tablet Take 1 tablet (50 mg total) by mouth every 6 (six) hours as needed.  . vitamin C (ASCORBIC ACID) 500 MG tablet Take 500 mg by mouth daily.  . [DISCONTINUED] acetaminophen (TYLENOL) 650 MG CR tablet Take 1,300 mg by mouth every 8 (eight) hours as needed for pain.  . [DISCONTINUED] clobetasol ointment (TEMOVATE) 0.05 % Apply pea size appointment to affected area daily at bedtime.     Allergies:   Codeine   Social History   Tobacco Use  . Smoking status: Never Smoker  . Smokeless tobacco: Never Used  Substance Use Topics  . Alcohol use: Yes    Alcohol/week: 4.0 standard drinks    Types: 2 Standard drinks or equivalent, 2 Glasses of wine per week  . Drug use: No     Family Hx: The patient's family history includes Cancer in her mother; Heart attack in her father. There is no  history of Colon cancer or Breast cancer.  ROS:   Please see the history of present illness.     All other systems reviewed and are negative.   Prior CV studies:   The following studies were reviewed today:  None  Labs/Other Tests and Data Reviewed:    EKG:  No ECG reviewed.  Recent Labs: No results found for requested labs within last 8760 hours.   Recent Lipid Panel No results found for: CHOL, TRIG, HDL, CHOLHDL, LDLCALC, LDLDIRECT  Wt Readings from Last 3 Encounters:  01/02/19 134 lb (60.8 kg)  05/25/18 141 lb 4.8 oz (64.1 kg)  01/02/18 142 lb (64.4 kg)     Objective:    Vital Signs:  Ht 5' 3.75" (1.619 m)   Wt 134  lb (60.8 kg)   LMP 09/05/1985 (Approximate)   BMI 23.18 kg/m    VITAL SIGNS:  reviewed GEN:  no acute distress RESPIRATORY:  normal respiratory effort, symmetric expansion NEURO:  alert and oriented x 3, no obvious focal deficit PSYCH:  normal affect  ASSESSMENT & PLAN:    1. Coronary artery disease- history of CAD status post LAD/diagonal branch PCI by Dr. Rollene Fare 11/1998.  Her last Myoview performed 06/04/2014 was nonischemic.  She denies chest pain or shortness of breath. 2. Essential hypertension- history of essential hypertension on Lotensin, hydrochlorothiazide and metoprolol.  She does not check her blood pressure at home. 3. Hyperlipidemia- on high-dose atorvastatin with last lipid profile checked 03/14/2018 revealing total cholesterol 114, LDL 46 and HDL of 28.  COVID-19 Education: The signs and symptoms of COVID-19 were discussed with the patient and how to seek care for testing (follow up with PCP or arrange E-visit).  The importance of social distancing was discussed today.  Time:   Today, I have spent 11 minutes with the patient with telehealth technology discussing the above problems.     Medication Adjustments/Labs and Tests Ordered: Current medicines are reviewed at length with the patient today.  Concerns regarding medicines are  outlined above.   Tests Ordered: No orders of the defined types were placed in this encounter.   Medication Changes: No orders of the defined types were placed in this encounter.   Disposition:  Follow up in 1 year(s)  Signed, Quay Burow, MD  01/02/2019 9:52 AM    [facility]

## 2019-01-02 NOTE — Patient Instructions (Signed)

## 2019-03-27 ENCOUNTER — Telehealth: Payer: Self-pay | Admitting: Obstetrics & Gynecology

## 2019-03-27 NOTE — Telephone Encounter (Signed)
Spoke with patient. Patient reports intermittent irritation under both breast. Has been ongoing for approximately 1 yr. Worse when wearing bras, has stopped wearing underwire bras, no change. Also wears a back brace. Skin is occasionally broken. Has been applying OTC A&D ointment and old Rx of "something she has on hand", no changes. Denies itching or breast lumps. Recommended OV for further evaluation, OV scheduled for 7/28 at 4:30pm. Covid 19 precautions reviewed.   Routing to provider for final review. Patient is agreeable to disposition. Will close encounter.

## 2019-03-27 NOTE — Telephone Encounter (Signed)
Patient says underneath her breast is sensitive. Last seen 04/14/2017.

## 2019-04-02 ENCOUNTER — Ambulatory Visit: Payer: Self-pay | Admitting: Obstetrics & Gynecology

## 2019-04-04 ENCOUNTER — Telehealth: Payer: Self-pay | Admitting: Obstetrics & Gynecology

## 2019-04-04 NOTE — Telephone Encounter (Signed)
Spoke with patient and she is complaining of vagina irritation/redness. No discharge or odor. She states Dr.Miller gave her Clobetasol several years ago and this helps when she has irritation, but she is out. Patient requesting refill. Advised she may need office visit. Will discuss with Dr.Miller and call her back.  Patient diagnosed with Lichen Sclerosus on biopsy 02-13-17.  Routed to provider.

## 2019-04-04 NOTE — Telephone Encounter (Signed)
Patient is having irritation in the vaginal area.

## 2019-04-05 ENCOUNTER — Other Ambulatory Visit: Payer: Self-pay | Admitting: Obstetrics & Gynecology

## 2019-04-05 MED ORDER — CLOBETASOL PROPIONATE 0.05 % EX OINT: TOPICAL_OINTMENT | CUTANEOUS | 0 refills | Status: DC

## 2019-04-05 NOTE — Telephone Encounter (Signed)
Rx sent to pharmacy for clobetasol 0.05% ointment.  Apply BID for 14 days.  If symptoms are not fully resolved at that time, she does need to be seen as there is a small increased risk of vulvar cancer with lichen sclerosus and she may need a repeat biopsy.  Thanks.

## 2019-04-05 NOTE — Telephone Encounter (Signed)
Spoke with patient and notified Dr.Miller sent in Rx for Clobetasol to use bid for 14 days. Advised patient if symptoms not fully resolved at that time, she would need to be seen. Explained small increased risk of vulvar cancer with lichen sclerosus. Patient voiced understanding.

## 2019-05-15 ENCOUNTER — Other Ambulatory Visit: Payer: Self-pay | Admitting: Hematology and Oncology

## 2019-05-27 NOTE — Progress Notes (Signed)
Patient Care Team: Reynold Bowen, MD as PCP - General (Endocrinology) Nicholas Lose, MD as Consulting Physician (Hematology and Oncology) Excell Seltzer, MD as Consulting Physician (General Surgery) Eppie Gibson, MD as Attending Physician (Radiation Oncology) Holley Bouche, NP (Inactive) as Nurse Practitioner (Nurse Practitioner)  DIAGNOSIS:    ICD-10-CM   1. Malignant neoplasm of upper-inner quadrant of left breast in female, estrogen receptor positive ([place])  C50.212    Z17.0     SUMMARY OF ONCOLOGIC HISTORY: Oncology History  Breast cancer of upper-inner quadrant of left female breast ([place])  07/01/2014 Breast MRI   Left breast 10:00 position 0.8 x 0.7 x 0.6 cm lobulated enhancing mass, liver cysts   07/07/2014 Initial Diagnosis   Left breast invasive ductal carcinoma with DCIS with calcifications, grade 2 with micropapillary features ER 90%, PR 90%, HER-2 negative ratio 1.34, Ki-67 10%   08/05/2014 Surgery   Left breast lumpectomy: 0.4 cm IDC, with DCIS, margins negative   09/05/2014 -  Anti-estrogen oral therapy   Tamoxifen '20mg'$  once daily for 5 years (will complete in 09/2019)     CHIEF COMPLIANT: Follow-up of left breast cancer on tamoxifen therapy  INTERVAL HISTORY: Jasmin Holloway is a 85 y.o. with above-mentioned history of left breast cancer treated with lumpectomy and who is currently on anti-estrogen therapy with tamoxifen. I last saw her a year ago. Mammogram on 11/05/18 showed no evidence of malignancy bilaterally. She presents to the clinic today for annual follow-up.  She has tolerated tamoxifen extremely well without any problems or concerns.  She does complain of dryness of the skin over the breast.  REVIEW OF SYSTEMS:   Constitutional: Denies fevers, chills or abnormal weight loss Eyes: Denies blurriness of vision Ears, nose, mouth, throat, and face: Denies mucositis or sore throat Respiratory: Denies cough, dyspnea or wheezes Cardiovascular: Denies  palpitation, chest discomfort Gastrointestinal: Denies nausea, heartburn or change in bowel habits Skin: Denies abnormal skin rashes Lymphatics: Denies new lymphadenopathy or easy bruising Neurological: Denies numbness, tingling or new weaknesses Behavioral/Psych: Mood is stable, no new changes  Extremities: No lower extremity edema Breast: Dry skin All other systems were reviewed with the patient and are negative.  I have reviewed the past medical history, past surgical history, social history and family history with the patient and they are unchanged from previous note.  ALLERGIES:  is allergic to codeine.  MEDICATIONS:  Current Outpatient Medications  Medication Sig Dispense Refill  . aspirin EC 81 MG tablet Take 81 mg by mouth every evening.     Marland Kitchen atorvastatin (LIPITOR) 40 MG tablet Take 1 tablet (40 mg total) by mouth daily. (Patient taking differently: Take 40 mg by mouth at bedtime. ) 90 tablet 3  . benazepril-hydrochlorthiazide (LOTENSIN HCT) 10-12.5 MG tablet TAKE 1 TABLET BY MOUTH  DAILY 90 tablet 0  . Biotin 5000 MCG CAPS Take 5,000 mcg by mouth daily.     . cholecalciferol (VITAMIN D) 1000 UNITS tablet Take 1,000 Units by mouth daily.    . clobetasol ointment (TEMOVATE) 0.05 % Apply small amt to affect area twice daily for up to 14 days. 30 g 0  . EVENING PRIMROSE OIL PO Take 1,300 mg by mouth daily.     . Melatonin 3 MG CAPS Take 3 mg by mouth at bedtime as needed (Sleep).     . metoprolol tartrate (LOPRESSOR) 25 MG tablet TAKE 1 TABLET BY MOUTH 2  TIMES DAILY 180 tablet 0  . Misc Natural Products (OSTEO BI-FLEX JOINT  SHIELD) TABS Take 2 tablets by mouth daily.    . Multiple Minerals-Vitamins (CALCIUM CITRATE PLUS PO) Take 1 tablet by mouth daily.     . Multiple Vitamin (MULTIVITAMIN WITH MINERALS) TABS tablet Take 1 tablet by mouth daily.    . Omega-3 Fatty Acids (FISH OIL) 1200 MG CPDR Take 1,200 mg by mouth daily.     . pantoprazole (PROTONIX) 40 MG tablet Take 1 tablet  (40 mg total) by mouth daily. (Patient taking differently: Take 40 mg by mouth at bedtime. ) 30 tablet 1  . tamoxifen (NOLVADEX) 20 MG tablet TAKE 1 TABLET BY MOUTH  DAILY 90 tablet 0  . traMADol (ULTRAM) 50 MG tablet Take 1 tablet (50 mg total) by mouth every 6 (six) hours as needed. 30 tablet   . vitamin C (ASCORBIC ACID) 500 MG tablet Take 500 mg by mouth daily.     No current facility-administered medications for this visit.     PHYSICAL EXAMINATION: ECOG PERFORMANCE STATUS: 1 - Symptomatic but completely ambulatory  Vitals:   05/28/19 0954  BP: 125/61  Pulse: 65  Resp: 16  Temp: 98.2 F (36.8 C)  SpO2: 98%   Filed Weights   05/28/19 0954  Weight: 135 lb 6.4 oz (61.4 kg)    GENERAL: alert, no distress and comfortable SKIN: skin color, texture, turgor are normal, no rashes or significant lesions EYES: normal, Conjunctiva are pink and non-injected, sclera clear OROPHARYNX: no exudate, no erythema and lips, buccal mucosa, and tongue normal  NECK: supple, thyroid normal size, non-tender, without nodularity LYMPH: no palpable lymphadenopathy in the cervical, axillary or inguinal LUNGS: clear to auscultation and percussion with normal breathing effort HEART: regular rate & rhythm and no murmurs and no lower extremity edema ABDOMEN: abdomen soft, non-tender and normal bowel sounds MUSCULOSKELETAL: no cyanosis of digits and no clubbing  NEURO: alert & oriented x 3 with fluent speech, no focal motor/sensory deficits EXTREMITIES: No lower extremity edema BREAST: No palpable masses or nodules in either right or left breasts. No palpable axillary supraclavicular or infraclavicular adenopathy no breast tenderness or nipple discharge. (exam performed in the presence of a chaperone)  LABORATORY DATA:  I have reviewed the data as listed CMP Latest Ref Rng & Units 03/21/2017 09/08/2016 09/07/2016  Glucose 65 - 99 mg/dL 150(H) 123(H) 128(H)  BUN 6 - 20 mg/dL '17 11 19  '$ Creatinine 0.44 - 1.00  mg/dL 0.84 0.82 0.76  Sodium 135 - 145 mmol/L 139 140 140  Potassium 3.5 - 5.1 mmol/L 4.1 3.7 3.7  Chloride 101 - 111 mmol/L 105 104 106  CO2 22 - 32 mmol/L '27 25 26  '$ Calcium 8.9 - 10.3 mg/dL 9.5 9.0 8.9  Total Protein 6.5 - 8.1 g/dL - - 6.7  Total Bilirubin 0.3 - 1.2 mg/dL - - 0.5  Alkaline Phos 38 - 126 U/L - - 41  AST 15 - 41 U/L - - 47(H)  ALT 14 - 54 U/L - - 49    Lab Results  Component Value Date   WBC 7.9 03/21/2017   HGB 13.2 03/21/2017   HCT 39.3 03/21/2017   MCV 94.9 03/21/2017   PLT 162 03/21/2017   NEUTROABS 3.8 09/07/2016    ASSESSMENT & PLAN:  Breast cancer of upper-inner quadrant of left female breast ([place]) Left breast invasive ductal carcinoma status post lumpectomy 08/05/2014 0.4 cm tumor, T1 aN0 M0 stage IA ER 90% PR 90% HER-2 negative ratio 1.34, Ki-67 10%, started tamoxifen 08/22/2014  Tamoxifen toxicities: Tolerating tamoxifen  extremely well without any major problems or concerns. Patient will complete tamoxifen but December 2020.  She may have another month or 2 of tamoxifen left and she will finish that and will discontinue it at that time.  Breast cancer surveillance: 1. Breast exam  9/22/2020benign 2. Mammograms  11/05/2018: Benign, breast density Cat C  Return to clinic in 1 year for follow-up with long-term survivorship    No orders of the defined types were placed in this encounter.  The patient has a good understanding of the overall plan. she agrees with it. she will call with any problems that may develop before the next visit here.  Nicholas Lose, MD 05/28/2019  Julious Oka Dorshimer am acting as scribe for Dr. Nicholas Lose.  I have reviewed the above documentation for accuracy and completeness, and I agree with the above.

## 2019-05-28 ENCOUNTER — Telehealth: Payer: Self-pay | Admitting: Adult Health

## 2019-05-28 ENCOUNTER — Other Ambulatory Visit: Payer: Self-pay

## 2019-05-28 ENCOUNTER — Inpatient Hospital Stay: Payer: Medicare Other | Attending: Hematology and Oncology | Admitting: Hematology and Oncology

## 2019-05-28 DIAGNOSIS — Z170 Estrogen receptor positive status [ER+]: Secondary | ICD-10-CM | POA: Insufficient documentation

## 2019-05-28 DIAGNOSIS — Z79810 Long term (current) use of selective estrogen receptor modulators (SERMs): Secondary | ICD-10-CM | POA: Diagnosis not present

## 2019-05-28 DIAGNOSIS — E785 Hyperlipidemia, unspecified: Secondary | ICD-10-CM | POA: Insufficient documentation

## 2019-05-28 DIAGNOSIS — Z7982 Long term (current) use of aspirin: Secondary | ICD-10-CM | POA: Diagnosis not present

## 2019-05-28 DIAGNOSIS — C50212 Malignant neoplasm of upper-inner quadrant of left female breast: Secondary | ICD-10-CM | POA: Insufficient documentation

## 2019-05-28 DIAGNOSIS — Z79899 Other long term (current) drug therapy: Secondary | ICD-10-CM | POA: Diagnosis not present

## 2019-05-28 NOTE — Telephone Encounter (Signed)
I talk with patient regarding schedule  

## 2019-05-28 NOTE — Assessment & Plan Note (Signed)
Left breast invasive ductal carcinoma status post lumpectomy 08/05/2014 0.4 cm tumor, T1 aN0 M0 stage IA ER 90% PR 90% HER-2 negative ratio 1.34, Ki-67 10%, started tamoxifen 08/22/2014  Tamoxifen toxicities: Tolerating tamoxifen extremely well without any major problems or concerns. Patient will complete tamoxifen but December 2020.  Breast cancer surveillance: 1. Breast exam  9/22/2020benign 2. Mammograms  11/05/2018: Benign, breast density Cat C  Return to clinic in 1 year for follow-up with long-term survivorship

## 2019-10-04 ENCOUNTER — Other Ambulatory Visit: Payer: Self-pay | Admitting: Endocrinology

## 2019-10-04 DIAGNOSIS — Z1231 Encounter for screening mammogram for malignant neoplasm of breast: Secondary | ICD-10-CM

## 2019-10-31 ENCOUNTER — Other Ambulatory Visit: Payer: Self-pay

## 2019-11-01 ENCOUNTER — Encounter: Payer: Self-pay | Admitting: Obstetrics & Gynecology

## 2019-11-01 ENCOUNTER — Other Ambulatory Visit: Payer: Self-pay

## 2019-11-01 ENCOUNTER — Ambulatory Visit (INDEPENDENT_AMBULATORY_CARE_PROVIDER_SITE_OTHER): Payer: Medicare Other | Admitting: Obstetrics & Gynecology

## 2019-11-01 VITALS — BP 118/70 | HR 68 | Temp 97.2°F | Resp 16 | Ht 62.75 in | Wt 135.0 lb

## 2019-11-01 DIAGNOSIS — Z01419 Encounter for gynecological examination (general) (routine) without abnormal findings: Secondary | ICD-10-CM | POA: Diagnosis not present

## 2019-11-01 MED ORDER — NYSTATIN 100000 UNIT/GM EX CREA: 1.0000 "application " | TOPICAL_CREAM | Freq: Two times a day (BID) | CUTANEOUS | 1 refills | Status: DC

## 2019-11-01 NOTE — Progress Notes (Signed)
86 y.o. CQ:[id number] Married White or Caucasian female here for annual exam.  Doing well.  Lives at [facility]  Has completed her Covid vaccination.    Denies vaginal bleeding.  Was started on raloxifene from Dr. Forde Dandy.  She wants my opinion.  We discussed the thromboembolic event and strong risk with it.  Has been at least a year or more since her prior BMD.  Last appt was with Dr. Lindi Adie in 07/2019.  She is now Tamoxifen 20mg  daily.  She finished this a few weeks ago.    Patient's last menstrual period was 09/05/1985 (approximate).          Sexually active: No.  The current method of family planning is post menopausal status.    Exercising: Yes.    exercise class Smoker:  no  Health Maintenance: Pap:  02-13-17 neg History of abnormal Pap:  no MMG:  11-05-2018 category c density birads 2:neg Colonoscopy:  2018 BMD:   2018 with pcp TDaP:  pcp Pneumonia vaccine(s):  Done with pcp Shingrix:   2019 Hep C testing: not done Screening Labs: done with Dr. Forde Dandy in    reports that she has never smoked. She has never used smokeless tobacco. She reports current alcohol use of about 2.0 standard drinks of alcohol per week. She reports that she does not use drugs.  Past Medical History:  Diagnosis Date  . Aortic sclerosis    03/29/13 echo  . Arthritis    HANDS,   . Breast cancer ([place])   . Bursitis    hip  . Cataracts, bilateral   . Coronary artery disease    DR. BERRY IS PT'S CARDIOLOGIST  . GERD (gastroesophageal reflux disease)   . Headache    history of migraines  . History of vertebral fracture   . Hyperlipidemia   . Hypertension   . Mitral regurgitation    mild 03/29/2013 echo  . PONV (postoperative nausea and vomiting)    PT HAD SEVERE NAUSEA WAKING UP IN RR AFTER BREAST LUMPECTOMY  . Rectal bleeding    not sure exactly what date    Past Surgical History:  Procedure Laterality Date  . APPENDECTOMY    . BREAST LUMPECTOMY Left 08/2014  . BREAST LUMPECTOMY WITH RADIOACTIVE  SEED LOCALIZATION Left 08/05/2014   Procedure: SEED LOCALIZED LEFT BREAST LUMPECTOMY;  Surgeon: Excell Seltzer, MD;  Location: [facility];  Service: General;  Laterality: Left;  . CATARACT EXTRACTION, BILATERAL    . COLONOSCOPY    . CORONARY ANGIOPLASTY WITH STENT PLACEMENT  11/16/1998   stent to the LAD and PTCA side branch diagonal  . DILATATION & CURETTAGE/HYSTEROSCOPY WITH MYOSURE N/A 03/29/2017   Procedure: DILATATION & CURETTAGE/HYSTEROSCOPY WITH MYOSURE, sampling of endometrium;  Surgeon: Megan Salon, MD;  Location: [facility];  Service: Gynecology;  Laterality: N/A;  . DILATION AND CURETTAGE OF UTERUS    . EYE SURGERY     both cataracts  . IR RADIOLOGIST EVAL & MGMT  05/18/2017  . NM MYOCAR PERF WALL MOTION  07/13/2009  . RE-EXCISION OF BREAST LUMPECTOMY Left 08/18/2014   Procedure: RE-EXCISION OF LEFT BREAST LUMPECTOMY;  Surgeon: Excell Seltzer, MD;  Location: [facility];  Service: General;  Laterality: Left;  . TONSILLECTOMY      Current Outpatient Medications  Medication Sig Dispense Refill  . aspirin EC 81 MG tablet Take 81 mg by mouth every evening.     . benazepril-hydrochlorthiazide (LOTENSIN HCT) 10-12.5 MG tablet TAKE 1 TABLET BY MOUTH  DAILY  90 tablet 0  . Biotin 5000 MCG CAPS Take 5,000 mcg by mouth daily.     . cholecalciferol (VITAMIN D) 1000 UNITS tablet Take 1,000 Units by mouth daily.    . Melatonin 3 MG CAPS Take 3 mg by mouth at bedtime as needed (Sleep).     . metoprolol tartrate (LOPRESSOR) 25 MG tablet TAKE 1 TABLET BY MOUTH 2  TIMES DAILY 180 tablet 0  . Misc Natural Products (OSTEO BI-FLEX JOINT SHIELD) TABS Take 2 tablets by mouth daily.    . Multiple Minerals-Vitamins (CALCIUM CITRATE PLUS PO) Take 1 tablet by mouth daily.     . Multiple Vitamin (MULTIVITAMIN WITH MINERALS) TABS tablet Take 1 tablet by mouth daily.    . Omega-3 Fatty Acids (FISH OIL) 1200 MG CPDR Take 1,200 mg by mouth daily.     . pantoprazole (PROTONIX) 40 MG tablet Take 1  tablet (40 mg total) by mouth daily. (Patient taking differently: Take 40 mg by mouth at bedtime. ) 30 tablet 1  . raloxifene (EVISTA) 60 MG tablet Take 60 mg by mouth daily.    . simvastatin (ZOCOR) 80 MG tablet Take 80 mg by mouth daily.    . traMADol (ULTRAM) 50 MG tablet Take 1 tablet (50 mg total) by mouth every 6 (six) hours as needed. 30 tablet   . vitamin C (ASCORBIC ACID) 500 MG tablet Take 500 mg by mouth daily.     No current facility-administered medications for this visit.    Family History  Problem Relation Age of Onset  . Cancer Mother   . Heart attack Father   . Colon cancer Neg Hx   . Breast cancer Neg Hx     Review of Systems  Constitutional: Negative.   HENT: Negative.   Eyes: Negative.   Respiratory: Negative.   Cardiovascular: Negative.   Gastrointestinal: Negative.   Genitourinary: Negative.   Musculoskeletal: Negative.   Skin: Negative.   Neurological: Negative.   Psychiatric/Behavioral: Negative.     Exam:   BP 118/70   Pulse 68   Temp (!) 97.2 F (36.2 C) (Skin)   Resp 16   Ht 5' 2.75" (1.594 m)   Wt 135 lb (61.2 kg)   LMP 09/05/1985 (Approximate)   BMI 24.11 kg/m   Height: 5' 2.75" (159.4 cm)  Ht Readings from Last 3 Encounters:  11/01/19 5' 2.75" (1.594 m)  05/28/19 5' 3.75" (1.619 m)  01/02/19 5' 3.75" (1.619 m)    General appearance: alert, cooperative and appears stated age Head: Normocephalic, without obvious abnormality, atraumatic Neck: no adenopathy, supple, symmetrical, trachea midline and thyroid non-palpable Lungs: clear to auscultation bilaterally Breasts: left breast with well healed incision, no masses or LAD in either breast/axilla, mild erythema under breast c/w yeast Heart: regular rate and rhythm Abdomen: soft, non-tender; bowel sounds normal; no masses,  no organomegaly Extremities: extremities normal, atraumatic, no cyanosis or edema Skin: Skin color, texture, turgor normal. No rashes or lesions Lymph nodes:  Cervical, supraclavicular, and axillary nodes normal. No abnormal inguinal nodes palpated Neurologic: Grossly normal   Pelvic: External genitalia:  no lesions              Urethra:  normal appearing urethra with no masses, tenderness or lesions              Bartholins and Skenes: normal                 Vagina: normal appearing vagina with normal color and discharge, no  lesions              Cervix: no lesions              Pap taken: No. Bimanual Exam:  Uterus:  normal size, contour, position, consistency, mobility, non-tender              Adnexa: normal adnexa and no mass, fullness, tenderness               Rectovaginal: Confirms               Anus:  normal sphincter tone, no lesions  Chaperone, Royal Hawthorn, CMA, was present for exam.  A:  Well Woman with normal exam PMP, no HRT H/o invasive ductal ca 07/2014, s/p lumpectomy, now off Tamoxifen Urinary incontinence, failed PT and medication use H/o vertebral fracture per pt's hx.  Was recently started on Raloxifene by Dr. Forde Dandy.  We did discussed thromboembolic event/stroke risk with this medication.  P:   Mammogram guidelines reviewed.  Doing yearly MMG. pap smear not indicated Colonoscopy no longer indicated Nystatin cream bid x 7 days.  #30/1RF return annually or prn

## 2019-11-12 ENCOUNTER — Ambulatory Visit
Admission: RE | Admit: 2019-11-12 | Discharge: 2019-11-12 | Disposition: A | Discharge status: Home / Self Care | Payer: Medicare Other | Source: Ambulatory Visit | Attending: Endocrinology | Admitting: Endocrinology

## 2019-11-12 ENCOUNTER — Other Ambulatory Visit: Payer: Self-pay

## 2019-11-12 DIAGNOSIS — Z1231 Encounter for screening mammogram for malignant neoplasm of breast: Secondary | ICD-10-CM

## 2020-03-25 ENCOUNTER — Telehealth: Payer: Self-pay | Admitting: Adult Health

## 2020-03-25 NOTE — Telephone Encounter (Signed)
Rescheduled appointment per 7/21 message. Patient is aware of updated appointment date and time.

## 2020-06-02 ENCOUNTER — Encounter: Payer: Medicare Other | Admitting: Adult Health

## 2020-06-03 ENCOUNTER — Encounter: Payer: Medicare Other | Admitting: Adult Health

## 2020-10-20 ENCOUNTER — Other Ambulatory Visit: Payer: Self-pay | Admitting: Endocrinology

## 2020-10-20 DIAGNOSIS — Z0000 Encounter for general adult medical examination without abnormal findings: Secondary | ICD-10-CM

## 2020-12-09 ENCOUNTER — Other Ambulatory Visit: Payer: Self-pay

## 2020-12-09 ENCOUNTER — Ambulatory Visit
Admission: RE | Admit: 2020-12-09 | Discharge: 2020-12-09 | Disposition: A | Discharge status: Home / Self Care | Payer: Medicare Other | Source: Ambulatory Visit | Attending: Endocrinology | Admitting: Endocrinology

## 2020-12-09 DIAGNOSIS — Z0000 Encounter for general adult medical examination without abnormal findings: Secondary | ICD-10-CM

## 2020-12-25 ENCOUNTER — Other Ambulatory Visit (HOSPITAL_BASED_OUTPATIENT_CLINIC_OR_DEPARTMENT_OTHER): Payer: Self-pay

## 2020-12-25 ENCOUNTER — Other Ambulatory Visit: Payer: Self-pay

## 2020-12-25 ENCOUNTER — Ambulatory Visit: Payer: Medicare Other | Attending: Internal Medicine

## 2020-12-25 DIAGNOSIS — Z23 Encounter for immunization: Secondary | ICD-10-CM

## 2020-12-25 MED ORDER — COVID-19 MRNA VACC (MODERNA) 100 MCG/0.5ML IM SUSP
INTRAMUSCULAR | 0 refills | Status: DC
  Filled 2020-12-25: qty 0.25, 1d supply, fill #0

## 2020-12-25 NOTE — Progress Notes (Signed)
   Covid-19 Vaccination Clinic  Name:  Dazha A Kisling    MRN: [id number] DOB: 06/21/1934  12/25/2020  Ms. Speagle was observed post Covid-19 immunization for 15 minutes without incident. She was provided with Vaccine Information Sheet and instruction to access the V-Safe system.   Ms. Willcutt was instructed to call 911 with any severe reactions post vaccine: . Difficulty breathing  . Swelling of face and throat  . A fast heartbeat  . A bad rash all over body  . Dizziness and weakness   Immunizations Administered    Name Date Dose VIS Date Route   Moderna Covid-19 Booster Vaccine 12/25/2020  3:34 PM 0.25 mL 06/24/2020 Intramuscular   Manufacturer: Moderna   Lot: [id number]   [facility]: 80777-273-10     

## 2020-12-25 NOTE — Progress Notes (Signed)
   Covid-19 Vaccination Clinic  Name:  Jasmin Holloway    MRN: [id number] DOB: Jan 26, 1934  12/25/2020  [name] was observed post Covid-19 immunization for 15 minutes without incident. She was provided with Vaccine Information Sheet and instruction to access the V-Safe system.   [name] was instructed to call 911 with any severe reactions post vaccine: Marland Kitchen Difficulty breathing  . Swelling of face and throat  . A fast heartbeat  . A bad rash all over body  . Dizziness and weakness   Immunizations Administered    Name Date Dose VIS Date Route   Moderna Covid-19 Booster Vaccine 12/25/2020  3:34 PM 0.25 mL 06/24/2020 Intramuscular   Manufacturer: Moderna   Lot: [id number]   [place]: 49969-249-32

## 2020-12-29 ENCOUNTER — Other Ambulatory Visit (HOSPITAL_BASED_OUTPATIENT_CLINIC_OR_DEPARTMENT_OTHER): Payer: Self-pay

## 2021-11-04 ENCOUNTER — Other Ambulatory Visit: Payer: Self-pay | Admitting: Endocrinology

## 2021-11-04 DIAGNOSIS — Z1231 Encounter for screening mammogram for malignant neoplasm of breast: Secondary | ICD-10-CM

## 2021-11-11 ENCOUNTER — Ambulatory Visit: Payer: Medicare Other

## 2021-12-10 ENCOUNTER — Ambulatory Visit: Payer: Medicare Other

## 2021-12-28 ENCOUNTER — Ambulatory Visit
Admission: RE | Admit: 2021-12-28 | Discharge: 2021-12-28 | Disposition: A | Discharge status: Home / Self Care | Payer: Medicare Other | Source: Ambulatory Visit | Attending: Endocrinology | Admitting: Endocrinology

## 2021-12-28 DIAGNOSIS — Z1231 Encounter for screening mammogram for malignant neoplasm of breast: Secondary | ICD-10-CM

## 2022-06-07 ENCOUNTER — Other Ambulatory Visit (HOSPITAL_BASED_OUTPATIENT_CLINIC_OR_DEPARTMENT_OTHER): Payer: Self-pay

## 2022-06-07 MED ORDER — FLUAD QUADRIVALENT 0.5 ML IM PRSY
PREFILLED_SYRINGE | INTRAMUSCULAR | 0 refills | Status: DC
  Filled 2022-06-07: qty 0.5, 1d supply, fill #0

## 2022-09-01 ENCOUNTER — Other Ambulatory Visit (HOSPITAL_BASED_OUTPATIENT_CLINIC_OR_DEPARTMENT_OTHER): Payer: Self-pay

## 2022-09-01 MED ORDER — AREXVY 120 MCG/0.5ML IM SUSR
INTRAMUSCULAR | 0 refills | Status: DC
  Filled 2022-09-01: qty 0.5, 1d supply, fill #0

## 2022-09-02 ENCOUNTER — Other Ambulatory Visit (HOSPITAL_BASED_OUTPATIENT_CLINIC_OR_DEPARTMENT_OTHER): Payer: Self-pay

## 2022-09-12 ENCOUNTER — Other Ambulatory Visit (HOSPITAL_BASED_OUTPATIENT_CLINIC_OR_DEPARTMENT_OTHER): Payer: Self-pay

## 2022-09-12 MED ORDER — SHINGRIX 50 MCG/0.5ML IM SUSR
INTRAMUSCULAR | 1 refills | Status: DC
  Filled 2022-09-12: qty 0.5, 1d supply, fill #0
  Filled 2022-11-11: qty 0.5, 1d supply, fill #1

## 2022-11-11 ENCOUNTER — Other Ambulatory Visit (HOSPITAL_BASED_OUTPATIENT_CLINIC_OR_DEPARTMENT_OTHER): Payer: Self-pay

## 2022-11-14 ENCOUNTER — Other Ambulatory Visit (HOSPITAL_BASED_OUTPATIENT_CLINIC_OR_DEPARTMENT_OTHER): Payer: Self-pay

## 2022-11-30 ENCOUNTER — Other Ambulatory Visit: Payer: Self-pay | Admitting: Endocrinology

## 2022-11-30 DIAGNOSIS — Z1231 Encounter for screening mammogram for malignant neoplasm of breast: Secondary | ICD-10-CM

## 2023-01-17 ENCOUNTER — Ambulatory Visit
Admission: RE | Admit: 2023-01-17 | Discharge: 2023-01-17 | Disposition: A | Discharge status: Home / Self Care | Payer: Medicare Other | Source: Ambulatory Visit | Attending: Endocrinology | Admitting: Endocrinology

## 2023-01-17 DIAGNOSIS — Z1231 Encounter for screening mammogram for malignant neoplasm of breast: Secondary | ICD-10-CM

## 2023-02-15 ENCOUNTER — Other Ambulatory Visit (HOSPITAL_BASED_OUTPATIENT_CLINIC_OR_DEPARTMENT_OTHER): Payer: Self-pay

## 2023-02-15 MED ORDER — COMIRNATY 30 MCG/0.3ML IM SUSY
0.3000 mL | PREFILLED_SYRINGE | INTRAMUSCULAR | 0 refills | Status: DC
  Filled 2023-02-15: qty 0.3, 1d supply, fill #0

## 2023-02-21 ENCOUNTER — Ambulatory Visit: Payer: Medicare Other

## 2023-06-02 ENCOUNTER — Encounter: Payer: Self-pay | Admitting: Podiatry

## 2023-06-02 ENCOUNTER — Ambulatory Visit (INDEPENDENT_AMBULATORY_CARE_PROVIDER_SITE_OTHER): Payer: Medicare Other | Admitting: Podiatry

## 2023-06-02 ENCOUNTER — Ambulatory Visit (INDEPENDENT_AMBULATORY_CARE_PROVIDER_SITE_OTHER): Payer: Medicare Other

## 2023-06-02 VITALS — BP 126/59 | HR 56

## 2023-06-02 DIAGNOSIS — M722 Plantar fascial fibromatosis: Secondary | ICD-10-CM

## 2023-06-02 MED ORDER — TRIAMCINOLONE ACETONIDE 10 MG/ML IJ SUSP
10.0000 mg | Freq: Once | INTRAMUSCULAR | Status: AC
  Administered 2023-06-02: 10 mg via INTRA_ARTICULAR

## 2023-06-04 NOTE — Progress Notes (Signed)
Subjective:   Patient ID: Jasmin Holloway, female   DOB: 89 y.o.   MRN: [id number]   HPI Patient states she has developed a lot of pain in her right heel for the last 6 weeks does not remember specific injury and states it suddenly gotten worse and hard to walk on.  Patient does not smoke likes to be active   Review of Systems  All other systems reviewed and are negative.       Objective:  Physical Exam Vitals and nursing note reviewed.  Constitutional:      Appearance: She is well-developed.  Pulmonary:     Effort: Pulmonary effort is normal.  Musculoskeletal:        General: Normal range of motion.  Skin:    General: Skin is warm.  Neurological:     Mental Status: She is alert.     Neurovascular status intact muscle strength adequate range of motion adequate with exquisite discomfort medial fascial band right at the insertional point tendon into the calcaneus     Assessment:  Acute plantar fasciitis right inflammation fluid medial band     Plan:  H&P reviewed recommended conservative treatment after reviewing x-rays sterile prep injected the fascia 3 mg Kenalog 5 mg Xylocaine advised on support and patient to be seen back to recheck  X-rays indicate small spur no indication stress fracture arthritis

## 2023-06-30 ENCOUNTER — Encounter (HOSPITAL_BASED_OUTPATIENT_CLINIC_OR_DEPARTMENT_OTHER): Payer: Self-pay | Admitting: Obstetrics & Gynecology

## 2023-06-30 ENCOUNTER — Ambulatory Visit (HOSPITAL_BASED_OUTPATIENT_CLINIC_OR_DEPARTMENT_OTHER): Payer: Medicare Other | Admitting: Obstetrics & Gynecology

## 2023-06-30 VITALS — BP 100/59 | HR 67 | Ht 62.75 in | Wt 118.8 lb

## 2023-06-30 DIAGNOSIS — Z9189 Other specified personal risk factors, not elsewhere classified: Secondary | ICD-10-CM

## 2023-06-30 DIAGNOSIS — Z170 Estrogen receptor positive status [ER+]: Secondary | ICD-10-CM | POA: Diagnosis not present

## 2023-06-30 DIAGNOSIS — M8580 Other specified disorders of bone density and structure, unspecified site: Secondary | ICD-10-CM | POA: Diagnosis not present

## 2023-06-30 DIAGNOSIS — C50212 Malignant neoplasm of upper-inner quadrant of left female breast: Secondary | ICD-10-CM | POA: Diagnosis not present

## 2023-06-30 NOTE — Progress Notes (Signed)
89 y.o. [id number] Married White or Caucasian female here for breast and pelvic exam.  I am also following her for PMP status.  Still living at [facility] in a one bedroom.  Denies vaginal bleeding.  Has questions about continuing mammograms.  Last one she had was a miserable experience.  Had bleeding after last mammogram.  Took a while for this to heal.  Felt like the tech was not very sympathetic.  She is is considering stopping these.  She has hx of breast cancer.  On Raloxifene.  We discussed pros/cons of stopping mammograms.  Stopped seeing oncology after 5 years.    H/o osteopenia.  Also on Raloxifene for this.    Patient's last menstrual period was 09/05/1985 (approximate).          Sexually active: No.  H/O STD:  no  Health Maintenance: PCP:  Dr. Evlyn Kanner.  Seeing him twice yearly.  Does blood work yearly.     Vaccines are up to date:  pt feels she is up to date Colonoscopy:  10/2016 MMG:  12/2021. BMD:  followed by Dr. Evlyn Kanner H/o abnormal pap smear:  no   reports that she has never smoked. She has never used smokeless tobacco. She reports that she does not currently use alcohol after a past usage of about 2.0 standard drinks of alcohol per week. She reports that she does not use drugs.  Past Medical History:  Diagnosis Date   Aortic sclerosis    03/29/13 echo   Arthritis    HANDS,    Breast cancer (HCC) left   2015   Bursitis    hip   Cataracts, bilateral    Coronary artery disease    DR. BERRY IS PT'S CARDIOLOGIST   GERD (gastroesophageal reflux disease)    Headache    history of migraines   History of vertebral fracture    Hyperlipidemia    Hypertension    Mitral regurgitation    mild 03/29/2013 echo   PONV (postoperative nausea and vomiting)    PT HAD SEVERE NAUSEA WAKING UP IN RR AFTER BREAST LUMPECTOMY   Rectal bleeding    not sure exactly what date    Past Surgical History:  Procedure Laterality Date   APPENDECTOMY     BREAST LUMPECTOMY Left 08/2014   BREAST  LUMPECTOMY WITH RADIOACTIVE SEED LOCALIZATION Left 08/05/2014   Procedure: SEED LOCALIZED LEFT BREAST LUMPECTOMY;  Surgeon: Glenna Fellows, MD;  Location: [facility];  Service: General;  Laterality: Left;   CATARACT EXTRACTION, BILATERAL     COLONOSCOPY     CORONARY ANGIOPLASTY WITH STENT PLACEMENT  11/16/1998   stent to the LAD and PTCA side branch diagonal   DILATATION & CURETTAGE/HYSTEROSCOPY WITH MYOSURE N/A 03/29/2017   Procedure: DILATATION & CURETTAGE/HYSTEROSCOPY WITH MYOSURE, sampling of endometrium;  Surgeon: Jerene Bears, MD;  Location: [facility];  Service: Gynecology;  Laterality: N/A;   DILATION AND CURETTAGE OF UTERUS     EYE SURGERY     both cataracts   IR RADIOLOGIST EVAL & MGMT  05/18/2017   NM MYOCAR PERF WALL MOTION  07/13/2009   RE-EXCISION OF BREAST LUMPECTOMY Left 08/18/2014   Procedure: RE-EXCISION OF LEFT BREAST LUMPECTOMY;  Surgeon: Glenna Fellows, MD;  Location: [facility];  Service: General;  Laterality: Left;   TONSILLECTOMY      Current Outpatient Medications  Medication Sig Dispense Refill   aspirin EC 81 MG tablet Take 81 mg by mouth every evening.      benazepril-hydrochlorthiazide (  LOTENSIN HCT) 10-12.5 MG tablet TAKE 1 TABLET BY MOUTH  DAILY 90 tablet 0   Biotin 5000 MCG CAPS Take 5,000 mcg by mouth daily.      cholecalciferol (VITAMIN D) 1000 UNITS tablet Take 1,000 Units by mouth daily.     COVID-19 mRNA vaccine 2023-2024 (COMIRNATY) syringe Inject 0.3 mLs into the muscle. 0.3 mL 0   COVID-19 mRNA vaccine, Moderna, 100 MCG/0.5ML injection Inject into the muscle. 0.25 mL 0   influenza vaccine adjuvanted (FLUAD QUADRIVALENT) 0.5 ML injection Inject into the muscle. 0.5 mL 0   Melatonin 3 MG CAPS Take 3 mg by mouth at bedtime as needed (Sleep).      metoprolol tartrate (LOPRESSOR) 25 MG tablet TAKE 1 TABLET BY MOUTH 2  TIMES DAILY 180 tablet 0   Misc Natural Products (OSTEO BI-FLEX JOINT SHIELD) TABS Take 2 tablets by mouth daily.     Multiple  Minerals-Vitamins (CALCIUM CITRATE PLUS PO) Take 1 tablet by mouth daily.      Multiple Vitamin (MULTIVITAMIN WITH MINERALS) TABS tablet Take 1 tablet by mouth daily.     nystatin cream (MYCOSTATIN) Apply 1 application topically 2 (two) times daily. Apply to affected area BID for up to 7 days. 30 g 1   Omega-3 Fatty Acids (FISH OIL) 1200 MG CPDR Take 1,200 mg by mouth daily.      pantoprazole (PROTONIX) 40 MG tablet Take 1 tablet (40 mg total) by mouth daily. (Patient taking differently: Take 40 mg by mouth at bedtime.) 30 tablet 1   raloxifene (EVISTA) 60 MG tablet Take 60 mg by mouth daily.     RSV vaccine recomb adjuvanted (AREXVY) 120 MCG/0.5ML injection Inject into the muscle. 0.5 mL 0   simvastatin (ZOCOR) 80 MG tablet Take 80 mg by mouth daily.     traMADol (ULTRAM) 50 MG tablet Take 1 tablet (50 mg total) by mouth every 6 (six) hours as needed. 30 tablet    vitamin C (ASCORBIC ACID) 500 MG tablet Take 500 mg by mouth daily.     Zoster Vaccine Adjuvanted [facility]) injection Inject into the muscle. 0.5 mL 1   No current facility-administered medications for this visit.    Family History  Problem Relation Age of Onset   Cancer Mother    Heart attack Father    Colon cancer Neg Hx    Breast cancer Neg Hx     Review of Systems  Constitutional: Negative.   Genitourinary: Negative.     Exam:   BP (!) 100/59 (BP Location: Left Arm, Patient Position: Sitting, Cuff Size: Normal)   Pulse 67   Ht 5' 2.75" (1.594 m)   Wt 118 lb 12.8 oz (53.9 kg)   LMP 09/05/1985 (Approximate)   BMI 21.21 kg/m   Height: 5' 2.75" (159.4 cm)  General appearance: alert, cooperative and appears stated age Breasts: normal appearance, no masses or tenderness Abdomen: soft, non-tender; bowel sounds normal; no masses,  no organomegaly Lymph nodes: Cervical, supraclavicular, and axillary nodes normal.  No abnormal inguinal nodes palpated Neurologic: Grossly normal  Pelvic: External genitalia:  no  lesions              Urethra:  normal appearing urethra with no masses, tenderness or lesions              Bartholins and Skenes: normal                 Vagina: normal appearing vagina with atrophic changes and no discharge, no lesions  Cervix: no lesions              Pap taken: No. Bimanual Exam:  Uterus:  normal size, contour, position, consistency, mobility, non-tender              Adnexa: normal adnexa and no mass, fullness, tenderness               Rectovaginal:  deferred               Anus:  no lesions  Chaperone, Hendricks Milo, CMA, was present for exam.  Assessment/Plan: 1. GYN exam for high-risk Medicare patient - Pap smear not indicated - Mammogram guidelines discussed.  She has decided she does not want to continue.  Will plan to have yearly breast exams here. - Colonoscopy no longer indicated - Bone mineral density done with DR. Saint Martin - lab work done with PCP, Dr. Evlyn Kanner - vaccines reviewed/updated  2. Malignant neoplasm of upper-inner quadrant of left breast in female, estrogen receptor positive (HCC) - on raloxifene  3. Osteopenia, unspecified location  Total time with pt:  32 minutes

## 2024-01-20 ENCOUNTER — Emergency Department (HOSPITAL_BASED_OUTPATIENT_CLINIC_OR_DEPARTMENT_OTHER): Admitting: Radiology

## 2024-01-20 ENCOUNTER — Emergency Department (HOSPITAL_BASED_OUTPATIENT_CLINIC_OR_DEPARTMENT_OTHER)

## 2024-01-20 ENCOUNTER — Emergency Department (HOSPITAL_BASED_OUTPATIENT_CLINIC_OR_DEPARTMENT_OTHER)
Admission: EM | Admit: 2024-01-20 | Discharge: 2024-01-20 | Disposition: A | Discharge status: Home / Self Care | Attending: Emergency Medicine | Admitting: Emergency Medicine

## 2024-01-20 ENCOUNTER — Other Ambulatory Visit: Payer: Self-pay

## 2024-01-20 ENCOUNTER — Encounter (HOSPITAL_BASED_OUTPATIENT_CLINIC_OR_DEPARTMENT_OTHER): Payer: Self-pay | Admitting: Emergency Medicine

## 2024-01-20 ENCOUNTER — Other Ambulatory Visit (HOSPITAL_BASED_OUTPATIENT_CLINIC_OR_DEPARTMENT_OTHER): Payer: Self-pay

## 2024-01-20 DIAGNOSIS — Y92481 Parking lot as the place of occurrence of the external cause: Secondary | ICD-10-CM | POA: Insufficient documentation

## 2024-01-20 DIAGNOSIS — I10 Essential (primary) hypertension: Secondary | ICD-10-CM | POA: Insufficient documentation

## 2024-01-20 DIAGNOSIS — J449 Chronic obstructive pulmonary disease, unspecified: Secondary | ICD-10-CM | POA: Insufficient documentation

## 2024-01-20 DIAGNOSIS — Z79899 Other long term (current) drug therapy: Secondary | ICD-10-CM | POA: Insufficient documentation

## 2024-01-20 DIAGNOSIS — S80211A Abrasion, right knee, initial encounter: Secondary | ICD-10-CM | POA: Diagnosis not present

## 2024-01-20 DIAGNOSIS — Z853 Personal history of malignant neoplasm of breast: Secondary | ICD-10-CM | POA: Insufficient documentation

## 2024-01-20 DIAGNOSIS — W19XXXA Unspecified fall, initial encounter: Secondary | ICD-10-CM

## 2024-01-20 DIAGNOSIS — S0081XA Abrasion of other part of head, initial encounter: Secondary | ICD-10-CM | POA: Diagnosis not present

## 2024-01-20 DIAGNOSIS — W010XXA Fall on same level from slipping, tripping and stumbling without subsequent striking against object, initial encounter: Secondary | ICD-10-CM | POA: Diagnosis not present

## 2024-01-20 DIAGNOSIS — S0990XA Unspecified injury of head, initial encounter: Secondary | ICD-10-CM | POA: Diagnosis present

## 2024-01-20 DIAGNOSIS — Z7982 Long term (current) use of aspirin: Secondary | ICD-10-CM | POA: Diagnosis not present

## 2024-01-20 DIAGNOSIS — S60511A Abrasion of right hand, initial encounter: Secondary | ICD-10-CM | POA: Diagnosis not present

## 2024-01-20 MED ORDER — BACITRACIN 500 UNIT/GM EX OINT
1.0000 | TOPICAL_OINTMENT | Freq: Two times a day (BID) | CUTANEOUS | 0 refills | Status: DC
  Filled 2024-01-20: qty 112, 30d supply, fill #0

## 2024-01-20 NOTE — ED Notes (Signed)
Patient returned from radiology via w/c

## 2024-01-20 NOTE — ED Provider Notes (Signed)
[place] EMERGENCY DEPARTMENT AT [facility] Provider Note   CSN: [id number] Arrival date & time: 01/20/24  1336     History  No chief complaint on file.   Jasmin Holloway is a 89 y.o. female.  HPI   89 year old female presents emergency department with complaints of fall.  States that she had just gotten out of her vehicle when she tripped over one of the curbs getting out of the parking lot.  States that she fell on her right side.  Did hit the right side of her forehead on the ground.  Denies LOC, blood thinner use but does take a daily aspirin.  Denies any visual symptoms, gait abnormality from baseline, weakness/sensory deficits in the lower extremities, slurred speech, facial droop.  Currently complaining of right wrist pain, right shoulder pain, right knee pain.  Incident occurred yesterday afternoon.  States that she lives at [facility].  Past medical history significant for breast cancer, COPD, GERD, hyperlipidemia, hypertension, mitral regurgitation, GI bleed  Home Medications Prior to Admission medications   Medication Sig Start Date End Date Taking? Authorizing Provider  aspirin EC 81 MG tablet Take 81 mg by mouth every evening.     [provider]  benazepril -hydrochlorthiazide (LOTENSIN  HCT) 10-12.5 MG tablet TAKE 1 TABLET BY MOUTH  DAILY 09/12/16   Avanell Leigh, MD  Biotin  5000 MCG CAPS Take 5,000 mcg by mouth daily.     [provider]  cholecalciferol  (VITAMIN D) 1000 UNITS tablet Take 1,000 Units by mouth daily.    [provider]  COVID-19 mRNA vaccine [phone] (COMIRNATY ) syringe Inject 0.3 mLs into the muscle. 02/15/23   Liane Redman, MD  COVID-19 mRNA vaccine, Moderna, 100 MCG/0.5ML injection Inject into the muscle. 12/25/20   Liane Redman, MD  influenza vaccine adjuvanted (FLUAD QUADRIVALENT ) 0.5 ML injection Inject into the muscle. 06/06/22   Liane Redman, MD  Melatonin 3 MG CAPS Take 3  mg by mouth at bedtime as needed (Sleep).     [provider]  metoprolol  tartrate (LOPRESSOR ) 25 MG tablet TAKE 1 TABLET BY MOUTH 2  TIMES DAILY 09/12/16   Avanell Leigh, MD  Misc Natural Products (OSTEO BI-FLEX JOINT SHIELD) TABS Take 2 tablets by mouth daily.    [provider]  Multiple Minerals-Vitamins (CALCIUM  CITRATE PLUS PO) Take 1 tablet by mouth daily.     [provider]  Multiple Vitamin (MULTIVITAMIN WITH MINERALS) TABS tablet Take 1 tablet by mouth daily.    [provider]  nystatin  cream (MYCOSTATIN ) Apply 1 application topically 2 (two) times daily. Apply to affected area BID for up to 7 days. 11/01/19   Lillian Rein, MD  Omega-3 Fatty Acids (FISH OIL) 1200 MG CPDR Take 1,200 mg by mouth daily.     [provider]  pantoprazole  (PROTONIX ) 40 MG tablet Take 1 tablet (40 mg total) by mouth daily. Patient taking differently: Take 40 mg by mouth at bedtime. 01/24/13   Beatrice Lin, MD  raloxifene (EVISTA) 60 MG tablet Take 60 mg by mouth daily. 09/20/19   [provider]  RSV vaccine recomb adjuvanted (AREXVY ) 120 MCG/0.5ML injection Inject into the muscle. 09/01/22   Liane Redman, MD  simvastatin (ZOCOR) 80 MG tablet Take 80 mg by mouth daily.    [provider]  traMADol  (ULTRAM ) 50 MG tablet Take 1 tablet (50 mg total) by mouth every 6 (six) hours as needed. 05/25/18   Gudena, Vinay, MD  vitamin C  (  ASCORBIC ACID ) 500 MG tablet Take 500 mg by mouth daily.    [provider]  Zoster Vaccine Adjuvanted (SHINGRIX ) injection Inject into the muscle. 09/12/22   Liane Redman, MD      Allergies    Codeine    Review of Systems   Review of Systems  All other systems reviewed and are negative.   Physical Exam Updated Vital Signs BP (!) 117/59   Pulse 78   Temp 97.8 F (36.6 C) (Oral)   Resp 16   LMP 09/05/1985 (Approximate)   SpO2 96%  Physical Exam Vitals and nursing note reviewed.   Constitutional:      General: She is not in acute distress.    Appearance: She is well-developed.  HENT:     Head: Normocephalic.     Comments: Superficial abrasion appreciated right temporal region measuring 2.3 cm in the length. Eyes:     Conjunctiva/sclera: Conjunctivae normal.  Cardiovascular:     Rate and Rhythm: Normal rate and regular rhythm.  Pulmonary:     Effort: Pulmonary effort is normal. No respiratory distress.     Breath sounds: Normal breath sounds.  Abdominal:     Palpations: Abdomen is soft.     Tenderness: There is no abdominal tenderness.  Musculoskeletal:        General: No swelling.     Cervical back: Neck supple.     Comments: No midline tenderness cervical, thoracic and lumbar spine without step-off or deformity.  No chest wall tenderness.  Mild tender palpation of right proximal humerus.  Tender to palpation right distal radius/ulna.  Swelling appreciated to dorsal aspect of patient's right hand.  Superficial abrasion appreciated on the dorsal aspect of patient's right hand.  Additionally, abrasion appreciated right anterior knee with overlying patellar tenderness.  Able to range right knee without difficulty.  Otherwise, no tenderness bilateral upper or lower extremities.  Skin:    General: Skin is warm and dry.     Capillary Refill: Capillary refill takes less than 2 seconds.  Neurological:     Mental Status: She is alert.     Comments: Alert and oriented to self, place, time and event.   Speech is fluent, clear without dysarthria or dysphasia.   Strength symmetric in upper/lower extremities   Sensation intact in upper/lower extremities   Able to ambulate independently without obvious gait deviation. CN I not tested  CN II not tested CN III, IV, VI PERRLA and EOMs intact bilaterally  CN V Intact sensation to sharp and light touch to the face  CN VII facial movements symmetric  CN VIII not tested  CN IX, X no uvula deviation, symmetric rise of soft  palate  CN XI symmetric SCM and trapezius strength bilaterally  CN XII Midline tongue protrusion, symmetric L/R movements     Psychiatric:        Mood and Affect: Mood normal.    ED Results / Procedures / Treatments   Labs (all labs ordered are listed, but only abnormal results are displayed) Labs Reviewed - No data to display  EKG None  Radiology No results found.  Procedures Procedures    Medications Ordered in ED Medications - No data to display  ED Course/ Medical Decision Making/ A&P                                 Medical Decision Making Amount and/or Complexity of Data Reviewed Radiology:  ordered.  Risk OTC drugs.   This patient presents to the ED for concern of fall, this involves an extensive number of treatment options, and is a complaint that carries with it a high risk of complications and morbidity.  The differential diagnosis includes CVA, fracture, strain/pain, dislocation, ligamentous/tendon injury, neurovascular mice, pneumothorax, solid organ damage, other   Co morbidities that complicate the patient evaluation  See HPI   Additional history obtained:  Additional history obtained from EMR External records from outside source obtained and reviewed including hospital records   Lab Tests:  N/a   Imaging Studies ordered:  I ordered imaging studies including CT head/cervical spine, right shoulder x-ray, right wrist/hand x-ray, right knee x-ray I independently visualized and interpreted imaging which showed CT head/cervical spine: No acute intracranial malady.  No acute fracture or traumatic subluxation of cervical spine. Right shoulder x-ray: No fracture location. Right wrist/hand x-ray: No acute abnormality.  Improvements with ibuprofen presents with swelling: gull wing deformity IP joints. Right knee x-ray: No fracture or dislocation.  Mild degenerative spurring.  I agree with the radiologist interpretation   Cardiac Monitoring: /  EKG:  The patient was maintained on a cardiac monitor.  I personally viewed and interpreted the cardiac monitored which showed an underlying rhythm of: sinus rhythm   Consultations Obtained:  N/a   Problem List / ED Course / Critical interventions / Medication management  Fall Reevaluation of the patient showed that the patient stayed the same I have reviewed the patients home medicines and have made adjustments as needed   Social Determinants of Health:  Denies tobacco, licit drug use.   Test / Admission - Considered:  Fall Vitals signs within normal range and stable throughout visit. Imaging studies significant for: See above 89 year old female presents emergency department with complaints of fall.  States that she had just gotten out of her vehicle when she tripped over one of the curbs getting out of the parking lot.  States that she fell on her right side.  Did hit the right side of her forehead on the ground.  Denies LOC, blood thinner use but does take a daily aspirin.  Denies any visual symptoms, gait abnormality from baseline, weakness/sensory deficits in the lower extremities, slurred speech, facial droop.  Currently complaining of right wrist pain, right shoulder pain, right knee pain.  Incident occurred yesterday afternoon.  States that she lives at [facility]. On exam, abrasion appreciated dorsal aspect of right hand, anterior right knee, right side of head.  Nonfocal neurologic exam.  Reproducible tenderness right shoulder, right wrist/hand, right knee as above.  Imaging studies of head, cervical spine, other reproducible areas of tenderness or appreciable traumatic injury negative for any acute abnormality from fall yesterday.  Patient reassured by findings.  Placed in right wrist splint and recommend follow-up with PCP/Ortho in the outpatient setting for reassessment.  Treatment plan discussed with patient and she acknowledged understanding was  agreeable to said plan.  Patient overall well-appearing, afebrile in no acute distress. Worrisome signs and symptoms were discussed with the patient, and the patient acknowledged understanding to return to the ED if noticed. Patient was stable upon discharge.          Final Clinical Impression(s) / ED Diagnoses Final diagnoses:  None    Rx / DC Orders ED Discharge Orders     None         Marietta Butter, [place] 01/20/24 1602    Quinn Bucco, DO 01/22/24  0734  

## 2024-01-20 NOTE — ED Triage Notes (Signed)
 Pt presents after mechanical fall . Pt fell on her right side onto pavement, right wrist is stiff and swollen. She has abrasion to right shoulder. This happened yesterday.  Wants xray right wrist. Can't pick up anything with her right hand

## 2024-01-20 NOTE — Discharge Instructions (Signed)
 As discussed, your workup today was overall reassuring.  CT scan and x-rays were negative for any brain bleed, fracture, dislocation.  Will place you in a right wrist brace to aid in protecting your right wrist.  Attaches number for hand specialist to follow-up with if you continue to have symptoms.  Recommend icing the wrist as well as elevating to help with swelling/inflammation.  Will also send an antibiotic ointment to place over the abrasions on your right hand, right face as well as right knee.  Recommend follow-up with primary care for reassessment of your symptoms.  Please do not hesitate to return if the worrisome signs and symptoms we discussed become apparent.

## 2024-01-31 ENCOUNTER — Other Ambulatory Visit (HOSPITAL_BASED_OUTPATIENT_CLINIC_OR_DEPARTMENT_OTHER): Payer: Self-pay

## 2024-07-11 ENCOUNTER — Encounter (HOSPITAL_BASED_OUTPATIENT_CLINIC_OR_DEPARTMENT_OTHER): Payer: Self-pay | Admitting: Obstetrics & Gynecology

## 2024-07-11 ENCOUNTER — Ambulatory Visit (HOSPITAL_BASED_OUTPATIENT_CLINIC_OR_DEPARTMENT_OTHER): Payer: Medicare Other | Admitting: Obstetrics & Gynecology

## 2024-07-11 VITALS — BP 116/62 | HR 65 | Wt 114.0 lb

## 2024-07-11 DIAGNOSIS — Z780 Asymptomatic menopausal state: Secondary | ICD-10-CM | POA: Diagnosis not present

## 2024-07-11 DIAGNOSIS — Z853 Personal history of malignant neoplasm of breast: Secondary | ICD-10-CM | POA: Diagnosis not present

## 2024-07-11 DIAGNOSIS — Z1331 Encounter for screening for depression: Secondary | ICD-10-CM

## 2024-07-11 DIAGNOSIS — Z01419 Encounter for gynecological examination (general) (routine) without abnormal findings: Secondary | ICD-10-CM | POA: Diagnosis not present

## 2024-07-11 DIAGNOSIS — Z9189 Other specified personal risk factors, not elsewhere classified: Secondary | ICD-10-CM

## 2024-07-11 DIAGNOSIS — N952 Postmenopausal atrophic vaginitis: Secondary | ICD-10-CM

## 2024-07-11 DIAGNOSIS — Z170 Estrogen receptor positive status [ER+]: Secondary | ICD-10-CM | POA: Diagnosis not present

## 2024-07-11 NOTE — Progress Notes (Signed)
 ANNUAL EXAM Patient name: Jasmin Holloway MRN [id number]  Date of birth: 08-03-1934 Chief Complaint:   Gynecologic Exam  History of Present Illness:   Jasmin Holloway is a 89 y.o. [id number] Caucasian female being seen today for breast and pelvic exam.  Denies vaginal bleeding.  H/o breast cancer treated with lumpectomy, re excision, and Tamoxifen  x 5 years.  Now on Raloxifene which is ordered by Dr. Nichole.  She had a really negative experience with her last mammogram where she basically had tears in her skin from the test that caused pain and bleeding.   Did have a fall in May.  She did hit her head and went to the ER.  CT was normal.  X-rays were normal.  She has a little incontinence from time to time.  Last mammogram was 54/26/2023.  She has decided she is not going to have another one.    Patient's last menstrual period was 09/05/1985 (approximate).   Last pap 02/13/2017. Results were: NILM w/ HRHPV not done. H/O abnormal pap: no Last mammogram: 12/28/2021. Results were: normal. Family h/o breast cancer: no Last colonoscopy: 10/07/2016. Results were: normal. Family h/o colorectal cancer: no     07/11/2024    2:20 PM 06/30/2023    9:34 AM  Depression screen PHQ 2/9  Decreased Interest 0 0  Down, Depressed, Hopeless 0 0  PHQ - 2 Score 0 0     Review of Systems:   Pertinent items are noted in HPI Denies any urinary or bowel changes.  Denies pelvic pain. Pertinent History Reviewed:  Reviewed past medical,surgical, social and family history.  Reviewed problem list, medications and allergies. Physical Assessment:   Vitals:   07/11/24 1418  BP: 116/62  Pulse: 65  SpO2: 100%  Weight: 114 lb (51.7 kg)  Body mass index is 20.36 kg/m.        Physical Examination:   General appearance - well appearing, and in no distress  Mental status - alert, oriented to person, place, and time  Psych:  She has a normal mood and affect  Skin - warm and dry, normal color, no suspicious  lesions noted  Chest - effort normal, all lung fields clear to auscultation bilaterally  Heart - normal rate and regular rhythm  Neck:  midline trachea, no thyromegaly or nodules  Breasts - breasts appear normal, no suspicious masses, no skin or nipple changes or  axillary nodes, well healed left breast scars  Abdomen - soft, nontender, nondistended, no masses or organomegaly  Pelvic - VULVA: normal appearing vulva with no masses, tenderness or lesions   VAGINA: atrophic, no lesions   CERVIX: normal appearing cervix without discharge or lesions, no CMT  Thin prep pap is not indicated  UTERUS: uterus is felt to be normal size, shape, consistency and nontender   ADNEXA: No adnexal masses or tenderness noted.  Rectal - normal rectal, good sphincter tone, no masses felt.   Extremities:  No swelling or varicosities noted  Chaperone present for exam  No results found for this or any previous visit (from the past 24 hours).  Assessment & Plan:  1. GYN exam for high-risk Medicare patient (Primary) - Pap smear not longer indicated - Mammogram 12/2021.  She declines additional testing at this time. - Colonoscopy no longer indicated - Bone mineral density done with Dr. Nichole - lab work done with PCP, Dr. Nichole - vaccines reviewed/updated  2. Malignant neoplasm of upper-inner quadrant of left breast in female, estrogen receptor  positive (HCC) - on Raloxifene ordered by Dr. Nichole for both bone health and hx of breast cancer - taking calcium  and Vit D as well  3. Vaginal atrophy - not having any symptoms so does not need treatment at this time.     No orders of the defined types were placed in this encounter.   Meds: No orders of the defined types were placed in this encounter.   Follow-up: Return in about 1 year (around 07/11/2025).  For at least breast exam if not done by Dr. Nichole.  Total time with pt and documentation: 31 minutes specifically reviewing breast hx, treatment, screening  guidelines and follow up.    Ronal GORMAN Pinal, MD 07/13/2024 5:03 PM

## 2024-10-08 ENCOUNTER — Non-Acute Institutional Stay: Admitting: Internal Medicine

## 2024-10-08 ENCOUNTER — Encounter: Payer: Self-pay | Admitting: Internal Medicine

## 2024-10-08 VITALS — BP 124/74 | HR 68 | Temp 97.0°F | Ht 62.75 in | Wt 117.4 lb

## 2024-10-08 DIAGNOSIS — E782 Mixed hyperlipidemia: Secondary | ICD-10-CM

## 2024-10-08 DIAGNOSIS — R52 Pain, unspecified: Secondary | ICD-10-CM

## 2024-10-08 DIAGNOSIS — F4321 Adjustment disorder with depressed mood: Secondary | ICD-10-CM

## 2024-10-08 DIAGNOSIS — F5101 Primary insomnia: Secondary | ICD-10-CM

## 2024-10-08 DIAGNOSIS — I10 Essential (primary) hypertension: Secondary | ICD-10-CM

## 2024-10-08 DIAGNOSIS — K219 Gastro-esophageal reflux disease without esophagitis: Secondary | ICD-10-CM

## 2024-10-08 DIAGNOSIS — C50912 Malignant neoplasm of unspecified site of left female breast: Secondary | ICD-10-CM

## 2024-10-08 MED ORDER — MELATONIN 3 MG PO CAPS: 3.0000 mg | ORAL_CAPSULE | Freq: Every evening | ORAL | Status: AC | PRN

## 2024-10-11 NOTE — Progress Notes (Signed)
 "  Location:  Medical Illustrator of Service:  Clinic (12)  Provider:   Code Status:  Goals of Care:     10/08/2024    3:56 PM  Advanced Directives  Does Patient Have a Medical Advance Directive? Yes  Type of Estate Agent of [place];Living will;Out of facility DNR (pink MOST or yellow form)  Copy of Healthcare Power of Attorney in Chart? No - copy requested     Chief Complaint  Patient presents with   New Patient (Initial Visit)    HPI: Patient is a 89 y.o. female seen today for medical management of chronic diseases.    [place] H/o HTN,HLD, GERD,  H/o invasive ductal ca 07/2014, s/p lumpectomy, now off Tamoxifen   Urinary Incontinence Insomnia Recent Anxiety  Back Pain Discussed the use of AI scribe software for clinical note transcription with the patient, who gave verbal consent to proceed.  History of Present Illness   Jasmin Holloway is an 89 year old female who presents for a establish care  Weight loss She reports a 20-pound unintentional weight loss over the period of her husband's illness and death, which she attributes to stress and increased walking between her home and his care facility. Breast cancer History She has breast cancer treated with surgery and postoperative medication more than five years ago and no longer follows with oncology. She continues raloxifene. Back pain  She has intermittent back pain with heavy lifting. She uses tramadol  rarely and avoids it when driving. She uses Tylenol  PM occasionally for sleep, especially during periods of stress.  She has very thin skin that has bled with prior breast imaging, and now follows with a different clinician for breast exams and imaging.  She takes medication for blood pressure and cholesterol. She uses Tylenol  at times for sleep. 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 Bowel movements are usually  regular with good hydration, and she uses over-the-counter agents occasionally for constipation.  She is up to date on flu and COVID vaccines. Pneumonia vaccine status is uncertain. She completed shingles vaccination.  She has two sons, one local, and has grandchildren. She continues to drive, exercises regularly, and reports no recent falls.       Past Medical History:  Diagnosis Date   Aortic sclerosis    03/29/13 echo   Arthritis    HANDS,    Breast cancer (HCC) left   2015   Bursitis    hip   Cataracts, bilateral    Coronary artery disease    DR. BERRY IS PT'S CARDIOLOGIST   GERD (gastroesophageal reflux disease)    Headache    history of migraines   History of vertebral fracture    Hyperlipidemia    Hypertension    Mitral regurgitation    mild 03/29/2013 echo   PONV (postoperative nausea and vomiting)    PT HAD SEVERE NAUSEA WAKING UP IN RR AFTER BREAST LUMPECTOMY   Rectal bleeding    not sure exactly what date    Past Surgical History:  Procedure Laterality Date   APPENDECTOMY     BREAST LUMPECTOMY Left 08/2014   BREAST LUMPECTOMY WITH RADIOACTIVE SEED LOCALIZATION Left 08/05/2014   Procedure: SEED LOCALIZED LEFT BREAST LUMPECTOMY;  Surgeon: Morene Olives, MD;  Location: [facility];  Service: General;  Laterality: Left;   CATARACT EXTRACTION, BILATERAL     COLONOSCOPY     CORONARY ANGIOPLASTY  WITH STENT PLACEMENT  11/16/1998   stent to the LAD and PTCA side branch diagonal   DILATATION & CURETTAGE/HYSTEROSCOPY WITH MYOSURE N/A 03/29/2017   Procedure: DILATATION & CURETTAGE/HYSTEROSCOPY WITH MYOSURE, sampling of endometrium;  Surgeon: Cleotilde Ronal RAMAN, MD;  Location: [facility];  Service: Gynecology;  Laterality: N/A;   DILATION AND CURETTAGE OF UTERUS     EYE SURGERY     both cataracts   IR RADIOLOGIST EVAL & MGMT  05/18/2017   NM MYOCAR PERF WALL MOTION  07/13/2009   RE-EXCISION OF BREAST LUMPECTOMY Left 08/18/2014   Procedure: RE-EXCISION OF LEFT  BREAST LUMPECTOMY;  Surgeon: Morene Olives, MD;  Location: [facility];  Service: General;  Laterality: Left;   TONSILLECTOMY      Allergies[1]  Outpatient Encounter Medications as of 10/08/2024  Medication Sig   aspirin EC 81 MG tablet Take 81 mg by mouth every evening.    atorvastatin  (LIPITOR) 40 MG tablet Take 40 mg by mouth daily.   benazepril -hydrochlorthiazide (LOTENSIN  HCT) 10-12.5 MG tablet TAKE 1 TABLET BY MOUTH  DAILY   diphenhydramine-acetaminophen  (TYLENOL  PM) 25-500 MG TABS tablet Take 1 tablet by mouth at bedtime as needed.   glucosamine-chondroitin 500-400 MG tablet Take 1 tablet by mouth in the morning and at bedtime.   metoprolol  tartrate (LOPRESSOR ) 25 MG tablet TAKE 1 TABLET BY MOUTH 2  TIMES DAILY   pantoprazole  (PROTONIX ) 40 MG tablet Take 1 tablet (40 mg total) by mouth daily. (Patient taking differently: Take 40 mg by mouth at bedtime.)   raloxifene (EVISTA) 60 MG tablet Take 60 mg by mouth daily.   traMADol  (ULTRAM ) 50 MG tablet Take 1 tablet (50 mg total) by mouth every 6 (six) hours as needed.   UNABLE TO FIND Med Name: Syliva Collagen Peptides  6 tablets a day   Melatonin 3 MG CAPS Take 1 capsule (3 mg total) by mouth at bedtime as needed (Sleep).   sertraline (ZOLOFT) 25 MG tablet Take 25 mg by mouth daily. (Patient not taking: Reported on 10/08/2024)   [DISCONTINUED] bacitracin  500 UNIT/GM ointment Apply 1 Application topically 2 (two) times daily. (Patient not taking: Reported on 10/08/2024)   [DISCONTINUED] Biotin  5000 MCG CAPS Take 5,000 mcg by mouth daily.  (Patient not taking: Reported on 10/08/2024)   [DISCONTINUED] cholecalciferol  (VITAMIN D) 1000 UNITS tablet Take 1,000 Units by mouth daily. (Patient not taking: Reported on 10/08/2024)   [DISCONTINUED] Melatonin 3 MG CAPS Take 3 mg by mouth at bedtime as needed (Sleep).  (Patient not taking: Reported on 10/08/2024)   [DISCONTINUED] Misc Natural Products (OSTEO BI-FLEX JOINT SHIELD) TABS Take 2 tablets by mouth  daily. (Patient not taking: Reported on 10/08/2024)   [DISCONTINUED] Multiple Minerals-Vitamins (CALCIUM  CITRATE PLUS PO) Take 1 tablet by mouth daily.  (Patient not taking: Reported on 10/08/2024)   [DISCONTINUED] Multiple Vitamin (MULTIVITAMIN WITH MINERALS) TABS tablet Take 1 tablet by mouth daily. (Patient not taking: Reported on 10/08/2024)   [DISCONTINUED] nystatin  cream (MYCOSTATIN ) Apply 1 application topically 2 (two) times daily. Apply to affected area BID for up to 7 days. (Patient not taking: Reported on 10/08/2024)   [DISCONTINUED] Omega-3 Fatty Acids (FISH OIL) 1200 MG CPDR Take 1,200 mg by mouth daily.  (Patient not taking: Reported on 10/08/2024)   [DISCONTINUED] simvastatin (ZOCOR) 80 MG tablet Take 80 mg by mouth daily. (Patient not taking: Reported on 10/08/2024)   [DISCONTINUED] vitamin C  (ASCORBIC ACID ) 500 MG tablet Take 500 mg by mouth daily. (Patient not taking: Reported on 10/08/2024)  No facility-administered encounter medications on file as of 10/08/2024.    Review of Systems:  Review of Systems  Constitutional:  Negative for activity change and appetite change.  HENT: Negative.    Respiratory:  Negative for cough and shortness of breath.   Cardiovascular:  Negative for leg swelling.  Gastrointestinal:  Negative for constipation.  Genitourinary: Negative.   Musculoskeletal:  Negative for arthralgias, gait problem and myalgias.  Skin: Negative.   Neurological:  Negative for dizziness and weakness.  Psychiatric/Behavioral:  Negative for confusion, dysphoric mood and sleep disturbance.     Health Maintenance  Topic Date Due   Medicare Annual Wellness (AWV)  Never done   DTaP/Tdap/Td (1 - Tdap) Never done   Pneumococcal Vaccine: 50+ Years (1 of 2 - PCV) Never done   Mammogram  12/29/2022   COVID-19 Vaccine (5 - Mixed Product risk 2025-26 season) 11/17/2024   Influenza Vaccine  Completed   Bone Density Scan  Completed   Zoster Vaccines- Shingrix   Completed   Meningococcal B  Vaccine  Aged Out    Physical Exam: Vitals:   10/08/24 1519  BP: 124/74  Pulse: 68  Temp: (!) 97 F (36.1 C)  SpO2: 95%  Weight: 117 lb 6.4 oz (53.3 kg)  Height: 5' 2.75 (1.594 m)   Body mass index is 20.96 kg/m. Physical Exam Vitals reviewed.  Constitutional:      Appearance: Normal appearance.  HENT:     Head: Normocephalic.     Nose: Nose normal.     Mouth/Throat:     Mouth: Mucous membranes are moist.     Pharynx: Oropharynx is clear.  Eyes:     Pupils: Pupils are equal, round, and reactive to light.  Cardiovascular:     Rate and Rhythm: Normal rate and regular rhythm.     Pulses: Normal pulses.     Heart sounds: Normal heart sounds. No murmur heard. Pulmonary:     Effort: Pulmonary effort is normal.     Breath sounds: Normal breath sounds.  Abdominal:     General: Abdomen is flat. Bowel sounds are normal.     Palpations: Abdomen is soft.  Musculoskeletal:        General: No swelling.     Cervical back: Neck supple.  Skin:    General: Skin is warm.  Neurological:     General: No focal deficit present.     Mental Status: She is alert and oriented to person, place, and time.  Psychiatric:        Mood and Affect: Mood normal.        Thought Content: Thought content normal.     Labs reviewed: Basic Metabolic Panel: No results for input(s): NA, K, CL, CO2, GLUCOSE, BUN, CREATININE, CALCIUM , MG, PHOS, TSH in the last 8760 hours. Liver Function Tests: No results for input(s): AST, ALT, ALKPHOS, BILITOT, PROT, ALBUMIN in the last 8760 hours. No results for input(s): LIPASE, AMYLASE in the last 8760 hours. No results for input(s): AMMONIA in the last 8760 hours. CBC: No results for input(s): WBC, NEUTROABS, HGB, HCT, MCV, PLT in the last 8760 hours. Lipid Panel: No results for input(s): CHOL, HDL, LDLCALC, TRIG, CHOLHDL, LDLDIRECT in the last 8760 hours. Lab Results  Component Value Date    HGBA1C 6.2 (H) 03/24/2017    Procedures since last visit: No results found.  Assessment/Plan 1. Essential hypertension (Primary) Controlled  2. Mixed hyperlipidemia On statin  3. Hormone receptor positive malignant neoplasm of left breast (HCC) On  Raloxifen  .  Has done tamoxifen  before.  Does not follow with Dr. Baird anymore Does not want to do mammograms Continue follow-up with Dr. Cleotilde for breast exam  4. Grieving She has not started her Zoloft as prescribed by Dr. Nichole Encouraged her to start  5. Primary insomnia Try melatonin instead of Tylenol  PM  6. Pain Take tramadol  as needed  7. Gastroesophageal reflux disease without esophagitis On Protonix   8 Need TDAP and SHingles        Labs/tests ordered:  Labs before Next visit Next appt:  01/07/2025        [1]  Allergies Allergen Reactions   Codeine Nausea Only    Other reaction(s): GI Intolerance Nausea   "

## 2025-01-07 ENCOUNTER — Encounter: Admitting: Internal Medicine
